# Patient Record
Sex: Female | Born: 1965 | Race: White | Hispanic: No | Smoking: Current every day smoker
Health system: Southern US, Community
[De-identification: ages and names within clinical notes are randomized; demographics above are authoritative.]

## PROBLEM LIST (undated history)

## (undated) DIAGNOSIS — G8929 Other chronic pain: Secondary | ICD-10-CM

## (undated) DIAGNOSIS — K219 Gastro-esophageal reflux disease without esophagitis: Secondary | ICD-10-CM

## (undated) DIAGNOSIS — I70201 Unspecified atherosclerosis of native arteries of extremities, right leg: Secondary | ICD-10-CM

## (undated) DIAGNOSIS — R29898 Other symptoms and signs involving the musculoskeletal system: Secondary | ICD-10-CM

## (undated) DIAGNOSIS — I10 Essential (primary) hypertension: Secondary | ICD-10-CM

## (undated) DIAGNOSIS — M549 Dorsalgia, unspecified: Secondary | ICD-10-CM

## (undated) DIAGNOSIS — E079 Disorder of thyroid, unspecified: Secondary | ICD-10-CM

## (undated) DIAGNOSIS — F419 Anxiety disorder, unspecified: Secondary | ICD-10-CM

## (undated) HISTORY — PX: BACK SURGERY: SHX140

## (undated) HISTORY — PX: CHOLECYSTECTOMY: SHX55

---

## 2005-08-11 ENCOUNTER — Ambulatory Visit: Payer: Self-pay | Admitting: Cardiology

## 2007-06-12 ENCOUNTER — Ambulatory Visit: Payer: Self-pay | Admitting: Gastroenterology

## 2010-01-21 ENCOUNTER — Ambulatory Visit (HOSPITAL_COMMUNITY): Admission: RE | Admit: 2010-01-21 | Discharge: 2010-01-21 | Payer: Self-pay | Admitting: Neurosurgery

## 2010-01-21 ENCOUNTER — Encounter (INDEPENDENT_AMBULATORY_CARE_PROVIDER_SITE_OTHER): Payer: Self-pay | Admitting: Neurosurgery

## 2010-01-21 ENCOUNTER — Ambulatory Visit: Payer: Self-pay | Admitting: Vascular Surgery

## 2010-04-03 HISTORY — PX: THORACIC LAMINECTOMY: SHX96

## 2010-04-16 ENCOUNTER — Encounter (INDEPENDENT_AMBULATORY_CARE_PROVIDER_SITE_OTHER): Payer: Self-pay | Admitting: Neurosurgery

## 2010-04-16 ENCOUNTER — Ambulatory Visit (HOSPITAL_COMMUNITY): Admission: RE | Admit: 2010-04-16 | Discharge: 2010-04-17 | Payer: Self-pay | Admitting: Neurosurgery

## 2010-12-20 LAB — BASIC METABOLIC PANEL
BUN: 5 mg/dL — ABNORMAL LOW (ref 6–23)
CO2: 31 mEq/L (ref 19–32)
Calcium: 9.6 mg/dL (ref 8.4–10.5)
Chloride: 102 mEq/L (ref 96–112)
Creatinine, Ser: 0.53 mg/dL (ref 0.4–1.2)
GFR calc Af Amer: >60 mL/min (ref >60)
GFR calc non Af Amer: >60 mL/min (ref >60)
Glucose, Bld: 98 mg/dL (ref 70–99)
Potassium: 3.8 mEq/L (ref 3.5–5.1)
Sodium: 140 mEq/L (ref 135–145)

## 2010-12-20 LAB — URINALYSIS, MICROSCOPIC ONLY
Bilirubin Urine: NEGATIVE
Glucose, UA: NEGATIVE mg/dL
Hgb urine dipstick: NEGATIVE
Ketones, ur: NEGATIVE mg/dL
Leukocytes, UA: NEGATIVE
Nitrite: NEGATIVE
Protein, ur: NEGATIVE mg/dL
Specific Gravity, Urine: 1.01 (ref 1.005–1.030)
Urobilinogen, UA: 0.2 mg/dL (ref 0.0–1.0)
pH: 6.5 (ref 5.0–8.0)

## 2010-12-20 LAB — SURGICAL PCR SCREEN
MRSA, PCR: NEGATIVE
Staphylococcus aureus: NEGATIVE

## 2010-12-20 LAB — URINE CULTURE
Colony Count: NO GROWTH
Culture: NO GROWTH

## 2010-12-20 LAB — CBC
HCT: 46.4 % — ABNORMAL HIGH (ref 36.0–46.0)
Hemoglobin: 15.8 g/dL — ABNORMAL HIGH (ref 12.0–15.0)
MCH: 32.1 pg (ref 26.0–34.0)
MCHC: 34.1 g/dL (ref 30.0–36.0)
MCV: 94.2 fL (ref 78.0–100.0)
Platelets: 191 10*3/uL (ref 150–400)
RBC: 4.93 MIL/uL (ref 3.87–5.11)
RDW: 12.8 % (ref 11.5–15.5)
WBC: 10.4 10*3/uL (ref 4.0–10.5)

## 2010-12-20 LAB — ABO/RH: ABO/RH(D): A NEG

## 2010-12-20 LAB — TYPE AND SCREEN
ABO/RH(D): A NEG
Antibody Screen: NEGATIVE

## 2011-01-26 ENCOUNTER — Ambulatory Visit (HOSPITAL_COMMUNITY)
Admission: RE | Admit: 2011-01-26 | Discharge: 2011-01-26 | Disposition: A | Discharge status: Home / Self Care | Payer: 59 | Source: Ambulatory Visit | Attending: Cardiology | Admitting: Cardiology

## 2011-01-26 DIAGNOSIS — R29898 Other symptoms and signs involving the musculoskeletal system: Secondary | ICD-10-CM | POA: Insufficient documentation

## 2011-01-26 DIAGNOSIS — D320 Benign neoplasm of cerebral meninges: Secondary | ICD-10-CM | POA: Insufficient documentation

## 2011-01-26 DIAGNOSIS — I10 Essential (primary) hypertension: Secondary | ICD-10-CM | POA: Insufficient documentation

## 2011-01-26 DIAGNOSIS — G8220 Paraplegia, unspecified: Secondary | ICD-10-CM | POA: Insufficient documentation

## 2011-02-09 NOTE — Procedures (Signed)
  NAME:  Briana Mosley, LAMPRON            ACCOUNT NO.:  [id number]  MEDICAL RECORD NO.:  [id number]           PATIENT TYPE:  O  LOCATION:  [facility]                         FACILITY:  [facility]  PHYSICIAN:  Vonna Kotyk R. Jacinto Halim, MD       DATE OF BIRTH:  03/23/1966  DATE OF PROCEDURE:  01/26/2011 DATE OF DISCHARGE:                   PERIPHERAL VASCULAR INVASIVE PROCEDURE   PROCEDURES PERFORMED: 1. Abdominal aortogram. 2. Selective left and right renal arteriography.  INDICATIONS:  Ms. [name] is a pleasant 45 year old female who is being debilitated by a meningioma and has paraparesis and lower extremity weakness.  She had developed new onset of hypertension. Outpatient evaluation of renal arteries by [facility] had revealed high-grade bilateral renal artery stenosis.  She was referred to me for further evaluation.  After discussions of risks, benefits, alternatives, she consented proceeding with renal angiography and potential angioplasty.  Renal arteriography is being done to evaluate for renal vascular hypertension.  Abdominal aortogram.  Abdominal aortogram revealed presence of two renal arteries in the left and one renal artery on the right.  Selective right and left renal arteriogram.  Both the left renal arteries were selectively cannulated and angiography was performed. They were normal.  There was no pressure gradient.  There was no evidence of fibromuscular dysplasia.  Right renal artery.  Right renal artery is also widely patent.  There was no pressure gradient on engagement of right renal artery.  There is no evidence of fibromuscular distress.  Abdominal aortogram.  Abdominal aortogram revealed no evidence of abdominal aneurysm.  Aortoiliac bifurcation was widely patent.  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  IMPRESSION: 1. No evidence of renal artery stenosis. 2. Evaluation for renal tubular acidosis given decreased potassium is     indicated.  I will stop Benicar HCT and change  it to Benicar 20 mg     once a day and if her potassium continues to be low, she may need     further evaluation of the same.  I discussed the findings with Dr.     Doreen Beam.  The patient will follow up with him for further     management and evaluation.  TECHNIQUE OF PROCEDURE:  Under sterile precautions using a 6-French right femoral arterial access, a 5-French Omniflush catheter was advanced into the abdominal aorta and abdominal aortogram was performed. Catheter then pulled out of the body over a Versacore wire.  Using a short JR-4 guide catheter, selective renal arteriography was performed.  Catheter exchange was again done over a Versacore wire. Overall the patient tolerated the procedure well.  No complications.     Cristy Hilts. Jacinto Halim, MD     JRG/MEDQ  D:  01/26/2011  T:  01/26/2011  Job:  [id number]  cc:   Doreen Beam, MD  Electronically Signed by Yates Decamp MD on 02/09/2011 09:13:39 AM

## 2011-02-16 NOTE — Assessment & Plan Note (Signed)
NAME:  GOLDA, ZAVALZA             CHART#:  [id number]   DATE:  06/12/2007                       DOB:  1965-12-20   REQUESTING PHYSICIAN:  Brayton Mars.   REASON FOR CONSULTATION:  Weight loss and diarrhea.   HISTORY OF PRESENT ILLNESS:  Ms. Talerico is a 45 year old female who  has been having problems with diarrhea for 3 months. She has a  significant past surgical history of having her gallbladder removed in  2001. Following removal of her gallbladder she had occasional loose  stools. Now if she eats she goes to the bathroom and has watery stool.  She feels like food goes right through her as well as liquids. For this  reason she has decreased her food intake and has subsequently lost  weight. She is also going through a separation but denies that this is  causing any significant psychological stress. She denies any use of  aspirin or antiinflammatory drugs. She uses Imodium 2 to 3 times a day.  She has no abdominal pain associated with these loose stools. She has  lost anywhere between 10 to 20 pounds in the last 3 months. She wants to  eat but does not. She has not been started on any new medicines. She has  not been taking her thyroid medicine for the last 6 months. She denies  any heat intolerance.  She denies any money problems or any stress with  her living situation.   She has not had any rectal bleeding, nausea, vomiting, heartburn or  problem swallowing. She denies any travel, well water or sick contacts.  She has not had any bowel surgery.   PAST MEDICAL HISTORY:  1. Hypothyroidism.  2. Anxiety.  3. Depression.   PAST SURGICAL HISTORY:  Refer to the HPI.   FAMILY HISTORY:  She denies any family history of colon cancer, colon  polyps. Her mother had lung cancer and her father had a stroke.   SOCIAL HISTORY:  She is separated for the last 3 months and has a 60-  year-old son. 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  She denies any exposure to  patient. She smokes 2 packs a day. She may  have a beer 1 or 2 times a week. She grew up in [place], [place].   REVIEW OF SYSTEMS:  As per the HPI; otherwise all systems are negative.   PHYSICAL EXAMINATION:  VITAL SIGNS: Weight 137 pounds, height 5 feet 5  inches; BMI 22.8 (healthy). Temperature is 98.4, blood pressure 110/80,  pulse 72.  GENERAL APPEARANCE: In general she is in no apparent distress. Alert and  oriented x4.  HEENT: Her exam is normocephalic, atraumatic. Pupils equal and react to  light. Mouth, no oral lesions. Posterior oropharynx without erythema or  exudate. Her mouth has full range of motion; no lymphadenopathy.  LUNGS: Her lungs are clear to auscultation bilaterally.  CARDIOVASCULAR: Exam shows regular rhythm. No murmur, normal S1 and S2.  ABDOMEN: Bowel sounds are present, soft, nontender, nondistended. No  rebound or guarding. No hepatosplenomegaly, abdominal bruits or  pulsatile masses.  EXTREMITIES: The extremities are without clubbing, cyanosis or edema.   ASSESSMENT:  Ms. Hindle is a 45 year old female with chronic diarrhea.  The differential diagnosis includes thyroid disturbance, Clostridium  difficile colitis, giardiasis and a low likelihood of  inflammatory bowel  disease, celiac sprue or steatorrhea. She could have bile-salt induced  diarrhea. Thank you for allowing me to see Ms. Janyth Pupa in consultation;  my recommendations follow   RECOMMENDATIONS:  1. She is given instructions for the management of her diarrhea in      writing. She should continue the Imodium as needed or she is given      a prescription for Levsin 0.125 mg tablets which she may use 1 to 2      thirty minutes before meals and repeat every 4 hours. She is      cautioned that they may cause dry mouth, drowsiness and urinary      retention.  2. She is also asked to begin 2 Tums with meals which may cause      bloating or constipation.  3. She is asked to add a probiotic  (lactobacillus) daily.  4. She should call in 2 weeks to let me know how she is doing.  5. Today I will obtain the tissue transglutaminase, serum quantitative      immunoglobulin, and a TSH.  6. She needs stool for fat, white cells, Giardia antigen and C. diff      toxin.  7. She has a followup appointment in 1 month.       Kassie Mends, M.D.  Electronically Signed     SM/MEDQ  D:  06/12/2007  T:  06/13/2007  Job:  [id number]   cc:   Yetta Numbers

## 2011-11-29 ENCOUNTER — Inpatient Hospital Stay (HOSPITAL_COMMUNITY)
Admission: EM | Admit: 2011-11-29 | Discharge: 2011-12-07 | DRG: 689 | Disposition: A | Discharge status: Skilled Nursing Facility | Payer: Medicaid Other | Attending: Internal Medicine | Admitting: Internal Medicine

## 2011-11-29 ENCOUNTER — Emergency Department (HOSPITAL_COMMUNITY): Payer: Medicaid Other

## 2011-11-29 ENCOUNTER — Encounter (HOSPITAL_COMMUNITY): Payer: Self-pay | Admitting: *Deleted

## 2011-11-29 ENCOUNTER — Inpatient Hospital Stay (HOSPITAL_COMMUNITY): Payer: Medicaid Other

## 2011-11-29 DIAGNOSIS — T3795XA Adverse effect of unspecified systemic anti-infective and antiparasitic, initial encounter: Secondary | ICD-10-CM | POA: Diagnosis present

## 2011-11-29 DIAGNOSIS — Z993 Dependence on wheelchair: Secondary | ICD-10-CM

## 2011-11-29 DIAGNOSIS — I10 Essential (primary) hypertension: Secondary | ICD-10-CM | POA: Diagnosis present

## 2011-11-29 DIAGNOSIS — G8929 Other chronic pain: Secondary | ICD-10-CM | POA: Diagnosis present

## 2011-11-29 DIAGNOSIS — R269 Unspecified abnormalities of gait and mobility: Secondary | ICD-10-CM | POA: Diagnosis present

## 2011-11-29 DIAGNOSIS — G9340 Encephalopathy, unspecified: Secondary | ICD-10-CM | POA: Diagnosis present

## 2011-11-29 DIAGNOSIS — R29898 Other symptoms and signs involving the musculoskeletal system: Secondary | ICD-10-CM | POA: Diagnosis present

## 2011-11-29 DIAGNOSIS — R410 Disorientation, unspecified: Secondary | ICD-10-CM | POA: Diagnosis present

## 2011-11-29 DIAGNOSIS — R443 Hallucinations, unspecified: Secondary | ICD-10-CM | POA: Diagnosis present

## 2011-11-29 DIAGNOSIS — B952 Enterococcus as the cause of diseases classified elsewhere: Secondary | ICD-10-CM | POA: Diagnosis present

## 2011-11-29 DIAGNOSIS — Z79899 Other long term (current) drug therapy: Secondary | ICD-10-CM

## 2011-11-29 DIAGNOSIS — M549 Dorsalgia, unspecified: Secondary | ICD-10-CM | POA: Diagnosis present

## 2011-11-29 DIAGNOSIS — E039 Hypothyroidism, unspecified: Secondary | ICD-10-CM | POA: Diagnosis present

## 2011-11-29 DIAGNOSIS — N390 Urinary tract infection, site not specified: Principal | ICD-10-CM | POA: Diagnosis present

## 2011-11-29 DIAGNOSIS — E876 Hypokalemia: Secondary | ICD-10-CM | POA: Diagnosis present

## 2011-11-29 HISTORY — DX: Dorsalgia, unspecified: M54.9

## 2011-11-29 HISTORY — DX: Other symptoms and signs involving the musculoskeletal system: R29.898

## 2011-11-29 HISTORY — DX: Essential (primary) hypertension: I10

## 2011-11-29 HISTORY — DX: Disorder of thyroid, unspecified: E07.9

## 2011-11-29 HISTORY — DX: Other chronic pain: G89.29

## 2011-11-29 LAB — URINALYSIS, ROUTINE W REFLEX MICROSCOPIC
Glucose, UA: NEGATIVE mg/dL
Hgb urine dipstick: NEGATIVE
Nitrite: NEGATIVE
Protein, ur: NEGATIVE mg/dL

## 2011-11-29 LAB — BASIC METABOLIC PANEL
Potassium: 3.8 mEq/L (ref 3.5–5.1)
Sodium: 142 mEq/L (ref 135–145)

## 2011-11-29 LAB — DIFFERENTIAL
Basophils Absolute: 0 10*3/uL (ref 0.0–0.1)
Eosinophils Relative: 1 % (ref 0–5)
Lymphs Abs: 1.7 10*3/uL (ref 0.7–4.0)
Monocytes Absolute: 0.5 10*3/uL (ref 0.1–1.0)
Neutro Abs: 4.6 10*3/uL (ref 1.7–7.7)
Neutrophils Relative %: 67 % (ref 43–77)

## 2011-11-29 LAB — CBC
MCH: 31.8 pg (ref 26.0–34.0)
Platelets: 173 10*3/uL (ref 150–400)
WBC: 6.8 10*3/uL (ref 4.0–10.5)

## 2011-11-29 LAB — RAPID URINE DRUG SCREEN, HOSP PERFORMED
Benzodiazepines: POSITIVE — AB
Opiates: NOT DETECTED

## 2011-11-29 LAB — ETHANOL: Alcohol, Ethyl (B): <11 mg/dL (ref 0–11)

## 2011-11-29 MED ORDER — NICOTINE 7 MG/24HR TD PT24
7.0000 mg | MEDICATED_PATCH | Freq: Every day | TRANSDERMAL | Status: DC
  Administered 2011-11-30 – 2011-12-07 (×9): 7 mg via TRANSDERMAL
  Filled 2011-11-29 (×10): qty 1

## 2011-11-29 MED ORDER — ONDANSETRON HCL 4 MG PO TABS: 4.0000 mg | ORAL_TABLET | Freq: Four times a day (QID) | ORAL | Status: DC | PRN

## 2011-11-29 MED ORDER — ATENOLOL 25 MG PO TABS
50.0000 mg | ORAL_TABLET | Freq: Two times a day (BID) | ORAL | Status: DC
  Administered 2011-11-29 – 2011-11-30 (×3): 50 mg via ORAL
  Filled 2011-11-29 (×5): qty 1

## 2011-11-29 MED ORDER — ONDANSETRON HCL 4 MG/2ML IJ SOLN: 4.0000 mg | Freq: Four times a day (QID) | INTRAMUSCULAR | Status: DC | PRN

## 2011-11-29 MED ORDER — SODIUM CHLORIDE 0.9 % IV SOLN
INTRAVENOUS | Status: DC
  Administered 2011-11-29 – 2011-12-01 (×2): via INTRAVENOUS

## 2011-11-29 MED ORDER — OXYCODONE-ACETAMINOPHEN 5-325 MG PO TABS: 1.0000 | ORAL_TABLET | Freq: Three times a day (TID) | ORAL | Status: DC | PRN

## 2011-11-29 MED ORDER — SODIUM CHLORIDE 0.9 % IJ SOLN
INTRAMUSCULAR | Status: AC
  Administered 2011-11-29: 21:00:00
  Filled 2011-11-29: qty 3

## 2011-11-29 MED ORDER — ACETAMINOPHEN 650 MG RE SUPP: 650.0000 mg | Freq: Four times a day (QID) | RECTAL | Status: DC | PRN

## 2011-11-29 MED ORDER — ALPRAZOLAM 0.25 MG PO TABS
0.2500 mg | ORAL_TABLET | Freq: Two times a day (BID) | ORAL | Status: DC
  Administered 2011-11-29 – 2011-12-01 (×5): 0.25 mg via ORAL
  Filled 2011-11-29 (×5): qty 1

## 2011-11-29 MED ORDER — ACETAMINOPHEN 325 MG PO TABS
650.0000 mg | ORAL_TABLET | Freq: Four times a day (QID) | ORAL | Status: DC | PRN
  Administered 2011-12-05: 650 mg via ORAL
  Filled 2011-11-29: qty 2

## 2011-11-29 MED ORDER — ENOXAPARIN SODIUM 40 MG/0.4ML ~~LOC~~ SOLN
40.0000 mg | SUBCUTANEOUS | Status: DC
  Administered 2011-11-29 – 2011-11-30 (×2): 40 mg via SUBCUTANEOUS
  Filled 2011-11-29 (×2): qty 0.4

## 2011-11-29 MED ORDER — LISINOPRIL 10 MG PO TABS
20.0000 mg | ORAL_TABLET | Freq: Every day | ORAL | Status: DC
  Administered 2011-11-29 – 2011-12-04 (×6): 20 mg via ORAL
  Filled 2011-11-29 (×2): qty 2
  Filled 2011-11-29: qty 1
  Filled 2011-11-29: qty 2
  Filled 2011-11-29: qty 1
  Filled 2011-11-29 (×2): qty 2

## 2011-11-29 NOTE — ED Notes (Signed)
Pt began having hallucinations over the weekend. Pt has been confused and disoriented since. Resident of [facility]. Currently being treated for UTI

## 2011-11-29 NOTE — ED Notes (Signed)
Please call pt niece Alcario Drought [phone] when pt admitted to a room.

## 2011-11-29 NOTE — H&P (Signed)
PCP:   No primary provider on file.   Chief Complaint:  Hallucinations   HPI: This is a 46 y/o female who is currently at [facility] for physical rehabilitation after she had developed weakness in her limbs. She recently had back surgery done and is undergoing strength training.  Her niece is with her today and reports that approx 4 days ago, patient was found to have a possible UTI and started on ciprofloxacin.  Her niece reports that since then, her confusion has gotten significantly worse.  She has been seeing "shadows" infrequently.  She denies hearing any voices, suicidal/homicidal ideations.  She denies any headache, double vision, new limb weakness/numbess.  She was evaluated in the emergency room where basic work up including CT head was unrevealing.  Telepsychiatry consultation was provided and recommendations were for medical admission for observation.  It was not felt that patient's symptoms were psychiatric related.  No fever, cough, dysuria or seizure activity was reported.  Allergies:   Allergies  Allergen Reactions  . Bactrim   . Neurontin (Gabapentin)   . Sulfa Antibiotics       Past Medical History  Diagnosis Date  . Chronic back pain   . Hypertension   . Thyroid disease     hyperthyroidism  . Lower extremity weakness     Past Surgical History  Procedure Date  . Back surgery   . Cholecystectomy     Prior to Admission medications   Medication Sig Start Date End Date Taking? Authorizing Provider  ALPRAZolam Prudy Feeler) 0.5 MG tablet Take 0.5 mg by mouth 3 (three) times daily.   Yes Historical Provider, MD  atenolol (TENORMIN) 50 MG tablet Take 50 mg by mouth 2 (two) times daily.   Yes Historical Provider, MD  ciprofloxacin (CIPRO) 250 MG tablet Take 250 mg by mouth 2 (two) times daily. To take for 10 days, ending on 12/03/11   Yes Historical Provider, MD  cloNIDine (CATAPRES) 0.1 MG tablet Take 0.1 mg by mouth every 8 (eight) hours as needed. If blood pressure is  over 160   Yes Historical Provider, MD  Cyanocobalamin (VITAMIN B 12 PO) Take 1 tablet by mouth daily.   Yes Historical Provider, MD  lisinopril (PRINIVIL,ZESTRIL) 20 MG tablet Take 20 mg by mouth daily.   Yes Historical Provider, MD  losartan (COZAAR) 50 MG tablet Take 50 mg by mouth daily.   Yes Historical Provider, MD  oxyCODONE-acetaminophen (PERCOCET) 5-325 MG per tablet Take 1 tablet by mouth 3 (three) times daily as needed.   Yes Historical Provider, MD    Social History:  reports that she has been smoking.  She does not have any smokeless tobacco history on file. She reports that she does not drink alcohol or use illicit drugs.  No family history on file.  Review of Systems:  Constitutional: Denies fever, chills, diaphoresis, appetite change and fatigue.  HEENT: Denies photophobia, eye pain, redness, hearing loss, ear pain, congestion, sore throat, rhinorrhea, sneezing, mouth sores, trouble swallowing, neck pain, neck stiffness and tinnitus.   Respiratory: Denies SOB, DOE, cough, chest tightness,  and wheezing.   Cardiovascular: Denies chest pain, palpitations and leg swelling.  Gastrointestinal: Denies nausea, vomiting, abdominal pain, diarrhea, constipation, blood in stool and abdominal distention.  Genitourinary: Denies dysuria, urgency, frequency, hematuria, flank pain and difficulty urinating.  Musculoskeletal: Denies myalgias, back pain, joint swelling, arthralgias and gait problem.  Skin: Denies pallor, rash and wound.  Neurological: Denies dizziness, seizures, syncope, weakness, light-headedness, numbness and headaches.  Hematological: Denies adenopathy. Easy bruising, personal or family bleeding history  Psychiatric/Behavioral: Denies suicidal ideation, mood changes, confusion, nervousness, sleep disturbance and agitation   Physical Exam: Blood pressure 179/70, pulse 89, temperature 97.8 F (36.6 C), temperature source Oral, resp. rate 17, height 5\' 5"  (1.651 m), weight  64.864 kg (143 lb), last menstrual period 11/17/2011, SpO2 99.00%. General: No acute distress, laying in bed, alert and oriented x3,  HEENT: [place], AT, PERRLA Neck: supple Chest: CTA B Cardiac: S1, S2, RRR Abd: soft, NT, BS+ Neuro: strength is equal b/l, CN 2-12 intact, patient is at times slow to respond, and gets confused with dates, denies seeing any shadows at present.  Labs on Admission:  Results for orders placed during the hospital encounter of 11/29/11 (from the past 48 hour(s))  CBC     Status: Normal   Collection Time   11/29/11 10:01 AM      Component Value Range Comment   WBC 6.8  4.0 - 10.5 (K/uL)    RBC 4.31  3.87 - 5.11 (MIL/uL)    Hemoglobin 13.7  12.0 - 15.0 (g/dL)    HCT 16.1  09.6 - 04.5 (%)    MCV 91.6  78.0 - 100.0 (fL)    MCH 31.8  26.0 - 34.0 (pg)    MCHC 34.7  30.0 - 36.0 (g/dL)    RDW 40.9  81.1 - 91.4 (%)    Platelets 173  150 - 400 (K/uL)   DIFFERENTIAL     Status: Normal   Collection Time   11/29/11 10:01 AM      Component Value Range Comment   Neutrophils Relative 67  43 - 77 (%)    Neutro Abs 4.6  1.7 - 7.7 (K/uL)    Lymphocytes Relative 25  12 - 46 (%)    Lymphs Abs 1.7  0.7 - 4.0 (K/uL)    Monocytes Relative 7  3 - 12 (%)    Monocytes Absolute 0.5  0.1 - 1.0 (K/uL)    Eosinophils Relative 1  0 - 5 (%)    Eosinophils Absolute 0.1  0.0 - 0.7 (K/uL)    Basophils Relative 1  0 - 1 (%)    Basophils Absolute 0.0  0.0 - 0.1 (K/uL)   BASIC METABOLIC PANEL     Status: Abnormal   Collection Time   11/29/11 10:01 AM      Component Value Range Comment   Sodium 142  135 - 145 (mEq/L)    Potassium 3.8  3.5 - 5.1 (mEq/L)    Chloride 103  96 - 112 (mEq/L)    CO2 29  19 - 32 (mEq/L)    Glucose, Bld 97  70 - 99 (mg/dL)    BUN 16  6 - 23 (mg/dL)    Creatinine, Ser 7.82 (*) 0.50 - 1.10 (mg/dL)    Calcium 95.6  8.4 - 10.5 (mg/dL)    GFR calc non Af Amer >90  >90 (mL/min)    GFR calc Af Amer >90  >90 (mL/min)   ETHANOL     Status: Normal   Collection Time    11/29/11 10:51 AM      Component Value Range Comment   Alcohol, Ethyl (B) <11  0 - 11 (mg/dL)   URINALYSIS, ROUTINE W REFLEX MICROSCOPIC     Status: Abnormal   Collection Time   11/29/11 11:42 AM      Component Value Range Comment   Color, Urine YELLOW  YELLOW  APPearance CLEAR  CLEAR     Specific Gravity, Urine 1.025  1.005 - 1.030     pH 6.0  5.0 - 8.0     Glucose, UA NEGATIVE  NEGATIVE (mg/dL)    Hgb urine dipstick NEGATIVE  NEGATIVE     Bilirubin Urine NEGATIVE  NEGATIVE     Ketones, ur TRACE (*) NEGATIVE (mg/dL)    Protein, ur NEGATIVE  NEGATIVE (mg/dL)    Urobilinogen, UA 0.2  0.0 - 1.0 (mg/dL)    Nitrite NEGATIVE  NEGATIVE     Leukocytes, UA NEGATIVE  NEGATIVE  MICROSCOPIC NOT DONE ON URINES WITH NEGATIVE PROTEIN, BLOOD, LEUKOCYTES, NITRITE, OR GLUCOSE <1000 mg/dL.  URINE RAPID DRUG SCREEN (HOSP PERFORMED)     Status: Abnormal   Collection Time   11/29/11 11:42 AM      Component Value Range Comment   Opiates NONE DETECTED  NONE DETECTED     Cocaine NONE DETECTED  NONE DETECTED     Benzodiazepines POSITIVE (*) NONE DETECTED     Amphetamines NONE DETECTED  NONE DETECTED     Tetrahydrocannabinol NONE DETECTED  NONE DETECTED     Barbiturates NONE DETECTED  NONE DETECTED      Radiological Exams on Admission: Ct Head Wo Contrast  11/29/2011  *RADIOLOGY REPORT*  Clinical Data: Summation ends.  Insomnia.  Hyperthyroidism.  CT HEAD WITHOUT CONTRAST  Technique:  Contiguous axial images were obtained from the base of the skull through the vertex without contrast.  Comparison: None.  Findings: The brain has a normal appearance without evidence of malformation, old or acute small or large vessel infarction, mass lesion, hemorrhage, hydrocephalus or extra-axial collection.  The calvarium is unremarkable.  Sinuses, middle ears and mastoids are clear.  IMPRESSION: Normal head CT  Original Report Authenticated By: Thomasenia Sales, M.D.    Assessment/Plan Principal Problem:   *Hallucinations Active Problems:  Confusion  Hypertension  Back pain  Plan:  It is unclear as to the cause of the patient's mental status She will be admitted to a medical bed for observation We will check an MRI to rule out any underlying pathology which may be causing this We will also check TSH, ammonia Stop cipro, since her symptoms became worse after starting this If mental status does not improve, then we can consider neurology consultation She is on chronic benzos and opiates.  We will decrease the dose of benzos and monitor We will continue her antihypertensives and observe  Once mental status improves, she will return to [facility]  Time Spent on Admission:  Illianna Paschal [facility] Pager: [phone] 11/29/2011, 7:07 PM

## 2011-11-29 NOTE — ED Notes (Signed)
Telepsychiatric papers faxed to [facility] called by RN-spoke to [facility].

## 2011-11-29 NOTE — ED Provider Notes (Signed)
History   This chart was scribed for [employer]. Colon Branch, MD by Sofie Rower. The patient was seen in room APA14/APA14 and the patient's care was started at 10:42AM.    CSN: [id number]  Arrival date & time 11/29/11  7829   First MD Initiated Contact with Patient 11/29/11 [phone]      Chief Complaint  Patient presents with  . Altered Mental Status    (Consider location/radiation/quality/duration/timing/severity/associated sxs/prior treatment) HPI  Briana Mosley is a 46 y.o. female who, brought in by her niece, presents to the Emergency Department complaining of severe, episodic hallucinations onset four days ago with associated symptoms of loss of sleep. Pt caretaker states pt has "had visual hallucinations since Thursday, progressing worse to not knowing what day it is, whether it is day or night". Pt states she "is having hallucinations of people from the present and the past who she cannot speak to". Pt is a resident at [facility]. Pt uses a walker while doing therapy at [facility].   Pt has a hx of back problems. Pt started taking Cipro Thursday night. Pt is currently being treated for UTI.  Pt recently moved out of an apartment where she had been living with her son.     PCP is Dr. Sherril Croon  Past Medical History  Diagnosis Date  . Chronic back pain   . Hypertension   . Thyroid disease     hyperthyroidism  . Lower extremity weakness     Past Surgical History  Procedure Date  . Back surgery   . Cholecystectomy     No family history on file.  History  Substance Use Topics  . Smoking status: Current Everyday Smoker  . Smokeless tobacco: Not on file  . Alcohol Use: No    OB History    Grav Para Term Preterm Abortions TAB SAB Ect Mult Living                  Review of Systems  All other systems reviewed and are negative.    10 Systems reviewed and are negative for acute change except as noted in the HPI.  Allergies  Bactrim; Neurontin; and Sulfa  antibiotics  Home Medications   Current Outpatient Rx  Name Route Sig Dispense Refill  . ALPRAZOLAM 0.5 MG PO TABS Oral Take 0.5 mg by mouth 3 (three) times daily.    . ATENOLOL 50 MG PO TABS Oral Take 50 mg by mouth 2 (two) times daily.    Marland Kitchen CIPROFLOXACIN HCL 250 MG PO TABS Oral Take 250 mg by mouth 2 (two) times daily. To take for 10 days, ending on 12/03/11    . CLONIDINE HCL 0.1 MG PO TABS Oral Take 0.1 mg by mouth every 8 (eight) hours as needed. If blood pressure is over 160    . VITAMIN B 12 PO Oral Take 1 tablet by mouth daily.    Marland Kitchen LISINOPRIL 20 MG PO TABS Oral Take 20 mg by mouth daily.    Marland Kitchen LOSARTAN POTASSIUM 50 MG PO TABS Oral Take 50 mg by mouth daily.    . OXYCODONE-ACETAMINOPHEN 5-325 MG PO TABS Oral Take 1 tablet by mouth 3 (three) times daily as needed.      BP 171/73  Pulse 86  Temp(Src) 97.8 F (36.6 C) (Oral)  Resp 16  Ht 5\' 5"  (1.651 m)  Wt 143 lb (64.864 kg)  BMI 23.80 kg/m2  SpO2 100%  LMP 11/17/2011  Physical Exam  Nursing note and  vitals reviewed. Constitutional: She is oriented to person, place, and time. She appears well-developed and well-nourished.  HENT:  Head: Normocephalic and atraumatic.  Nose: Nose normal.  Eyes: Conjunctivae and EOM are normal. No scleral icterus.  Neck: Normal range of motion. Neck supple. No thyromegaly present.  Cardiovascular: Normal rate, regular rhythm and normal heart sounds.  Exam reveals no gallop and no friction rub.   No murmur heard. Pulmonary/Chest: Effort normal and breath sounds normal. No stridor. She has no wheezes. She has no rales. She exhibits no tenderness.  Abdominal: Soft. Bowel sounds are normal. She exhibits no distension. There is no tenderness. There is no rebound.  Musculoskeletal: Normal range of motion. She exhibits no edema.  Lymphadenopathy:    She has no cervical adenopathy.  Neurological: She is alert and oriented to person, place, and time. Coordination normal.  Skin: Skin is warm and  dry. No rash noted. No erythema.  Psychiatric:       Pt experiencing hallucinations. Pt states "I see people in the vents". Disoriented to place.     ED Course  Procedures (including critical care time)  DIAGNOSTIC STUDIES: Oxygen Saturation is 100% on room air, normal by my interpretation.    COORDINATION OF CARE:  Results for orders placed during the hospital encounter of 11/29/11  CBC      Component Value Range   WBC 6.8  4.0 - 10.5 (K/uL)   RBC 4.31  3.87 - 5.11 (MIL/uL)   Hemoglobin 13.7  12.0 - 15.0 (g/dL)   HCT 16.1  09.6 - 04.5 (%)   MCV 91.6  78.0 - 100.0 (fL)   MCH 31.8  26.0 - 34.0 (pg)   MCHC 34.7  30.0 - 36.0 (g/dL)   RDW 40.9  81.1 - 91.4 (%)   Platelets 173  150 - 400 (K/uL)  DIFFERENTIAL      Component Value Range   Neutrophils Relative 67  43 - 77 (%)   Neutro Abs 4.6  1.7 - 7.7 (K/uL)   Lymphocytes Relative 25  12 - 46 (%)   Lymphs Abs 1.7  0.7 - 4.0 (K/uL)   Monocytes Relative 7  3 - 12 (%)   Monocytes Absolute 0.5  0.1 - 1.0 (K/uL)   Eosinophils Relative 1  0 - 5 (%)   Eosinophils Absolute 0.1  0.0 - 0.7 (K/uL)   Basophils Relative 1  0 - 1 (%)   Basophils Absolute 0.0  0.0 - 0.1 (K/uL)  BASIC METABOLIC PANEL      Component Value Range   Sodium 142  135 - 145 (mEq/L)   Potassium 3.8  3.5 - 5.1 (mEq/L)   Chloride 103  96 - 112 (mEq/L)   CO2 29  19 - 32 (mEq/L)   Glucose, Bld 97  70 - 99 (mg/dL)   BUN 16  6 - 23 (mg/dL)   Creatinine, Ser 7.82 (*) 0.50 - 1.10 (mg/dL)   Calcium 95.6  8.4 - 10.5 (mg/dL)   GFR calc non Af Amer >90  >90 (mL/min)   GFR calc Af Amer >90  >90 (mL/min)  URINALYSIS, ROUTINE W REFLEX MICROSCOPIC      Component Value Range   Color, Urine YELLOW  YELLOW    APPearance CLEAR  CLEAR    Specific Gravity, Urine 1.025  1.005 - 1.030    pH 6.0  5.0 - 8.0    Glucose, UA NEGATIVE  NEGATIVE (mg/dL)   Hgb urine dipstick NEGATIVE  NEGATIVE  Bilirubin Urine NEGATIVE  NEGATIVE    Ketones, ur TRACE (*) NEGATIVE (mg/dL)   Protein, ur  NEGATIVE  NEGATIVE (mg/dL)   Urobilinogen, UA 0.2  0.0 - 1.0 (mg/dL)   Nitrite NEGATIVE  NEGATIVE    Leukocytes, UA NEGATIVE  NEGATIVE   ETHANOL      Component Value Range   Alcohol, Ethyl (B) <11  0 - 11 (mg/dL)  URINE RAPID DRUG SCREEN (HOSP PERFORMED)      Component Value Range   Opiates NONE DETECTED  NONE DETECTED    Cocaine NONE DETECTED  NONE DETECTED    Benzodiazepines POSITIVE (*) NONE DETECTED    Amphetamines NONE DETECTED  NONE DETECTED    Tetrahydrocannabinol NONE DETECTED  NONE DETECTED    Barbiturates NONE DETECTED  NONE DETECTED    Ct Head Wo Contrast  11/29/2011  *RADIOLOGY REPORT*  Clinical Data: Summation ends.  Insomnia.  Hyperthyroidism.  CT HEAD WITHOUT CONTRAST  Technique:  Contiguous axial images were obtained from the base of the skull through the vertex without contrast.  Comparison: None.  Findings: The brain has a normal appearance without evidence of malformation, old or acute small or large vessel infarction, mass lesion, hemorrhage, hydrocephalus or extra-axial collection.  The calvarium is unremarkable.  Sinuses, middle ears and mastoids are clear.  IMPRESSION: Normal head CT  Original Report Authenticated By: Thomasenia Sales, M.D.      10:50AM- EDP at bedside discusses treatment plan.  1455 2:56 PM:  T/C to Dr. Kerry Hough  , case discussed, including:  HPI, pertinent PM/SHx, VS/PE, dx testing, ED course and treatment.  Asked that we arrange for tele psych consult. Based on recommendation, hospitalist will admit for observation.   MDM  Patient is a resident of a nursing facility for rehab after back surgery which has left her with signficiant weakness in her arms and legs. Since Thursday she has been having visual hallucinations. No previous psychiatric history. Labs are unremarkable. Pt stable in ED with no significant deterioration in condition.[id number] Care/dispostion to Dr. Doylene Canard. Awaiting tele-psych recommendations.     I personally performed the services  described in this documentation, which was scribed in my presence. The recorded information has been reviewed and considered.   MDM Reviewed: nursing note and vitals Interpretation: labs     Nicoletta Dress. Colon Branch, MD 11/29/11 1645

## 2011-11-30 LAB — BASIC METABOLIC PANEL
CO2: 27 mEq/L (ref 19–32)
Chloride: 106 mEq/L (ref 96–112)
Creatinine, Ser: 0.43 mg/dL — ABNORMAL LOW (ref 0.50–1.10)
Glucose, Bld: 93 mg/dL (ref 70–99)

## 2011-11-30 LAB — CBC
HCT: 35.6 % — ABNORMAL LOW (ref 36.0–46.0)
MCH: 31.2 pg (ref 26.0–34.0)
MCV: 91 fL (ref 78.0–100.0)
RDW: 12.6 % (ref 11.5–15.5)
WBC: 6.1 10*3/uL (ref 4.0–10.5)

## 2011-11-30 LAB — TSH: TSH: 1.307 u[IU]/mL (ref 0.350–4.500)

## 2011-11-30 MED ORDER — POTASSIUM CHLORIDE CRYS ER 20 MEQ PO TBCR
40.0000 meq | EXTENDED_RELEASE_TABLET | Freq: Once | ORAL | Status: AC
  Administered 2011-11-30: 40 meq via ORAL
  Filled 2011-11-30: qty 2

## 2011-11-30 MED ORDER — NICOTINE 7 MG/24HR TD PT24
MEDICATED_PATCH | TRANSDERMAL | Status: AC
  Filled 2011-11-30: qty 1

## 2011-11-30 MED ORDER — SODIUM CHLORIDE 0.9 % IV BOLUS (SEPSIS)
500.0000 mL | Freq: Once | INTRAVENOUS | Status: AC
  Administered 2011-11-30: 500 mL via INTRAVENOUS

## 2011-11-30 MED ORDER — SODIUM CHLORIDE 0.9 % IJ SOLN
INTRAMUSCULAR | Status: AC
  Administered 2011-11-30
  Filled 2011-11-30: qty 3

## 2011-11-30 MED ORDER — HYDRALAZINE HCL 20 MG/ML IJ SOLN
20.0000 mg | Freq: Four times a day (QID) | INTRAMUSCULAR | Status: DC | PRN
  Administered 2011-11-30 – 2011-12-03 (×4): 20 mg via INTRAVENOUS
  Filled 2011-11-30 (×4): qty 1

## 2011-11-30 NOTE — Progress Notes (Signed)
Patient admitted from [facility]- anticipate return at d/c- full assessment to follow-  Reece Levy, MSW, [employer] [phone]

## 2011-11-30 NOTE — Progress Notes (Signed)
Subjective: Patient remains confused, still having visual hallucinations.  No reported seizure activity, she was incontinent of urine, no shortness of breath, no chest pain, no fever  Objective: Vital signs in last 24 hours: Temp:  [98 F (36.7 C)-98.6 F (37 C)] 98.4 F (36.9 C) (02/26 0700) Pulse Rate:  [72-100] 85  (02/26 0700) Resp:  [14-20] 20  (02/26 0700) BP: (115-187)/(70-95) 156/76 mmHg (02/26 0600) SpO2:  [97 %-100 %] 99 % (02/26 0700) Weight:  [60.3 kg (132 lb 15 oz)] 60.3 kg (132 lb 15 oz) (02/26 0600) Weight change:     Intake/Output from previous day: 02/25 0701 - 02/26 0700 In: 933 [P.O.:240; I.V.:693] Out: 50 [Urine:50]     Physical Exam: General: Alert, awake, oriented x3, in no acute distress, she is slow to respond, confused. HEENT: No bruits, no goiter. Heart: Regular rate and rhythm, without murmurs, rubs, gallops. Lungs: Clear to auscultation bilaterally. Abdomen: Soft, nontender, nondistended, positive bowel sounds. Extremities: No clubbing cyanosis or edema with positive pedal pulses. Neuro: Grossly intact, nonfocal.    Lab Results: Basic Metabolic Panel:  Basename 11/30/11 0432 11/29/11 1001  NA 140 142  K 3.4* 3.8  CL 106 103  CO2 27 29  GLUCOSE 93 97  BUN 11 16  CREATININE 0.43* 0.49*  CALCIUM 8.5 10.3  MG -- --  PHOS -- --   Liver Function Tests: No results found for this basename: AST:2,ALT:2,ALKPHOS:2,BILITOT:2,PROT:2,ALBUMIN:2 in the last 72 hours No results found for this basename: LIPASE:2,AMYLASE:2 in the last 72 hours  Basename 11/29/11 2248  AMMONIA 8*   CBC:  Basename 11/30/11 0432 11/29/11 1001  WBC 6.1 6.8  NEUTROABS -- 4.6  HGB 12.2 13.7  HCT 35.6* 39.5  MCV 91.0 91.6  PLT 143* 173   Cardiac Enzymes: No results found for this basename: CKTOTAL:3,CKMB:3,CKMBINDEX:3,TROPONINI:3 in the last 72 hours BNP: No results found for this basename: PROBNP:3 in the last 72 hours D-Dimer: No results found for this  basename: DDIMER:2 in the last 72 hours CBG: No results found for this basename: GLUCAP:6 in the last 72 hours Hemoglobin A1C: No results found for this basename: HGBA1C in the last 72 hours Fasting Lipid Panel: No results found for this basename: CHOL,HDL,LDLCALC,TRIG,CHOLHDL,LDLDIRECT in the last 72 hours Thyroid Function Tests: No results found for this basename: TSH,T4TOTAL,FREET4,T3FREE,THYROIDAB in the last 72 hours Anemia Panel: No results found for this basename: VITAMINB12,FOLATE,FERRITIN,TIBC,IRON,RETICCTPCT in the last 72 hours Coagulation: No results found for this basename: LABPROT:2,INR:2 in the last 72 hours Urine Drug Screen: Drugs of Abuse     Component Value Date/Time   LABOPIA NONE DETECTED 11/29/2011 1142   COCAINSCRNUR NONE DETECTED 11/29/2011 1142   LABBENZ POSITIVE* 11/29/2011 1142   AMPHETMU NONE DETECTED 11/29/2011 1142   THCU NONE DETECTED 11/29/2011 1142   LABBARB NONE DETECTED 11/29/2011 1142    Alcohol Level:  Basename 11/29/11 1051  ETH <11   Urinalysis:  Basename 11/29/11 1142  COLORURINE YELLOW  LABSPEC 1.025  PHURINE 6.0  GLUCOSEU NEGATIVE  HGBUR NEGATIVE  BILIRUBINUR NEGATIVE  KETONESUR TRACE*  PROTEINUR NEGATIVE  UROBILINOGEN 0.2  NITRITE NEGATIVE  LEUKOCYTESUR NEGATIVE    Recent Results (from the past 240 hour(s))  MRSA PCR SCREENING     Status: Normal   Collection Time   11/29/11 11:45 PM      Component Value Range Status Comment   MRSA by PCR NEGATIVE  NEGATIVE  Final     Studies/Results: Dg Chest 1 View  11/30/2011  *RADIOLOGY REPORT*  Clinical  Data: Altered mental status; weakness.  CHEST - 1 VIEW  Comparison: Chest radiograph performed 04/14/2010  Findings: The lungs are well-aerated and clear.  There is no evidence of focal opacification, pleural effusion or pneumothorax.  The heart is normal in size; the mediastinal contour is within normal limits.  No acute osseous abnormalities are seen.  IMPRESSION: No acute cardiopulmonary  process seen.  Original Report Authenticated By: Tonia Ghent, M.D.   Ct Head Wo Contrast  11/29/2011  *RADIOLOGY REPORT*  Clinical Data: Summation ends.  Insomnia.  Hyperthyroidism.  CT HEAD WITHOUT CONTRAST  Technique:  Contiguous axial images were obtained from the base of the skull through the vertex without contrast.  Comparison: None.  Findings: The brain has a normal appearance without evidence of malformation, old or acute small or large vessel infarction, mass lesion, hemorrhage, hydrocephalus or extra-axial collection.  The calvarium is unremarkable.  Sinuses, middle ears and mastoids are clear.  IMPRESSION: Normal head CT  Original Report Authenticated By: Thomasenia Sales, M.D.   Mr Brain Wo Contrast  11/30/2011  *RADIOLOGY REPORT*  Clinical Data: Altered mental status with confusion.  Difficulty standing.  Hypertension.  MRI HEAD WITHOUT CONTRAST  Technique:  Multiplanar, multiecho pulse sequences of the brain and surrounding structures were obtained according to standard protocol without intravenous contrast.  Comparison: CT head earlier in the day.  MR head 01/22/2011.  Findings: There is no evidence for acute infarction, intracranial hemorrhage, mass lesion, hydrocephalus, or extra-axial fluid. There is no atrophy or significant white matter disease.  Normal midline structures. Widely patent intracranial vasculature.  Sinuses, mastoids and orbits all appear unremarkable.  IMPRESSION: Negative.  Original Report Authenticated By: Elsie Stain, M.D.    Medications: Scheduled Meds:   . ALPRAZolam  0.25 mg Oral BID  . atenolol  50 mg Oral BID  . enoxaparin  40 mg Subcutaneous Q24H  . lisinopril  20 mg Oral Daily  . nicotine  7 mg Transdermal Daily  . sodium chloride  500 mL Intravenous Once  . sodium chloride       Continuous Infusions:   . sodium chloride 75 mL/hr at 11/30/11 0700   PRN Meds:.acetaminophen, acetaminophen, ondansetron (ZOFRAN) IV, ondansetron,  oxyCODONE-acetaminophen  Assessment/Plan:  Principal Problem:  *Hallucinations Active Problems:  Confusion  Hypertension  Back pain  Plan:  This lady was admitted from the nursing home with confusion and hallucinations for the last 4-5 days.  She was recently started on cipro 5 days ago for presumed UTI.  After she was started on cipro, her mental status got worse.  She was admitted to the hospital for observation and further work up.  Telepsychiatry did not feel that her symptoms were psych related. CT head/MRI brain/Chest xray are negative.  She did have a urinalysis done in the ED which did not show signs of infection.  She has also been on cipro for the last 5 days, so I do not expect her u/a or culture to be positive. Her cipro was discontinued as this may have been causing her symptoms, but she has not had significant improvement with this. Her ammonia is normal, and TSH is currently pending. Her benzo's and opiates were decreased as well, but she has been on these chronically and are less likely to be causing her current symptoms.  We will ask neurology for further recommendations.   LOS: 1 day   Agustina Witzke [facility] Pager: [phone] 11/30/2011, 10:18 AM

## 2011-12-01 ENCOUNTER — Inpatient Hospital Stay (HOSPITAL_COMMUNITY)
Admit: 2011-12-01 | Discharge: 2011-12-01 | Disposition: A | Discharge status: Home / Self Care | Payer: Medicaid Other | Attending: Neurology | Admitting: Neurology

## 2011-12-01 MED ORDER — ATENOLOL 25 MG PO TABS
100.0000 mg | ORAL_TABLET | Freq: Two times a day (BID) | ORAL | Status: DC
  Administered 2011-12-01 – 2011-12-05 (×9): 100 mg via ORAL
  Filled 2011-12-01 (×8): qty 4
  Filled 2011-12-01: qty 1

## 2011-12-01 MED ORDER — VITAMIN B-1 100 MG PO TABS
100.0000 mg | ORAL_TABLET | Freq: Every day | ORAL | Status: DC
  Administered 2011-12-01 – 2011-12-07 (×7): 100 mg via ORAL
  Filled 2011-12-01 (×7): qty 1

## 2011-12-01 MED ORDER — POTASSIUM CHLORIDE CRYS ER 20 MEQ PO TBCR
40.0000 meq | EXTENDED_RELEASE_TABLET | Freq: Once | ORAL | Status: AC
  Administered 2011-12-01: 40 meq via ORAL
  Filled 2011-12-01: qty 2

## 2011-12-01 NOTE — Evaluation (Signed)
Physical Therapy Evaluation Patient Details Name: Briana Mosley MRN: [id number] DOB: July 26, 1966 Today's Date: 12/01/2011  Problem List:  Patient Active Problem List  Diagnoses  . Hallucinations  . Confusion  . Hypertension  . Back pain    Past Medical History:  Past Medical History  Diagnosis Date  . Chronic back pain   . Hypertension   . Thyroid disease     hyperthyroidism  . Lower extremity weakness    Past Surgical History:  Past Surgical History  Procedure Date  . Back surgery   . Cholecystectomy     PT Assessment/Plan/Recommendation PT Assessment Clinical Impression Statement: pt coopertive but anxious...strength is WNL but has strong extensor tone in both LEs which is uncontrollable in standing...she states that she is able to stand with better control when she has her shoes on...unfortunately, she has no shoes here in the hospital..Marland KitchenI am going to recommend that we continue PT in the rehab setting where they have the appropriate equipment to work with a pt having these problems PT Recommendation/Assessment: All further PT needs can be met in the next venue of care PT Problem List: Decreased coordination;Decreased balance;Decreased mobility;Decreased cognition;Impaired tone PT Therapy Diagnosis : Difficulty walking;Abnormality of gait PT Recommendation Follow Up Recommendations: Skilled nursing facility Equipment Recommended: Defer to next venue PT Goals     PT Evaluation Precautions/Restrictions  Precautions Precautions: Fall Required Braces or Orthoses: No Restrictions Weight Bearing Restrictions: No Prior Functioning  Home Living Type of Home: Skilled Nursing Facility Additional Comments: pt does not seem to recall what her residence was prior to SNF Prior Function Level of Independence: Needs assistance with gait;Needs assistance with tranfers Driving: No Vocation: On disability Cognition Cognition Arousal/Alertness: Awake/alert Overall  Cognitive Status: Appears within functional limits for tasks assessed Orientation Level: Oriented to person;Oriented to place;Oriented to time Cognition - Other Comments: pt is very nervous and feels confused as to what is going on around her Sensation/Coordination Sensation Light Touch: Appears Intact Stereognosis: Not tested Hot/Cold: Not tested Proprioception: Impaired by gross assessment Coordination Gross Motor Movements are Fluid and Coordinated: No Fine Motor Movements are Fluid and Coordinated: No Coordination and Movement Description: intention tremor LUE, strong extensor tone in LEs Heel Shin Test: unable to perform  Extremity Assessment RUE Assessment RUE Assessment: Within Functional Limits LUE Assessment LUE Assessment: Within Functional Limits RLE Assessment RLE Assessment: Within Functional Limits LLE Assessment LLE Assessment: Within Functional Limits Mobility (including Balance) Bed Mobility Bed Mobility: Yes Supine to Sit: 5: Supervision Supine to Sit Details (indicate cue type and reason): has difficulty controlling LEs to get to sitting position Sit to Supine: 5: Supervision Transfers Transfers: Yes Sit to Stand: 2: Max assist;With upper extremity assist Sit to Stand Details (indicate cue type and reason): able to push up from bed, but needs assist to maintain flexed knees at push off and then is unable to bear weight on RLE due to severe tone Stand to Sit: 2: Max assist Ambulation/Gait Ambulation/Gait: No (unable-very fearful of sliding on floor-shoes unavailable) Stairs: No Wheelchair Mobility Wheelchair Mobility: No  Posture/Postural Control Posture/Postural Control: No significant limitations Balance Balance Assessed: Yes Static Sitting Balance Static Sitting - Level of Assistance: 6: Modified independent (Device/Increase time) Dynamic Sitting Balance Dynamic Sitting - Level of Assistance: 6: Modified independent (Device/Increase time) Static  Standing Balance Static Standing - Level of Assistance: 3: Mod assist Exercise    End of Session PT - End of Session Equipment Utilized During Treatment: Gait belt Activity Tolerance: Patient tolerated treatment  well Patient left: in bed;with call bell in reach;with bed alarm set Nurse Communication: Mobility status for transfers;Mobility status for ambulation General Behavior During Session: [facility] for tasks performed Cognition: [facility] for tasks performed  Konrad Penta 12/01/2011, 1:17 PM

## 2011-12-01 NOTE — Consult Note (Signed)
NAMEJOSELLE, DEEDS            ACCOUNT NO.:  [id number]  MEDICAL RECORD NO.:  [id number]  LOCATION:  A329                          FACILITY:  [facility]  PHYSICIAN:  Zhana Jeangilles A. Gerilyn Pilgrim, M.D. DATE OF BIRTH:  10/31/1965  DATE OF CONSULTATION: DATE OF DISCHARGE:                                CONSULTATION   HISTORY OF PRESENT ILLNESS:  This is a 46 year old white female who presented from [facility] with a relatively acute encephalopathy over the last few days.  The history is somewhat unclear.  She has had difficulties ambulating.  Ever since, she had surgery in 2011.  The history was obtained from the niece who I did talk to over the phone. She apparently has not recovered since her surgery.  She apparently has had a some type of cyst in the back region, requiring surgery.  Since then, she has not been able to ambulate.  She essentially sits in the wheelchair.  She goes from the bed to the wheelchair on a daily basis. The family helps out with her ADLs.  She apparently went to [facility] about 2 months ago in January to get some physical therapy.  She has been doing well, has good mental status at baseline.  She is usually lucid and coherent.  She, however, developed a bladder infection, diagnosed after she started having some hallucinations last Thursday. She was started on Cipro.  It appears that concurrently she also had some changes in her antihypertensive medications.  The confusion became more progressive.  She became more agitated and having increased hallucinations.  She was taken to the hospital and has had a rather extensive evaluation, which has been mostly unrevealing.  She has imaging both with head CT scan and MRI, both have been unremarkable. Extensive labs including repeat urinalysis has been negative.  She continues to have some confusion.  Nurse reports that she has had some improvement; however, on today.  PHYSICAL EXAMINATION:  GENERAL:  She is still crying,  unclear why she is crying.  She is solely in towards the left. HEENT EVALUATION:  Head is normocephalic, atraumatic. NECK:  Supple. ABDOMEN:  Soft. EXTREMITIES:  No significant edema. NEUROLOGIC:  Mentation:  She is awake.  She is oriented to month and year.  She knows she is in the hospital.  She is really not oriented to medical condition as to why she is in the hospital.  She can provide very little history regarding her medical condition.  Speech is fine. There is no dysarthria.  Cranial nerve evaluation:  Pupils are equal, round, and reactive to light and accommodation.  Extraocular movements are full.  There is no nystagmus.  Visual fields are intact.  Facial muscle strength is symmetric.  Tongue is midline.  Uvula midline. Shoulder shrugs are normal.  Motor examination does show global weakness throughout.  She has 4+ weakness proximally right upper extremity.  Left upper extremity is also about 4.  She is weaker in the legs with the proximal strength graded as 3/5.  Distal strength is also graded as 3/5 in the legs.  This is on dorsiflexion.  Some of the testing is limited because of lack of cooperation.  Reflexes are essentially  absent in the legs except the right patella, which is trace.  Plantars are equivocal. Reflexes are 1+ in the upper extremities.  Sensation is somewhat unreliable, but she seems responds well to temperature and light touch bilaterally.  Coordination:  There is no dysmetria.  I see no tremors, no parkinsonism.  MRI is reviewed in person and shows essentially no lesions with normal scan both on diffusion imaging, FLAIR imaging, and also T1 weighted sequences.  Other labs were reviewed and essentially unremarkable.  ASSESSMENT: 1. Acute encephalopathy, unclear etiology.  I think the picture,     however, favors a toxic metabolic process especially given the     initial urine analysis, which is positive for increased leukocytes,     suspect this is a  most likely etiology. 2. Long-term gait impairment.  The examination suggest a peripheral     problem either radiculopathy or neuropathy.  We will need to get     some additional information from her old records.  RECOMMENDATIONS:  I think, we may consider doing a spinal tap.  We will therefore hold the Lovenox and consented the patient for spinal tap in the morning.  Additional recommendations will include labs for vitamin B12 level, RPR, thyroid function test, ANA, and sed rate.  An EEG will also be obtained.  We will go ahead and start the patient on thiamine. We will continue to follow the patient with you.  Thank you for this consultation.     Aviv Rota A. Gerilyn Pilgrim, M.D.     KAD/MEDQ  D:  12/01/2011  T:  12/01/2011  Job:  [id number]

## 2011-12-01 NOTE — Consult Note (Signed)
Reason for Consult: Referring Physician:   [name] is an 46 y.o. female.  HPI:   Past Medical History  Diagnosis Date  . Chronic back pain   . Hypertension   . Thyroid disease     hyperthyroidism  . Lower extremity weakness     Past Surgical History  Procedure Date  . Back surgery   . Cholecystectomy     History reviewed. No pertinent family history.  Social History:  reports that she has been smoking.  She does not have any smokeless tobacco history on file. She reports that she does not drink alcohol or use illicit drugs.  Allergies:  Allergies  Allergen Reactions  . Bactrim   . Neurontin (Gabapentin)   . Sulfa Antibiotics     Medications:  Prior to Admission medications   Medication Sig Start Date End Date Taking? Authorizing Provider  ALPRAZolam Prudy Feeler) 0.5 MG tablet Take 0.5 mg by mouth 3 (three) times daily.   Yes Historical Provider, MD  atenolol (TENORMIN) 50 MG tablet Take 50 mg by mouth 2 (two) times daily.   Yes Historical Provider, MD  ciprofloxacin (CIPRO) 250 MG tablet Take 250 mg by mouth 2 (two) times daily. To take for 10 days, ending on 12/03/11   Yes Historical Provider, MD  cloNIDine (CATAPRES) 0.1 MG tablet Take 0.1 mg by mouth every 8 (eight) hours as needed. If blood pressure is over 160   Yes Historical Provider, MD  Cyanocobalamin (VITAMIN B 12 PO) Take 1 tablet by mouth daily.   Yes Historical Provider, MD  lisinopril (PRINIVIL,ZESTRIL) 20 MG tablet Take 20 mg by mouth daily.   Yes Historical Provider, MD  losartan (COZAAR) 50 MG tablet Take 50 mg by mouth daily.   Yes Historical Provider, MD  oxyCODONE-acetaminophen (PERCOCET) 5-325 MG per tablet Take 1 tablet by mouth 3 (three) times daily as needed.   Yes Historical Provider, MD   Scheduled Meds:   . ALPRAZolam  0.25 mg Oral BID  . atenolol  100 mg Oral BID  . enoxaparin  40 mg Subcutaneous Q24H  . lisinopril  20 mg Oral Daily  . nicotine  7 mg Transdermal Daily  . potassium  chloride  40 mEq Oral Once  . potassium chloride  40 mEq Oral Once  . sodium chloride      . DISCONTD: atenolol  50 mg Oral BID   Continuous Infusions:   . DISCONTD: sodium chloride 75 mL/hr at 12/01/11 0800   PRN Meds:.acetaminophen, acetaminophen, hydrALAZINE, ondansetron (ZOFRAN) IV, ondansetron, DISCONTD: oxyCODONE-acetaminophen   Results for orders placed during the hospital encounter of 11/29/11 (from the past 48 hour(s))  CBC     Status: Normal   Collection Time   11/29/11 10:01 AM      Component Value Range Comment   WBC 6.8  4.0 - 10.5 (K/uL)    RBC 4.31  3.87 - 5.11 (MIL/uL)    Hemoglobin 13.7  12.0 - 15.0 (g/dL)    HCT 78.2  95.6 - 21.3 (%)    MCV 91.6  78.0 - 100.0 (fL)    MCH 31.8  26.0 - 34.0 (pg)    MCHC 34.7  30.0 - 36.0 (g/dL)    RDW 08.6  57.8 - 46.9 (%)    Platelets 173  150 - 400 (K/uL)   DIFFERENTIAL     Status: Normal   Collection Time   11/29/11 10:01 AM      Component Value Range Comment   Neutrophils Relative  67  43 - 77 (%)    Neutro Abs 4.6  1.7 - 7.7 (K/uL)    Lymphocytes Relative 25  12 - 46 (%)    Lymphs Abs 1.7  0.7 - 4.0 (K/uL)    Monocytes Relative 7  3 - 12 (%)    Monocytes Absolute 0.5  0.1 - 1.0 (K/uL)    Eosinophils Relative 1  0 - 5 (%)    Eosinophils Absolute 0.1  0.0 - 0.7 (K/uL)    Basophils Relative 1  0 - 1 (%)    Basophils Absolute 0.0  0.0 - 0.1 (K/uL)   BASIC METABOLIC PANEL     Status: Abnormal   Collection Time   11/29/11 10:01 AM      Component Value Range Comment   Sodium 142  135 - 145 (mEq/L)    Potassium 3.8  3.5 - 5.1 (mEq/L)    Chloride 103  96 - 112 (mEq/L)    CO2 29  19 - 32 (mEq/L)    Glucose, Bld 97  70 - 99 (mg/dL)    BUN 16  6 - 23 (mg/dL)    Creatinine, Ser 1.61 (*) 0.50 - 1.10 (mg/dL)    Calcium 09.6  8.4 - 10.5 (mg/dL)    GFR calc non Af Amer >90  >90 (mL/min)    GFR calc Af Amer >90  >90 (mL/min)   ETHANOL     Status: Normal   Collection Time   11/29/11 10:51 AM      Component Value Range Comment    Alcohol, Ethyl (B) <11  0 - 11 (mg/dL)   URINALYSIS, ROUTINE W REFLEX MICROSCOPIC     Status: Abnormal   Collection Time   11/29/11 11:42 AM      Component Value Range Comment   Color, Urine YELLOW  YELLOW     APPearance CLEAR  CLEAR     Specific Gravity, Urine 1.025  1.005 - 1.030     pH 6.0  5.0 - 8.0     Glucose, UA NEGATIVE  NEGATIVE (mg/dL)    Hgb urine dipstick NEGATIVE  NEGATIVE     Bilirubin Urine NEGATIVE  NEGATIVE     Ketones, ur TRACE (*) NEGATIVE (mg/dL)    Protein, ur NEGATIVE  NEGATIVE (mg/dL)    Urobilinogen, UA 0.2  0.0 - 1.0 (mg/dL)    Nitrite NEGATIVE  NEGATIVE     Leukocytes, UA NEGATIVE  NEGATIVE  MICROSCOPIC NOT DONE ON URINES WITH NEGATIVE PROTEIN, BLOOD, LEUKOCYTES, NITRITE, OR GLUCOSE <1000 mg/dL.  URINE RAPID DRUG SCREEN (HOSP PERFORMED)     Status: Abnormal   Collection Time   11/29/11 11:42 AM      Component Value Range Comment   Opiates NONE DETECTED  NONE DETECTED     Cocaine NONE DETECTED  NONE DETECTED     Benzodiazepines POSITIVE (*) NONE DETECTED     Amphetamines NONE DETECTED  NONE DETECTED     Tetrahydrocannabinol NONE DETECTED  NONE DETECTED     Barbiturates NONE DETECTED  NONE DETECTED    AMMONIA     Status: Abnormal   Collection Time   11/29/11 10:48 PM      Component Value Range Comment   Ammonia 8 (*) 11 - 60 (umol/L)   TSH     Status: Normal   Collection Time   11/29/11 10:53 PM      Component Value Range Comment   TSH 1.307  0.350 - 4.500 (uIU/mL)   MRSA PCR  SCREENING     Status: Normal   Collection Time   11/29/11 11:45 PM      Component Value Range Comment   MRSA by PCR NEGATIVE  NEGATIVE    BASIC METABOLIC PANEL     Status: Abnormal   Collection Time   11/30/11  4:32 AM      Component Value Range Comment   Sodium 140  135 - 145 (mEq/L)    Potassium 3.4 (*) 3.5 - 5.1 (mEq/L)    Chloride 106  96 - 112 (mEq/L)    CO2 27  19 - 32 (mEq/L)    Glucose, Bld 93  70 - 99 (mg/dL)    BUN 11  6 - 23 (mg/dL)    Creatinine, Ser 9.81 (*) 0.50  - 1.10 (mg/dL)    Calcium 8.5  8.4 - 10.5 (mg/dL)    GFR calc non Af Amer >90  >90 (mL/min)    GFR calc Af Amer >90  >90 (mL/min)   CBC     Status: Abnormal   Collection Time   11/30/11  4:32 AM      Component Value Range Comment   WBC 6.1  4.0 - 10.5 (K/uL)    RBC 3.91  3.87 - 5.11 (MIL/uL)    Hemoglobin 12.2  12.0 - 15.0 (g/dL)    HCT 19.1 (*) 47.8 - 46.0 (%)    MCV 91.0  78.0 - 100.0 (fL)    MCH 31.2  26.0 - 34.0 (pg)    MCHC 34.3  30.0 - 36.0 (g/dL)    RDW 29.5  62.1 - 30.8 (%)    Platelets 143 (*) 150 - 400 (K/uL)     Dg Chest 1 View  11/30/2011  *RADIOLOGY REPORT*  Clinical Data: Altered mental status; weakness.  CHEST - 1 VIEW  Comparison: Chest radiograph performed 04/14/2010  Findings: The lungs are well-aerated and clear.  There is no evidence of focal opacification, pleural effusion or pneumothorax.  The heart is normal in size; the mediastinal contour is within normal limits.  No acute osseous abnormalities are seen.  IMPRESSION: No acute cardiopulmonary process seen.  Original Report Authenticated By: Tonia Ghent, M.D.   Ct Head Wo Contrast  11/29/2011  *RADIOLOGY REPORT*  Clinical Data: Summation ends.  Insomnia.  Hyperthyroidism.  CT HEAD WITHOUT CONTRAST  Technique:  Contiguous axial images were obtained from the base of the skull through the vertex without contrast.  Comparison: None.  Findings: The brain has a normal appearance without evidence of malformation, old or acute small or large vessel infarction, mass lesion, hemorrhage, hydrocephalus or extra-axial collection.  The calvarium is unremarkable.  Sinuses, middle ears and mastoids are clear.  IMPRESSION: Normal head CT  Original Report Authenticated By: Thomasenia Sales, M.D.   Mr Brain Wo Contrast  11/30/2011  *RADIOLOGY REPORT*  Clinical Data: Altered mental status with confusion.  Difficulty standing.  Hypertension.  MRI HEAD WITHOUT CONTRAST  Technique:  Multiplanar, multiecho pulse sequences of the brain and  surrounding structures were obtained according to standard protocol without intravenous contrast.  Comparison: CT head earlier in the day.  MR head 01/22/2011.  Findings: There is no evidence for acute infarction, intracranial hemorrhage, mass lesion, hydrocephalus, or extra-axial fluid. There is no atrophy or significant white matter disease.  Normal midline structures. Widely patent intracranial vasculature.  Sinuses, mastoids and orbits all appear unremarkable.  IMPRESSION: Negative.  Original Report Authenticated By: Elsie Stain, M.D.    Review of Systems  Unable  to perform ROS: mental status change   Blood pressure 184/84, pulse 75, temperature 98.4 F (36.9 C), temperature source Oral, resp. rate 16, height 5\' 5"  (1.651 m), weight 60.3 kg (132 lb 15 oz), last menstrual period 11/17/2011, SpO2 100.00%. Physical Exam  Assessment/Plan: See dictation  Briana Mosley 12/01/2011, 8:55 AM

## 2011-12-01 NOTE — Progress Notes (Signed)
Report given to V. Dildy, LPN on Dept 161.  Pt will be transferred to room 329 via bed.  Pt is alert and oriented to self and place.  Pt's niece Alcario Drought called and she was updated on the plan for the pt and she was made aware of transfer to room 329.  Pt's belongings taken to new room with pt.

## 2011-12-01 NOTE — Progress Notes (Signed)
EEG performed, portable.

## 2011-12-01 NOTE — Progress Notes (Signed)
Subjective: This lady was admitted with hallucinations from [facility]. She herself cannot really tell much about why she was at [facility] We do know that in April 2012 she underwent abdominal aortogram and selective left and right renal arteriography for new onset of hypertension. 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           Physical Exam: Blood pressure 184/84, pulse 75, temperature 98.4 F (36.9 C), temperature source Oral, resp. rate 16, height 5\' 5"  (1.651 m), weight 60.3 kg (132 lb 15 oz), last menstrual period 11/17/2011, SpO2 100.00%. She looks systemically well but she appears somewhat delirious. She is slightly fidgety. She does not appear to have any focal neurological signs. Her cognition is rather poor and her attention span is also poor. Heart sounds are present and normal. Lung fields are clear. Abdomen is soft nontender.   Investigations:  Recent Results (from the past 240 hour(s))  MRSA PCR SCREENING     Status: Normal   Collection Time   11/29/11 11:45 PM      Component Value Range Status Comment   MRSA by PCR NEGATIVE  NEGATIVE  Final      Basic Metabolic Panel:  Basename 11/30/11 0432 11/29/11 1001  NA 140 142  K 3.4* 3.8  CL 106 103  CO2 27 29  GLUCOSE 93 97  BUN 11 16  CREATININE 0.43* 0.49*  CALCIUM 8.5 10.3  MG -- --  PHOS -- --       CBC:  Basename 11/30/11 0432 11/29/11 1001  WBC 6.1 6.8  NEUTROABS -- 4.6  HGB 12.2 13.7  HCT 35.6* 39.5  MCV 91.0 91.6  PLT 143* 173    Dg Chest 1 View  11/30/2011  *RADIOLOGY REPORT*  Clinical Data: Altered mental status; weakness.  CHEST - 1 VIEW  Comparison: Chest radiograph performed 04/14/2010  Findings: The lungs are well-aerated and clear.  There is no evidence of focal opacification, pleural effusion or pneumothorax.  The heart is normal in size; the mediastinal contour is within normal limits.  No acute osseous abnormalities are  seen.  IMPRESSION: No acute cardiopulmonary process seen.  Original Report Authenticated By: Tonia Ghent, M.D.   Ct Head Wo Contrast  11/29/2011  *RADIOLOGY REPORT*  Clinical Data: Summation ends.  Insomnia.  Hyperthyroidism.  CT HEAD WITHOUT CONTRAST  Technique:  Contiguous axial images were obtained from the base of the skull through the vertex without contrast.  Comparison: None.  Findings: The brain has a normal appearance without evidence of malformation, old or acute small or large vessel infarction, mass lesion, hemorrhage, hydrocephalus or extra-axial collection.  The calvarium is unremarkable.  Sinuses, middle ears and mastoids are clear.  IMPRESSION: Normal head CT  Original Report Authenticated By: Thomasenia Sales, M.D.   Mr Brain Wo Contrast  11/30/2011  *RADIOLOGY REPORT*  Clinical Data: Altered mental status with confusion.  Difficulty standing.  Hypertension.  MRI HEAD WITHOUT CONTRAST  Technique:  Multiplanar, multiecho pulse sequences of the brain and surrounding structures were obtained according to standard protocol without intravenous contrast.  Comparison: CT head earlier in the day.  MR head 01/22/2011.  Findings: There is no evidence for acute infarction, intracranial hemorrhage, mass lesion, hydrocephalus, or extra-axial fluid. There is no atrophy or significant white matter disease.  Normal midline structures. Widely patent intracranial vasculature.  Sinuses, mastoids and orbits all appear unremarkable.  IMPRESSION: Negative.  Original Report Authenticated  By: Elsie Stain, M.D.      Medications: I have reviewed the patient's current medications.  Impression: 1. Delirium of unclear etiology. 2. Hypertension, uncontrolled. 3. Hypokalemia.     Plan: 1. Replete potassium. 2. Discontinue Foley catheter. 3. Discontinue IV fluids. 4. Discontinue opioids. 5. Physical therapy evaluation. 6. Agree with neurology consultation.     LOS: 2 days   Wilson Singer Pager [phone]  12/01/2011, 8:42 AM

## 2011-12-02 LAB — COMPREHENSIVE METABOLIC PANEL
AST: 16 U/L (ref 0–37)
Alkaline Phosphatase: 83 U/L (ref 39–117)
BUN: 10 mg/dL (ref 6–23)
CO2: 25 mEq/L (ref 19–32)
Chloride: 104 mEq/L (ref 96–112)
Creatinine, Ser: 0.47 mg/dL — ABNORMAL LOW (ref 0.50–1.10)
GFR calc non Af Amer: >90 mL/min (ref >90)
Total Bilirubin: 0.9 mg/dL (ref 0.3–1.2)

## 2011-12-02 LAB — CBC
HCT: 39.8 % (ref 36.0–46.0)
Hemoglobin: 13.9 g/dL (ref 12.0–15.0)
MCV: 89.8 fL (ref 78.0–100.0)
RBC: 4.43 MIL/uL (ref 3.87–5.11)
WBC: 7.1 10*3/uL (ref 4.0–10.5)

## 2011-12-02 LAB — ANTI-NUCLEAR AB-TITER (ANA TITER): ANA Titer 1: NEGATIVE

## 2011-12-02 MED ORDER — SODIUM CHLORIDE 0.9 % IJ SOLN
INTRAMUSCULAR | Status: AC
  Administered 2011-12-02: 10 mL
  Filled 2011-12-02: qty 3

## 2011-12-02 NOTE — Progress Notes (Signed)
Subjective: This lady was admitted with hallucinations from [facility]. She herself cannot really tell much about why she was at [facility]. We do know that in April 2012 she underwent abdominal aortogram and selective left and right renal arteriography for new onset of hypertension. 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 She remains confused today. She does not know where she is. Appreciate Dr. Ronal Fear input.           Physical Exam: Blood pressure 129/69, pulse 82, temperature 98 F (36.7 C), temperature source Oral, resp. rate 18, height 5\' 5"  (1.651 m), weight 60.3 kg (132 lb 15 oz), last menstrual period 11/17/2011, SpO2 100.00%. She looks systemically well but she appears somewhat delirious. She is slightly fidgety. She does not appear to have any focal neurological signs. Her cognition is rather poor and her attention span is also poor. Heart sounds are present and normal. Lung fields are clear. Abdomen is soft nontender.   Investigations:  Recent Results (from the past 240 hour(s))  MRSA PCR SCREENING     Status: Normal   Collection Time   11/29/11 11:45 PM      Component Value Range Status Comment   MRSA by PCR NEGATIVE  NEGATIVE  Final      Basic Metabolic Panel:  Basename 12/02/11 0538 11/30/11 0432  NA 139 140  K 4.0 3.4*  CL 104 106  CO2 25 27  GLUCOSE 88 93  BUN 10 11  CREATININE 0.47* 0.43*  CALCIUM 9.4 8.5  MG -- --  PHOS -- --       CBC:  Basename 12/02/11 0538 11/30/11 0432  WBC 7.1 6.1  NEUTROABS -- --  HGB 13.9 12.2  HCT 39.8 35.6*  MCV 89.8 91.0  PLT 169 143*        Medications: I have reviewed the patient's current medications.  Impression: 1. Delirium of unclear etiology. 2. Hypertension.      Plan: 1. I would agree with lumbar puncture on this lady. 2. Discontinue benzodiazepines in case this is contributing to her delirium. Urine culture has been  negative. We can repeat a urine culture again.     LOS: 3 days   Wilson Singer Pager [phone]  12/02/2011, 10:07 AM

## 2011-12-02 NOTE — Progress Notes (Signed)
Patient BP 129/69, HR 82

## 2011-12-02 NOTE — Progress Notes (Signed)
Patient BP 208/72, HR 68, prn order of hydralazine 20mg  IV given, will recheck BP

## 2011-12-02 NOTE — Progress Notes (Signed)
Patient BP 199/73, HR 74, Patient just received Atenolol 100mg  po, will recheck BP to see if effective

## 2011-12-03 ENCOUNTER — Inpatient Hospital Stay (HOSPITAL_COMMUNITY): Payer: Medicaid Other

## 2011-12-03 LAB — CSF CELL COUNT WITH DIFFERENTIAL: Tube #: 4

## 2011-12-03 LAB — BODY FLUID CELL COUNT WITH DIFFERENTIAL

## 2011-12-03 MED ORDER — SODIUM CHLORIDE 0.9 % IJ SOLN
INTRAMUSCULAR | Status: AC
  Administered 2011-12-03: 10 mL
  Filled 2011-12-03: qty 3

## 2011-12-03 NOTE — Progress Notes (Signed)
Patient incontinent of urine, tried several times to obtain urine but was unsuccessful, called Dr. Venetia Constable, new order given to do in & out cath, of urine output obtained, patient tolerated well

## 2011-12-03 NOTE — Procedures (Signed)
NAMEDHARA, SCHEPP            ACCOUNT NO.:  [id number]  MEDICAL RECORD NO.:  [id number]  LOCATION:  A329                          FACILITY:  [facility]  PHYSICIAN:  Esra Frankowski A. Gerilyn Pilgrim, M.D. DATE OF BIRTH:  April 20, 1966  DATE OF PROCEDURE:  12/01/2011 DATE OF DISCHARGE:                             EEG INTERPRETATION   Electroencephalography.  INDICATION:  A 46 year old who presents with altered mental status and confusion.  The study is being done to evaluate for nonconvulsive seizures.  MEDICATIONS:  Alprazolam, lisinopril, NicoDerm, potassium, Lovenox.  ANALYSIS:  A 16-channel recording using standard 10/20 measurements is conducted for 21 minutes.  There was a well-formed posterior dominant rhythm of 10 Hz, which attenuates with eye opening.  There is beta activity observed in frontal areas.  Awake and drowsy activities are recorded.  There is no focal or lateralized slowing.  There is no epileptiform activity.  IMPRESSION:  Normal recording of awake and drowsy states.     Kahlen Boyde A. Gerilyn Pilgrim, M.D.     KAD/MEDQ  D:  12/03/2011  T:  12/03/2011  Job:  [id number]

## 2011-12-03 NOTE — Progress Notes (Addendum)
Called to room that pt was in floor.  Upon arrival in room pt was sitting on buttocks on right side of bed missing with blankets.  Bed alarm not sounding.  Writer assessed pt's ROM and pt denied any pain and no injuries observed to skin areas.  NT obtained VS and pt was assisted back to bed with staff member assistance.  Pt reassessed in bed and no acute findings.  Bed in low position, bed alarm on, and side rails up and call bell within reach.  Pt in no distress and in stable condition.   MD made aware, received no further orders.

## 2011-12-03 NOTE — Progress Notes (Signed)
Family member present and made aware of fall earlier with no apparent injury.

## 2011-12-03 NOTE — Progress Notes (Signed)
We received a second request for PT eval today.  I saw pt on 12-01-11 and did an eval.  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  She has been receiving PT in SNF.    The problem that she has is chronic in nature and in fact we need a rehab setting to effectively work with her (mats, parallel bars, proper footwear, etc).   Her acute problems are really not related to our realm and when she worked with me she was very cooperative, able to follow all directions and focus on task.  There may be issues of which I am not aware, and if so, please let me know as I would be more than happy to see her again and try to assist in any way possible.  My pager is [phone].  Many thanks.

## 2011-12-03 NOTE — Progress Notes (Signed)
Briana Mosley, RESSEL            ACCOUNT NO.:  [id number]  MEDICAL RECORD NO.:  [id number]  LOCATION:  A329                          FACILITY:  [facility]  PHYSICIAN:  Ashan Cueva A. Gerilyn Pilgrim, M.D. DATE OF BIRTH:  02-26-1966  DATE OF PROCEDURE: DATE OF DISCHARGE:                                PROGRESS NOTE   Nurses reported that the patient continues to have some hallucinations and confusion.  She appears to have had some improvement, however, in terms of the drowsiness.  PHYSICAL EXAMINATION:  HEENT:  Head is normocephalic, atraumatic. NECK: Supple. ABDOMEN:  Soft. EXTREMITIES:  No significant edema. BACK:  Shows mid low thoracic surgical incisional scar. 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  Cognition today, she thinks she is in [place], she knows she is in the hospital.  She does still have some nonsensical conversations from time to time.  She does follow commands well.  No dysarthria is noted.  She continues to have significant leg weakness, hip flexion on the right 4-, on the left 3.  EEG is normal.  ASSESSMENT AND PLAN:  Acute confusional state of unclear etiology.  She did present initially with urinary tract infection which possibly could be cause of this.  Otherwise nothing else uncovered.  I think it is reasonable to proceed with a spinal tap.  This should be arranged for today hopefully.     Blossom Crume A. Gerilyn Pilgrim, M.D.     KAD/MEDQ  D:  12/03/2011  T:  12/03/2011  Job:  [id number]

## 2011-12-03 NOTE — Progress Notes (Signed)
Patient BP 206/81, HR 76, prn Hydralazine 20mg  IV given for elevated BP

## 2011-12-03 NOTE — Procedures (Signed)
Preprocedure Dx: Altered Mental Status  Postprocedure Dx: Altered Mental Status Procedure:  Fluoroscopically guided lumbar puncture Radiologist:  Tyron Russell Anesthesia:  1.5 ml of 1% lidocaine Specimen:  8 ml clear colorless CSF EBL:   None Opening pressure: 20 mm H2O Complications: None

## 2011-12-03 NOTE — Progress Notes (Signed)
Subjective: Interval History:  Objective: Vital signs in last 24 hours: Temp:  [97.3 F (36.3 C)-98.5 F (36.9 C)] 98.5 F (36.9 C) (03/01 0647) Pulse Rate:  [74-95] 95  (03/01 0647) Resp:  [17-18] 18  (03/01 0647) BP: (144-206)/(66-81) 145/69 mmHg (03/01 0647) SpO2:  [95 %-98 %] 97 % (03/01 0647)  Intake/Output from previous day: 02/28 0701 - 03/01 0700 In: 340 [P.O.:340] Out: -  Intake/Output this shift:   Nutritional status: Cardiac    Lab Results:  [facility] 12/02/11 0538  WBC 7.1  HGB 13.9  HCT 39.8  PLT 169  NA 139  K 4.0  CL 104  CO2 25  GLUCOSE 88  BUN 10  CREATININE 0.47*  CALCIUM 9.4  LABA1C --   Lipid Panel No results found for this basename: CHOL,TRIG,HDL,CHOLHDL,VLDL,LDLCALC in the last 72 hours  Studies/Results: No results found.  Medications:  Scheduled Meds:   . atenolol  100 mg Oral BID  . lisinopril  20 mg Oral Daily  . nicotine  7 mg Transdermal Daily  . sodium chloride      . thiamine  100 mg Oral Daily  . DISCONTD: ALPRAZolam  0.25 mg Oral BID   Continuous Infusions:  PRN Meds:.acetaminophen, acetaminophen, hydrALAZINE, ondansetron (ZOFRAN) IV, ondansetron   Assessment/Plan: See dictation   LOS: 4 days   Jahiem Franzoni

## 2011-12-03 NOTE — Progress Notes (Signed)
Patient admitted from [facility]- plan transfer back once medically stable- Reece Levy, MSW, [place] [phone]

## 2011-12-03 NOTE — Progress Notes (Signed)
Subjective: This lady I believe is actually improving. She is more alert today. She was able to tell me that she is at [facility].           Physical Exam: Blood pressure 123/71, pulse 80, temperature 97.3 F (36.3 C), temperature source Oral, resp. rate 18, height 5\' 5"  (1.651 m), weight 60.3 kg (132 lb 15 oz), last menstrual period 11/17/2011, SpO2 97.00%. She looks systemically well but she appears somewhat delirious. She is slightly fidgety. She does not appear to have any focal neurological signs. He alert this is better today. Heart sounds are present and normal. Lung fields are clear. Abdomen is soft nontender.   Investigations:  Recent Results (from the past 240 hour(s))  MRSA PCR SCREENING     Status: Normal   Collection Time   11/29/11 11:45 PM      Component Value Range Status Comment   MRSA by PCR NEGATIVE  NEGATIVE  Final      Basic Metabolic Panel:  Basename 12/02/11 0538  NA 139  K 4.0  CL 104  CO2 25  GLUCOSE 88  BUN 10  CREATININE 0.47*  CALCIUM 9.4  MG --  PHOS --       CBC:  Basename 12/02/11 0538  WBC 7.1  NEUTROABS --  HGB 13.9  HCT 39.8  MCV 89.8  PLT 169        Medications: I have reviewed the patient's current medications.  Impression: 1. Delirium of unclear etiology, improving. 2. Hypertension.      Plan: 1. She did have lumbar puncture today and we will await the results. 2. Physical therapy evaluation and treatment.     LOS: 4 days   Wilson Singer Pager [phone]  12/03/2011, 11:59 AM

## 2011-12-04 LAB — GRAM STAIN

## 2011-12-04 MED ORDER — HEPARIN SODIUM (PORCINE) 5000 UNIT/ML IJ SOLN
5000.0000 [IU] | Freq: Three times a day (TID) | INTRAMUSCULAR | Status: DC
  Administered 2011-12-04 – 2011-12-07 (×9): 5000 [IU] via SUBCUTANEOUS
  Filled 2011-12-04 (×9): qty 1

## 2011-12-04 MED ORDER — LISINOPRIL 10 MG PO TABS
40.0000 mg | ORAL_TABLET | Freq: Every day | ORAL | Status: DC
  Administered 2011-12-04: 20 mg via ORAL
  Administered 2011-12-06: 40 mg via ORAL
  Filled 2011-12-04: qty 2
  Filled 2011-12-04: qty 4
  Filled 2011-12-04 (×2): qty 2

## 2011-12-04 NOTE — Progress Notes (Signed)
NAMEREBECA, VALDIVIA            ACCOUNT NO.:  [id number]  MEDICAL RECORD NO.:  [id number]  LOCATION:  A329                          FACILITY:  [facility]  PHYSICIAN:  Tad Fancher A. Gerilyn Pilgrim, M.D. DATE OF BIRTH:  [birth year]  DATE OF PROCEDURE: DATE OF DISCHARGE:                                PROGRESS NOTE   ADDENDUM:  Additional labs on the patient; vitamin B12 441, RPR nonreactive, homocysteine 7.1, ANA negative, and ESR 9.     Metztli Sachdev A. Gerilyn Pilgrim, M.D.     KAD/MEDQ  D:  12/03/2011  T:  12/04/2011  Job:  [id number]

## 2011-12-04 NOTE — Progress Notes (Signed)
Subjective: This lady is delirious again today and appears to have gone backwards in my opinion. Investigations so far been really negative including CSF. We have eliminated psychotropic medications. EEG is normal.          Physical Exam: Blood pressure 167/50, pulse 91, temperature 97.9 F (36.6 C), temperature source Oral, resp. rate 20, height 5\' 5"  (1.651 m), weight 60.3 kg (132 lb 15 oz), last menstrual period 11/17/2011, SpO2 94.00%. She looks systemically well but she appears somewhat delirious. She is slightly fidgety. She does not appear to have any focal neurological signs. She is alert . Heart sounds are present and normal. Lung fields are clear. Abdomen is soft nontender.   Investigations:  Recent Results (from the past 240 hour(s))  MRSA PCR SCREENING     Status: Normal   Collection Time   11/29/11 11:45 PM      Component Value Range Status Comment   MRSA by PCR NEGATIVE  NEGATIVE  Final   URINE CULTURE     Status: Normal (Preliminary result)   Collection Time   12/03/11  4:57 AM      Component Value Range Status Comment   Specimen Description URINE, CATHETERIZED   Final    Special Requests NONE   Final    Culture  Setup Time [id number]   Final    Colony Count >=100,000 COLONIES/ML   Final    Culture ENTEROCOCCUS SPECIES   Final    Report Status PENDING   Incomplete   BODY FLUID CULTURE     Status: Normal (Preliminary result)   Collection Time   12/03/11 10:35 AM      Component Value Range Status Comment   Specimen Description CSF   Final    Special Requests NONE   Final    Gram Stain     Final    Value: NO WBC SEEN     NO ORGANISMS SEEN   Culture PENDING   Incomplete    Report Status PENDING   Incomplete   GRAM STAIN     Status: Normal   Collection Time   12/03/11 10:35 AM      Component Value Range Status Comment   Specimen Description CSF   Final    Special Requests NONE   Final    Gram Stain NO ORGANISMS SEEN   Final    Report Status 12/04/2011 FINAL    Final      Basic Metabolic Panel:  Basename 12/02/11 0538  NA 139  K 4.0  CL 104  CO2 25  GLUCOSE 88  BUN 10  CREATININE 0.47*  CALCIUM 9.4  MG --  PHOS --       CBC:  Basename 12/02/11 0538  WBC 7.1  NEUTROABS --  HGB 13.9  HCT 39.8  MCV 89.8  PLT 169        Medications: I have reviewed the patient's current medications.  Impression: 1. Delirium of unclear etiology, stable for the time being but not improving to any significant degree. 2. Hypertension.      Plan: 1. Continue to monitor for the time being.     LOS: 5 days   Wilson Singer Pager [phone]  12/04/2011, 11:17 AM

## 2011-12-05 LAB — URINE CULTURE

## 2011-12-05 MED ORDER — AMOXICILLIN 250 MG PO CAPS
500.0000 mg | ORAL_CAPSULE | Freq: Three times a day (TID) | ORAL | Status: DC
  Administered 2011-12-05 – 2011-12-07 (×6): 500 mg via ORAL
  Filled 2011-12-05 (×2): qty 2
  Filled 2011-12-05: qty 1
  Filled 2011-12-05 (×3): qty 2

## 2011-12-05 MED ORDER — ATENOLOL 25 MG PO TABS
50.0000 mg | ORAL_TABLET | Freq: Two times a day (BID) | ORAL | Status: DC
  Administered 2011-12-05 – 2011-12-06 (×2): 50 mg via ORAL
  Filled 2011-12-05 (×2): qty 2

## 2011-12-05 NOTE — Progress Notes (Signed)
Notified Dr. Randol Kern of pt hr and BP low and I am holding the atenolol and lisinopril.  New orders given and followed.

## 2011-12-05 NOTE — Progress Notes (Addendum)
Subjective: This lady does appear to be better today, she is calmer, responds appropriately.          Physical Exam: Blood pressure 109/64, pulse 51, temperature 97.9 F (36.6 C), temperature source Oral, resp. rate 20, height 5\' 5"  (1.651 m), weight 60.3 kg (132 lb 15 oz), last menstrual period 11/17/2011, SpO2 97.00%. She looks systemically well but she appears somewhat delirious but less so than yesterday. She is not fidgety today. She does not appear to have any focal neurological signs. She is alert . Heart sounds are present and normal. Lung fields are clear. Abdomen is soft nontender.   Investigations:  Recent Results (from the past 240 hour(s))  MRSA PCR SCREENING     Status: Normal   Collection Time   11/29/11 11:45 PM      Component Value Range Status Comment   MRSA by PCR NEGATIVE  NEGATIVE  Final   URINE CULTURE     Status: Normal (Preliminary result)   Collection Time   12/03/11  4:57 AM      Component Value Range Status Comment   Specimen Description URINE, CATHETERIZED   Final    Special Requests NONE   Final    Culture  Setup Time [id number]   Final    Colony Count >=100,000 COLONIES/ML   Final    Culture ENTEROCOCCUS SPECIES   Final    Report Status PENDING   Incomplete   BODY FLUID CULTURE     Status: Normal (Preliminary result)   Collection Time   12/03/11 10:35 AM      Component Value Range Status Comment   Specimen Description CSF   Final    Special Requests NONE   Final    Gram Stain     Final    Value: NO WBC SEEN     NO ORGANISMS SEEN   Culture NO GROWTH   Final    Report Status PENDING   Incomplete   GRAM STAIN     Status: Normal   Collection Time   12/03/11 10:35 AM      Component Value Range Status Comment   Specimen Description CSF   Final    Special Requests NONE   Final    Gram Stain NO ORGANISMS SEEN   Final    Report Status 12/04/2011 FINAL   Final                  Medications: I have reviewed the patient's current  medications.  Impression: 1. Delirium of unclear etiology, stable for the time being, with some improvement. 2. Hypertension, much improved, borderline hypotensive. 3. Enterococcus UTI.      Plan: 1. Continue to monitor for the time being. 2. Start amoxicillin for enterococcus UTI. 3. Decrease atenolol back to 50 mg twice a day. 4. Disposition-I think she is approaching medical stability in the hospital and can be discharged back to [facility] soon in the next 1-2 days, depending on clinical condition.     LOS: 6 days   Wilson Singer Pager [phone]  12/05/2011, 8:36 AM

## 2011-12-06 ENCOUNTER — Inpatient Hospital Stay (HOSPITAL_COMMUNITY): Payer: Medicaid Other

## 2011-12-06 LAB — COMPREHENSIVE METABOLIC PANEL
Albumin: 3.5 g/dL (ref 3.5–5.2)
BUN: 25 mg/dL — ABNORMAL HIGH (ref 6–23)
Calcium: 9.6 mg/dL (ref 8.4–10.5)
Chloride: 104 mEq/L (ref 96–112)
Creatinine, Ser: 0.54 mg/dL (ref 0.50–1.10)
Total Bilirubin: 0.6 mg/dL (ref 0.3–1.2)

## 2011-12-06 LAB — CBC
HCT: 39.6 % (ref 36.0–46.0)
MCH: 31.1 pg (ref 26.0–34.0)
MCHC: 33.6 g/dL (ref 30.0–36.0)
MCV: 92.7 fL (ref 78.0–100.0)
RDW: 12.8 % (ref 11.5–15.5)
WBC: 5.9 10*3/uL (ref 4.0–10.5)

## 2011-12-06 MED ORDER — ALPRAZOLAM 0.25 MG PO TABS: 0.2500 mg | ORAL_TABLET | Freq: Two times a day (BID) | ORAL | Status: DC | PRN

## 2011-12-06 MED ORDER — HYDROCODONE-ACETAMINOPHEN 5-325 MG PO TABS: 0.5000 | ORAL_TABLET | Freq: Four times a day (QID) | ORAL | Status: DC | PRN

## 2011-12-06 MED ORDER — ATENOLOL 25 MG PO TABS
25.0000 mg | ORAL_TABLET | Freq: Two times a day (BID) | ORAL | Status: DC
  Filled 2011-12-06 (×2): qty 1

## 2011-12-06 MED ORDER — GADOBENATE DIMEGLUMINE 529 MG/ML IV SOLN
12.0000 mL | Freq: Once | INTRAVENOUS | Status: AC | PRN
  Administered 2011-12-06: 12 mL via INTRAVENOUS

## 2011-12-06 NOTE — Progress Notes (Signed)
INITIAL ADULT NUTRITION ASSESSMENT Date: 12/06/2011   Time: 12:17 PM Reason for Assessment: Length of stay  ASSESSMENT: Female 46 y.o.  Dx: Hallucinations  Hx:  Past Medical History  Diagnosis Date  . Chronic back pain   . Hypertension   . Thyroid disease     hyperthyroidism  . Lower extremity weakness    Related Meds:  Scheduled Meds:   . amoxicillin  500 mg Oral Q8H  . atenolol  50 mg Oral BID  . heparin subcutaneous  5,000 Units Subcutaneous Q8H  . lisinopril  40 mg Oral Daily  . nicotine  7 mg Transdermal Daily  . thiamine  100 mg Oral Daily  . DISCONTD: atenolol  100 mg Oral BID   Continuous Infusions:  PRN Meds:.acetaminophen, acetaminophen, gadobenate dimeglumine, gadobenate dimeglumine, hydrALAZINE, ondansetron (ZOFRAN) IV, ondansetron  Ht: 5\' 5"  (165.1 cm)  Wt: 132 lb 15 oz (60.3 kg)  Ideal Wt: 57 kg  % Ideal Wt: 105.6%  Usual Wt: 140 lb. % Usual Wt: 94.2%  BMI: 21.9 WNL  Food/Nutrition Related Hx: Patient reported appetite is all right. Documented PO intake between 0-50%. Patient denies any nutritional supplement or snack.   Labs:  CMP     Component Value Date/Time   NA 140 12/06/2011 0513   K 3.9 12/06/2011 0513   CL 104 12/06/2011 0513   CO2 27 12/06/2011 0513   GLUCOSE 97 12/06/2011 0513   BUN 25* 12/06/2011 0513   CREATININE 0.54 12/06/2011 0513   CALCIUM 9.6 12/06/2011 0513   PROT 6.9 12/06/2011 0513   ALBUMIN 3.5 12/06/2011 0513   AST 15 12/06/2011 0513   ALT 14 12/06/2011 0513   ALKPHOS 70 12/06/2011 0513   BILITOT 0.6 12/06/2011 0513   GFRNONAA >90 12/06/2011 0513   GFRAA >90 12/06/2011 0513    Intake/Output Summary (Last 24 hours) at 12/06/11 1219 Last data filed at 12/06/11 0845  Gross per 24 hour  Intake    360 ml  Output      0 ml  Net    360 ml    Diet Order: Cardiac  Supplements/Tube Feeding: none at this time  IVF:    Estimated Nutritional Needs:   Kcal: 1500-1800 Protein: 66-78 grams Fluid: 1 ml per kcal  NUTRITION  DIAGNOSIS: -Inadequate oral intake (NI-2.1).  Status: Ongoing  RELATED TO: appetite  AS EVIDENCE BY: documented PO intake between 0-50%.   MONITORING/EVALUATION(Goals): Weight trends, labs, PO intake 1. PO intake greater than 75% at meals  EDUCATION NEEDS: -No education needs identified at this time  INTERVENTION: Patient refuses any nutrition supplements or snacks at this time.  RD to follow for nutrition needs.   Dietitian [phone]  DOCUMENTATION CODES Per approved criteria  -Not Applicable    Iven Finn [facility] 12/06/2011, 12:17 PM

## 2011-12-06 NOTE — Progress Notes (Signed)
CARE MANAGEMENT NOTE 12/06/2011  Patient:  Briana Mosley, Briana Mosley   Account Number:  [id number]  Date Initiated:  12/06/2011  Documentation initiated by:  Rosemary Holms  Subjective/Objective Assessment:   Pt admitted from [facility] with AMS.     Action/Plan:   DC plan to return to [facility].   Anticipated DC Date:  12/07/2011   Anticipated DC Plan:  SKILLED NURSING FACILITY      DC Planning Services  CM consult      Choice offered to / List presented to:             Status of service:  In process, will continue to follow Medicare Important Message given?   (If response is "NO", the following Medicare IM given date fields will be blank) Date Medicare IM given:   Date Additional Medicare IM given:    Discharge Disposition:    Per UR Regulation:    Comments:  12/06/11 1500 Lennox Dolberry Leanord Hawking RN BSN

## 2011-12-06 NOTE — Progress Notes (Signed)
Chart reviewed  Subjective: Complains of chronic back pain.  Objective: Vital signs in last 24 hours: Filed Vitals:   12/05/11 1400 12/05/11 2209 12/06/11 0449 12/06/11 1343  BP: 95/53 131/71 138/77 95/61  Pulse: 61 57 61 64  Temp: 97.8 F (36.6 C) 98.6 F (37 C) 97.4 F (36.3 C) 97.5 F (36.4 C)  TempSrc:  Oral Oral Oral  Resp: 20 20 20 20   Height:      Weight:      SpO2: 99% 99% 98% 98%   Weight change:   Intake/Output Summary (Last 24 hours) at 12/06/11 1644 Last data filed at 12/06/11 1230  Gross per 24 hour  Intake    360 ml  Output      0 ml  Net    360 ml   Physical Exam: Gen.: Alert, oriented and appropriate. Lungs clear to auscultation bilaterally without wheeze rhonchi or rales Cardiovascular regular rate rhythm without murmurs gallops rubs Abdomen soft nontender nondistended excellent extremities no clubbing cyanosis or edema Psychiatric normal affect. Calm and cooperative. No evidence of hallucinations currently. Lab Results: Basic Metabolic Panel:  Lab 12/06/11 1610 12/02/11 0538  NA 140 139  K 3.9 4.0  CL 104 104  CO2 27 25  GLUCOSE 97 88  BUN 25* 10  CREATININE 0.54 0.47*  CALCIUM 9.6 9.4  MG -- --  PHOS -- --   Liver Function Tests:  Lab 12/06/11 0513 12/02/11 0538  AST 15 16  ALT 14 14  ALKPHOS 70 83  BILITOT 0.6 0.9  PROT 6.9 7.3  ALBUMIN 3.5 3.8   No results found for this basename: LIPASE:2,AMYLASE:2 in the last 168 hours  Lab 11/29/11 2248  AMMONIA 8*   CBC:  Lab 12/06/11 0513 12/02/11 0538  WBC 5.9 7.1  NEUTROABS -- --  HGB 13.3 13.9  HCT 39.6 39.8  MCV 92.7 89.8  PLT 182 169   Cardiac Enzymes: No results found for this basename: CKTOTAL:3,CKMB:3,CKMBINDEX:3,TROPONINI:3 in the last 168 hours BNP: No results found for this basename: PROBNP:3 in the last 168 hours D-Dimer: No results found for this basename: DDIMER:2 in the last 168 hours CBG: No results found for this basename: GLUCAP:6 in the last 168  hours Hemoglobin A1C: No results found for this basename: HGBA1C in the last 168 hours Fasting Lipid Panel: No results found for this basename: CHOL,HDL,LDLCALC,TRIG,CHOLHDL,LDLDIRECT in the last 960 hours Thyroid Function Tests:  Lab 11/29/11 2253  TSH 1.307  T4TOTAL --  FREET4 --  T3FREE --  THYROIDAB --   Coagulation: No results found for this basename: LABPROT:4,INR:4 in the last 168 hours Anemia Panel:  Lab 12/01/11 0929  VITAMINB12 411  FOLATE --  FERRITIN --  TIBC --  IRON --  RETICCTPCT --   Urine Drug Screen: Drugs of Abuse     Component Value Date/Time   LABOPIA NONE DETECTED 11/29/2011 1142   COCAINSCRNUR NONE DETECTED 11/29/2011 1142   LABBENZ POSITIVE* 11/29/2011 1142   AMPHETMU NONE DETECTED 11/29/2011 1142   THCU NONE DETECTED 11/29/2011 1142   LABBARB NONE DETECTED 11/29/2011 1142    Alcohol Level: No results found for this basename: ETH:2 in the last 168 hours Urinalysis: No results found for this basename: COLORURINE:2,APPERANCEUR:2,LABSPEC:2,PHURINE:2,GLUCOSEU:2,HGBUR:2,BILIRUBINUR:2,KETONESUR:2,PROTEINUR:2,UROBILINOGEN:2,NITRITE:2,LEUKOCYTESUR:2 in the last 168 hours   Micro Results: Recent Results (from the past 240 hour(s))  MRSA PCR SCREENING     Status: Normal   Collection Time   11/29/11 11:45 PM      Component Value Range Status Comment  MRSA by PCR NEGATIVE  NEGATIVE  Final   URINE CULTURE     Status: Normal   Collection Time   12/03/11  4:57 AM      Component Value Range Status Comment   Specimen Description URINE, CATHETERIZED   Final    Special Requests NONE   Final    Culture  Setup Time [id number]   Final    Colony Count >=100,000 COLONIES/ML   Final    Culture ENTEROCOCCUS SPECIES   Final    Report Status 12/05/2011 FINAL   Final    Organism ID, Bacteria ENTEROCOCCUS SPECIES   Final   BODY FLUID CULTURE     Status: Normal (Preliminary result)   Collection Time   12/03/11 10:35 AM      Component Value Range Status Comment    Specimen Description CSF   Final    Special Requests NONE   Final    Gram Stain     Final    Value: NO WBC SEEN     NO ORGANISMS SEEN   Culture NO GROWTH 2 DAYS   Final    Report Status PENDING   Incomplete   GRAM STAIN     Status: Normal   Collection Time   12/03/11 10:35 AM      Component Value Range Status Comment   Specimen Description CSF   Final    Special Requests NONE   Final    Gram Stain NO ORGANISMS SEEN   Final    Report Status 12/04/2011 FINAL   Final    Studies/Results: Mr Thoracic Spine W Wo Contrast  12/06/2011  *RADIOLOGY REPORT*  Clinical Data: Mid back pain.  History of prior thoracic surgery.  MRI THORACIC SPINE WITHOUT AND WITH CONTRAST  Technique:  Multiplanar and multiecho pulse sequences of the thoracic spine were obtained without and with intravenous contrast.  Contrast: 12mL MULTIHANCE GADOBENATE DIMEGLUMINE 529 MG/ML IV SOLN  Comparison: None.  Findings: The sagittal MR images demonstrate normal alignment of the thoracic vertebral bodies.  They demonstrate normal marrow signal except for a few small scattered hemangiomas.  The thoracic spinal cord demonstrates normal signal intensity.  No syrinx. There is a wide decompressive laminectomy noted extending from a T9-T11. No recurrent mass is identified.  Mild dural enhancement in this area is likely a postsurgical change.  Small central disc protrusion noted at T8-9.  No other significant disc protrusions, spinal or foraminal stenosis.  IMPRESSION:  1.  Surgical changes related to a prior wide decompressive laminectomy from T9-T11.  No mass is identified.  The dural enhancement is not an unexpected finding. 2.  Small disc protrusion at T8-9 without direct neural compression.  Original Report Authenticated By: P. Loralie Champagne, M.D.   Mr Lumbar Spine W Wo Contrast  12/06/2011  *RADIOLOGY REPORT*  Clinical Data: Low back pain.  MRI LUMBAR SPINE WITHOUT AND WITH CONTRAST  Technique:  Multiplanar and multiecho pulse sequences of  the lumbar spine were obtained without and with intravenous contrast.  Contrast: 12mL MULTIHANCE GADOBENATE DIMEGLUMINE 529 MG/ML IV SOLN  Comparison: Prior study 09/09/2009.  Findings: The sagittal MR images demonstrate normal alignment of the lumbar vertebral bodies.  They demonstrate normal marrow signal except for endplate reactive changes at L5-S1 and a few small scattered hemangiomas.  The last full intervertebral disc space is labeled L5-S1 and the conus medullaris terminates at T12-L1.  There are mild to moderate facet degenerative changes but no definite pars defects.  No significant paraspinal or  retroperitoneal process is identified.  L1-2:  No significant findings.  L2-3:  No significant findings.  L3-4:  Mild bulging annulus but no significant spinal or foraminal stenosis.  Mild to moderate facet disease.  L4-5:  Mild bulging annulus and mild to moderate facet disease with early lateral recess encroachment bilaterally.  No significant spinal stenosis or foraminal stenosis.  Mild foraminal encroachment on the left.  L5-S1:  Disc desiccation and degeneration.  The disc protrusion identified on the prior study has retracted and desiccated somewhat.  There is mild left foraminal stenosis.  No significant spinal stenosis.  IMPRESSION:  1.  Early lateral recess encroachment bilaterally at L4-5.  Mild left foraminal encroachment also. 2.  Interval desiccation and retraction of the disc protrusion at L5-S1.  There is mild persistent left foraminal stenosis.  Original Report Authenticated By: P. Loralie Champagne, M.D.   Scheduled Meds:   . amoxicillin  500 mg Oral Q8H  . atenolol  50 mg Oral BID  . heparin subcutaneous  5,000 Units Subcutaneous Q8H  . lisinopril  40 mg Oral Daily  . nicotine  7 mg Transdermal Daily  . thiamine  100 mg Oral Daily   Continuous Infusions:  PRN Meds:.acetaminophen, acetaminophen, ALPRAZolam, gadobenate dimeglumine, gadobenate dimeglumine, hydrALAZINE,  HYDROcodone-acetaminophen, ondansetron (ZOFRAN) IV, ondansetron Assessment/Plan: Principal Problem:  *Hallucinations Active Problems:  Confusion  Hypertension  Back pain  Hallucinations have resolved. MRI ordered by neurology noted. Back to skilled nursing facility when okay with neuro. Blood pressure is borderline low. Decrease atenolol.   LOS: 7 days   Decker Cogdell L 12/06/2011, 4:44 PM

## 2011-12-06 NOTE — Progress Notes (Signed)
Subjective: Interval History:  Objective: Vital signs in last 24 hours: Temp:  [97.4 F (36.3 C)-98.6 F (37 C)] 97.4 F (36.3 C) (03/04 0449) Pulse Rate:  [54-61] 61  (03/04 0449) Resp:  [20] 20  (03/04 0449) BP: (90-138)/(53-77) 138/77 mmHg (03/04 0449) SpO2:  [98 %-99 %] 98 % (03/04 0449)  Intake/Output from previous day: 03/03 0701 - 03/04 0700 In: 360 [P.O.:360] Out: -  Intake/Output this shift:   Nutritional status: Cardiac    Lab Results:  [facility] 12/06/11 0513  WBC 5.9  HGB 13.3  HCT 39.6  PLT 182  NA 140  K 3.9  CL 104  CO2 27  GLUCOSE 97  BUN 25*  CREATININE 0.54  CALCIUM 9.6  LABA1C --   Lipid Panel No results found for this basename: CHOL,TRIG,HDL,CHOLHDL,VLDL,LDLCALC in the last 72 hours  Studies/Results: No results found.  Medications:  Scheduled Meds:    . amoxicillin  500 mg Oral Q8H  . atenolol  50 mg Oral BID  . heparin subcutaneous  5,000 Units Subcutaneous Q8H  . lisinopril  40 mg Oral Daily  . nicotine  7 mg Transdermal Daily  . thiamine  100 mg Oral Daily  . DISCONTD: atenolol  100 mg Oral BID   Continuous Infusions:  PRN Meds:.acetaminophen, acetaminophen, hydrALAZINE, ondansetron (ZOFRAN) IV, ondansetron   Assessment/Plan: See dictation   LOS: 7 days   Whitni Pasquini

## 2011-12-06 NOTE — Progress Notes (Signed)
Atenolol 25mg  held this pm due to pts b/p 100/64 and HR 56 Briana Mosley

## 2011-12-07 LAB — BODY FLUID CULTURE: Culture: NO GROWTH

## 2011-12-07 MED ORDER — AMPICILLIN 250 MG PO CAPS: 250.0000 mg | ORAL_CAPSULE | Freq: Four times a day (QID) | ORAL | Status: AC

## 2011-12-07 MED ORDER — ALPRAZOLAM 0.5 MG PO TABS: 0.2500 mg | ORAL_TABLET | Freq: Three times a day (TID) | ORAL | Status: DC | PRN

## 2011-12-07 MED ORDER — OXYCODONE-ACETAMINOPHEN 5-325 MG PO TABS: 0.5000 | ORAL_TABLET | Freq: Three times a day (TID) | ORAL | Status: DC | PRN

## 2011-12-07 NOTE — Progress Notes (Signed)
Report given to Delfino Lovett, RN with [facility].

## 2011-12-07 NOTE — Progress Notes (Signed)
Pt to return to [facility] today via family transport. Pt's dtr to arrive at approx 1330 to transport pt. No other CSW needs. CSW signing off.  Dellie Burns, MSW, LCSWA [phone] (cover)

## 2011-12-07 NOTE — Discharge Summary (Addendum)
Physician Discharge Summary  Patient ID: Briana Mosley MRN: [id number] DOB/AGE: 1965-10-27 46 y.o.  Admit date: 11/29/2011 Discharge date: 12/07/2011  Discharge Diagnoses:  Principal Problem:  *Hallucinations/encephalopathy, secondary to urinary tract infection versus ciprofloxacin  Hypertension  Back pain Long-standing gait impairment Enterococcus urinary tract infection  Medication List  As of 12/07/2011  9:48 AM   STOP taking these medications         ciprofloxacin 250 MG tablet      cloNIDine 0.1 MG tablet      losartan 50 MG tablet         TAKE these medications         ALPRAZolam 0.5 MG tablet   Commonly known as: XANAX   Take 0.5 tablets (0.25 mg total) by mouth 3 (three) times daily as needed for anxiety.      ampicillin 250 MG capsule   Commonly known as: PRINCIPEN   Take 1 capsule (250 mg total) by mouth 4 (four) times daily.      atenolol 50 MG tablet   Commonly known as: TENORMIN   Take 50 mg by mouth 2 (two) times daily.      lisinopril 20 MG tablet   Commonly known as: PRINIVIL,ZESTRIL   Take 20 mg by mouth daily.      oxyCODONE-acetaminophen 5-325 MG per tablet   Commonly known as: PERCOCET   Take 0.5 tablets by mouth 3 (three) times daily as needed.      VITAMIN B 12 PO   Take 1 tablet by mouth daily.            Discharge Orders    Future Orders Please Complete By Expires   Diet - low sodium heart healthy      Walk with assistance         followup with nursing home physician within 2 weeks  Disposition: Skilled nursing facility  Discharged Condition:  Stable  Consults: Treatment Team:  Beryle Beams, MD Radiology for lumbar puncture  Labs:   140  139      Potassium      3.4  4.0      Chloride      106  104      CO2      27  25      BUN      11  10      Creatinine, Ser      0.43  0.47      Calcium      8.5  9.4      GFR calc non Af Amer      >90  >90      GFR calc Af Amer      >90  >90      Glucose, Bld      93  88        Alkaline Phosphatase        83      Albumin        3.8      AST        16      ALT        14      Total Protein        7.3      Ammonia      8        Total Bilirubin        0.9      OTHER CHEM   Vitamin B-12  411      AMINO ACIDS   Homocysteine-Norm        7.1      PROTEIN ELP   Total Protein        7.3      CBC   WBC      6.1  7.1      RBC      3.91  4.43      Hemoglobin      12.2  13.9      HCT      35.6  39.8      MCV      91.0  89.8      MCH      31.2  31.4      MCHC      34.3  34.9      RDW      12.6  12.4      Platelets      143  169      DIFFERENTIAL   Neutrophils Relative      67        Lymphocytes Relative      25        Monocytes Relative      7        Eosinophils Relative      1        Basophils Relative      1        Neutro Abs      4.6        Lymphs Abs      1.7        Monocytes Absolute      0.5        Eosinophils Absolute      0.1        Basophils Absolute      0.0        OTHER HEMATOLOGY   Sed Rate        9      DIABETES   Glucose, Bld      93  88      THYROID   TSH      1.307        AUTOIMMUNE   ANA        POSITIVE      ANA Pattern 1        (NOTE)      ANA Titer 1        NEGATIVE      BACTERIAL TESTS   RPR        NON REACTIVE      FUNGAL TESTS   Crypto Ag          NEGATIVE    Cryptococcal Ag Titer          NOT INDICATED    URINALYSIS   Color, Urine      YELLOW        APPearance      CLEAR        Specific Gravity, Urine      1.025        pH      6.0        Glucose, UA      NEGATIVE        Bilirubin Urine      NEGATIVE        Ketones, ur      TRACE        Protein, ur      NEGATIVE  Urobilinogen, UA      0.2        Nitrite      NEGATIVE        Leukocytes, UA      NEGATIVE        Hgb urine dipstick      NEGATIVE        SPINAL FLUID   Glucose, CSF          67    Total Protein, CSF          39    RBC Count, CSF          0    WBC, CSF          1    Segmented Neutrophils-CSF          TOO FEW TO COUNT, SMEAR AVAILABLE FOR  REVIEW    Lymphs, CSF          TOO FEW TO COUNT, SMEAR AVAILABLE FOR REVIEW    Monocyte-Macrophage-Spinal Fluid          TOO FEW TO COUNT, SMEAR AVAILABLE FOR REVIEW    Eosinophils, CSF          TOO FEW TO COUNT, SMEAR AVAILABLE FOR REVIEW    Other Cells, CSF          RARE LYMPH SEEN ON SCAN    Appearance, CSF          CLEAR    Color, CSF          COLORLESS    Supernatant          NOT APPLICABLE    Tube #          4    CHEMISTRY, FLUID   Fluid Type-FCT          CSF    TOX, BLOOD   Alcohol, Ethyl (B)      <11        TOX, URINE   Amphetamines      NONE DETECTED        Barbiturates      NONE DETECTED        Benzodiazepines      POSITIVE        Opiates      NONE DETECTED        Cocaine      NONE DETECTED        Tetrahydrocannabinol      NONE DETECTED        MICROBIOLOGY    Diagnostics:  Dg Chest 1 View  11/30/2011  *RADIOLOGY REPORT*  Clinical Data: Altered mental status; weakness.  CHEST - 1 VIEW  Comparison: Chest radiograph performed 04/14/2010  Findings: The lungs are well-aerated and clear.  There is no evidence of focal opacification, pleural effusion or pneumothorax.  The heart is normal in size; the mediastinal contour is within normal limits.  No acute osseous abnormalities are seen.  IMPRESSION: No acute cardiopulmonary process seen.  Original Report Authenticated By: Tonia Ghent, M.D.   Ct Head Wo Contrast  11/29/2011  *RADIOLOGY REPORT*  Clinical Data: Summation ends.  Insomnia.  Hyperthyroidism.  CT HEAD WITHOUT CONTRAST  Technique:  Contiguous axial images were obtained from the base of the skull through the vertex without contrast.  Comparison: None.  Findings: The brain has a normal appearance without evidence of malformation, old or acute small or large vessel infarction, mass lesion, hemorrhage, hydrocephalus or extra-axial collection.  The calvarium is unremarkable.  Sinuses, middle ears and  mastoids are clear.  IMPRESSION: Normal head CT  Original Report Authenticated By:  Thomasenia Sales, M.D.   Mr Brain Wo Contrast  11/30/2011  *RADIOLOGY REPORT*  Clinical Data: Altered mental status with confusion.  Difficulty standing.  Hypertension.  MRI HEAD WITHOUT CONTRAST  Technique:  Multiplanar, multiecho pulse sequences of the brain and surrounding structures were obtained according to standard protocol without intravenous contrast.  Comparison: CT head earlier in the day.  MR head 01/22/2011.  Findings: There is no evidence for acute infarction, intracranial hemorrhage, mass lesion, hydrocephalus, or extra-axial fluid. There is no atrophy or significant white matter disease.  Normal midline structures. Widely patent intracranial vasculature.  Sinuses, mastoids and orbits all appear unremarkable.  IMPRESSION: Negative.  Original Report Authenticated By: Elsie Stain, M.D.   Mr Thoracic Spine W Wo Contrast  12/06/2011  *RADIOLOGY REPORT*  Clinical Data: Mid back pain.  History of prior thoracic surgery.  MRI THORACIC SPINE WITHOUT AND WITH CONTRAST  Technique:  Multiplanar and multiecho pulse sequences of the thoracic spine were obtained without and with intravenous contrast.  Contrast: 12mL MULTIHANCE GADOBENATE DIMEGLUMINE 529 MG/ML IV SOLN  Comparison: None.  Findings: The sagittal MR images demonstrate normal alignment of the thoracic vertebral bodies.  They demonstrate normal marrow signal except for a few small scattered hemangiomas.  The thoracic spinal cord demonstrates normal signal intensity.  No syrinx. There is a wide decompressive laminectomy noted extending from a T9-T11. No recurrent mass is identified.  Mild dural enhancement in this area is likely a postsurgical change.  Small central disc protrusion noted at T8-9.  No other significant disc protrusions, spinal or foraminal stenosis.  IMPRESSION:  1.  Surgical changes related to a prior wide decompressive laminectomy from T9-T11.  No mass is identified.  The dural enhancement is not an unexpected finding. 2.  Small  disc protrusion at T8-9 without direct neural compression.  Original Report Authenticated By: P. Loralie Champagne, M.D.   Mr Lumbar Spine W Wo Contrast  12/06/2011  *RADIOLOGY REPORT*  Clinical Data: Low back pain.  MRI LUMBAR SPINE WITHOUT AND WITH CONTRAST  Technique:  Multiplanar and multiecho pulse sequences of the lumbar spine were obtained without and with intravenous contrast.  Contrast: 12mL MULTIHANCE GADOBENATE DIMEGLUMINE 529 MG/ML IV SOLN  Comparison: Prior study 09/09/2009.  Findings: The sagittal MR images demonstrate normal alignment of the lumbar vertebral bodies.  They demonstrate normal marrow signal except for endplate reactive changes at L5-S1 and a few small scattered hemangiomas.  The last full intervertebral disc space is labeled L5-S1 and the conus medullaris terminates at T12-L1.  There are mild to moderate facet degenerative changes but no definite pars defects.  No significant paraspinal or retroperitoneal process is identified.  L1-2:  No significant findings.  L2-3:  No significant findings.  L3-4:  Mild bulging annulus but no significant spinal or foraminal stenosis.  Mild to moderate facet disease.  L4-5:  Mild bulging annulus and mild to moderate facet disease with early lateral recess encroachment bilaterally.  No significant spinal stenosis or foraminal stenosis.  Mild foraminal encroachment on the left.  L5-S1:  Disc desiccation and degeneration.  The disc protrusion identified on the prior study has retracted and desiccated somewhat.  There is mild left foraminal stenosis.  No significant spinal stenosis.  IMPRESSION:  1.  Early lateral recess encroachment bilaterally at L4-5.  Mild left foraminal encroachment also. 2.  Interval desiccation and retraction of the disc protrusion at L5-S1.  There is  mild persistent left foraminal stenosis.  Original Report Authenticated By: P. Loralie Champagne, M.D.   Dg Fluoro Guide Ndl Plc/bx  12/03/2011  *RADIOLOGY REPORT*  Clinical Data: Altered  mental status, uncontrolled with jerking movements  FLUOROSCOPIC GUIDED LUMBAR PUNCTURE:  Technique: Written informed consent for fluoroscopic-guided lumbar puncture was obtained. Time-out protocol followed. Patient was placed prone. L4-L5 disc space was localized under fluoroscopy. Skin prepped and draped in usual sterile fashion. Skin and soft tissues anesthetized with 1.5 ml of 1% lidocaine. 22 gauge spinal needle advanced into spinal canal. Clear colorless CSF was encountered with opening pressure of 20 cm H2O. 8 ml ml of CSF was obtained in four tubes for requested laboratory analysis. Procedure tolerated well by patient without immediate complication. Fluid sent to laboratory for requested analysis.  Fluoroscopy time: 0.7 minutes  IMPRESSION: Fluoroscopic guided lumbar puncture as above.  Original Report Authenticated By: Lollie Marrow, M.D.    Procedures: LP  Full Code   Hospital Course: See H&P for complete admission details. The patient is a pleasant 46 year old white female from skilled nursing facility who presented with confusion and hallucinations. She had been diagnosed with urinary tract infection prior to admission and was started on ciprofloxacin. She was seen by the telepsychiatry service in the emergency room and subsequently admitted to the hospitalist service. She had an extensive workup, including imaging and lumbar puncture. She had a positive ANA, but titers were negative. Urine culture grew out enterococcus which was sensitive to ampicillin nitrofurantoin and vancomycin. Resistant to fluoroquinolone and tetracycline. Neurology was consulted. It was felt that her hallucinations were related to encephalopathy from urinary tract infection and/or ciprofloxacin. Her antibiotics were adjusted based on culture results. Eventually, her mental status returned to baseline. She has a chronic gait impairment which is stable at this time. Her benzodiazepines and opiate analgesics were decreased.  Her blood pressure has been labile. See discharge medication list for any changes in her medication regimen. Total time on the day of discharge greater than 30 minutes.  Discharge Exam:  Blood pressure 111/66, pulse 60, temperature 98.1 F (36.7 C), temperature source Oral, resp. rate 20, height 5\' 5"  (1.651 m), weight 60.3 kg (132 lb 15 oz), last menstrual period 11/17/2011, SpO2 96.00%.  Unchanged from 12/06/2011  Signed: Crista Curb L 12/07/2011, 9:48 AM

## 2011-12-07 NOTE — Progress Notes (Signed)
Report called to Delfino Lovett, RN with [facility]. Patient's niece who also works for [employer] was given patient's discharge paperwork. Patient in stable condition. Dr. Lendell Caprice stated patient does not need a prescription since she is going to [facility]

## 2011-12-07 NOTE — Progress Notes (Signed)
NAMESANTIAGA, BUTZIN            ACCOUNT NO.:  [id number]  MEDICAL RECORD NO.:  [id number]  LOCATION:  A329                          FACILITY:  [facility]  PHYSICIAN:  Keelen Quevedo A. Gerilyn Pilgrim, M.D. DATE OF BIRTH:  02/28/1966  DATE OF PROCEDURE: DATE OF DISCHARGE:                                PROGRESS NOTE   The patient appears to have had some modest improvement.  She seems more oriented, not seen a lot of the visual hallucinations.  She does realize not as she has been having some hallucinations that they were not renal. Today, she is awake and alert.  She knows she is in the Hospital at [facility].  She is more with it and more fluid and lucid, more conversant, does follow commands well.  PHYSICAL EXAMINATION:  HEENT EVALUATION:  Head is normocephalic, atraumatic. NECK:  Supple. ABDOMEN:  Soft. EXTREMITIES:  No significant edema. NEUROLOGIC:  Cranial nerves; pupils are equal, round, reactive to light and accommodation.  Extraocular movements are full.  Facial muscle strength is symmetric.  Motor examination; she has 4 hip flexion on the right, 4+ on the left, 4+ deltoids bilaterally, hand grip 5.  CSF analysis essentially is negative.  Culture and Gram stain negative. Glucose 67, protein 39, WBC 1, RBC 0.  The patient has grown out enterococcus in the urine.  I suspect this is the most likely etiology for the confusion and delirium.  ASSESSMENT AND PLAN:  Acute delirium likely due to enterococcus urinary tract infection.  She is responding to antibiotics.  We will sign off, reconsult as needed.     Demani Mcbrien A. Gerilyn Pilgrim, M.D.     KAD/MEDQ  D:  12/06/2011  T:  12/07/2011  Job:  [id number]

## 2011-12-08 LAB — VDRL, CSF: VDRL Quant, CSF: NONREACTIVE

## 2015-01-16 ENCOUNTER — Other Ambulatory Visit: Payer: Self-pay | Admitting: *Deleted

## 2015-01-16 DIAGNOSIS — I6523 Occlusion and stenosis of bilateral carotid arteries: Secondary | ICD-10-CM

## 2015-01-24 ENCOUNTER — Encounter: Payer: Self-pay | Admitting: Vascular Surgery

## 2015-01-27 ENCOUNTER — Encounter: Payer: Self-pay | Admitting: Vascular Surgery

## 2015-01-27 ENCOUNTER — Ambulatory Visit (INDEPENDENT_AMBULATORY_CARE_PROVIDER_SITE_OTHER): Payer: Medicare Other | Admitting: Vascular Surgery

## 2015-01-27 ENCOUNTER — Ambulatory Visit (HOSPITAL_COMMUNITY)
Admission: RE | Admit: 2015-01-27 | Discharge: 2015-01-27 | Disposition: A | Discharge status: Home / Self Care | Payer: Medicare Other | Source: Ambulatory Visit | Attending: Vascular Surgery | Admitting: Vascular Surgery

## 2015-01-27 VITALS — BP 146/76 | HR 95 | Resp 16 | Ht 65.0 in | Wt 135.0 lb

## 2015-01-27 DIAGNOSIS — I6523 Occlusion and stenosis of bilateral carotid arteries: Secondary | ICD-10-CM

## 2015-01-27 DIAGNOSIS — I83214 Varicose veins of right lower extremity with both ulcer of heel and midfoot and inflammation: Secondary | ICD-10-CM

## 2015-01-27 DIAGNOSIS — I6521 Occlusion and stenosis of right carotid artery: Secondary | ICD-10-CM | POA: Diagnosis not present

## 2015-01-27 DIAGNOSIS — I6529 Occlusion and stenosis of unspecified carotid artery: Secondary | ICD-10-CM | POA: Insufficient documentation

## 2015-01-27 DIAGNOSIS — I83219 Varicose veins of right lower extremity with both ulcer of unspecified site and inflammation: Secondary | ICD-10-CM

## 2015-01-27 DIAGNOSIS — M79606 Pain in leg, unspecified: Secondary | ICD-10-CM

## 2015-01-27 DIAGNOSIS — I83215 Varicose veins of right lower extremity with both ulcer other part of foot and inflammation: Secondary | ICD-10-CM

## 2015-01-27 DIAGNOSIS — L97319 Non-pressure chronic ulcer of right ankle with unspecified severity: Secondary | ICD-10-CM

## 2015-01-27 DIAGNOSIS — I83218 Varicose veins of right lower extremity with both ulcer of other part of lower extremity and inflammation: Secondary | ICD-10-CM

## 2015-01-27 DIAGNOSIS — I83212 Varicose veins of right lower extremity with both ulcer of calf and inflammation: Secondary | ICD-10-CM

## 2015-01-27 DIAGNOSIS — I83211 Varicose veins of right lower extremity with both ulcer of thigh and inflammation: Secondary | ICD-10-CM

## 2015-01-27 DIAGNOSIS — I872 Venous insufficiency (chronic) (peripheral): Secondary | ICD-10-CM

## 2015-01-27 DIAGNOSIS — I6522 Occlusion and stenosis of left carotid artery: Secondary | ICD-10-CM

## 2015-01-27 DIAGNOSIS — L97309 Non-pressure chronic ulcer of unspecified ankle with unspecified severity: Secondary | ICD-10-CM

## 2015-01-27 DIAGNOSIS — I83213 Varicose veins of right lower extremity with both ulcer of ankle and inflammation: Secondary | ICD-10-CM

## 2015-01-27 DIAGNOSIS — I83003 Varicose veins of unspecified lower extremity with ulcer of ankle: Secondary | ICD-10-CM | POA: Insufficient documentation

## 2015-01-27 DIAGNOSIS — I83013 Varicose veins of right lower extremity with ulcer of ankle: Secondary | ICD-10-CM

## 2015-01-27 NOTE — Progress Notes (Signed)
Filed Vitals:   01/27/15 1403 01/27/15 1405 01/27/15 1406  BP: 141/73 130/77 146/76  Pulse: 98 97 95  Resp: 16    Height: 5\' 5"  (1.651 m)    Weight: 135 lb (61.236 kg)     Body mass index is 22.47 kg/(m^2).

## 2015-01-27 NOTE — Progress Notes (Signed)
NEW CAROTID EVALUATION     History of Present Illness:  Patient is a 49 y.o. year old female who presents for evaluation of carotid stenosis.  The patient denies symptoms of TIA, amaurosis, or stroke.  The patient is currently on no antiplatelet therapy.  The carotid stenosis was found yearly exam.  The patient denies any events associated with her recent L ICA occlusion.  Other medical problems include Right lateral leg wound 3 months old.  She is non ambulatory for 4 years and primary mode of transpotation is WC.  She notes chronic B leg swelling.  She has under wound care previous in a wound care center but currently is getting wound care at home with nursing.  She has HTN, hypokalemia, anxiety, chronic tobacco abuse 2 packs per day for 30+ years and history of lumbar and cervical DDD.  She was currently taken off her Beta Blocker and ACE I.    Past Medical History  Diagnosis Date  . Chronic back pain   . Hypertension   . Thyroid disease     hyperthyroidism  . Lower extremity weakness     Past Surgical History  Procedure Laterality Date  . Back surgery    . Cholecystectomy       Social History History  Substance Use Topics  . Smoking status: Current Every Day Smoker -- 30 years  . Smokeless tobacco: Never Used  . Alcohol Use: No    Family History Family History  Problem Relation Age of Onset  . Cancer Mother   . Heart disease Father   . Diabetes Sister     Allergies  Allergies  Allergen Reactions  . Bactrim   . Neurontin [Gabapentin]   . Sulfa Antibiotics   . Tramadol Nausea Only     Current Outpatient Prescriptions  Medication Sig Dispense Refill  . baclofen (LIORESAL) 10 MG tablet Take 10 mg by mouth 4 (four) times daily.    . clonazePAM (KLONOPIN) 0.5 MG tablet Take 0.5 mg by mouth 3 (three) times daily as needed for anxiety.    . Cyanocobalamin (VITAMIN B 12 PO) Take 1 tablet by mouth daily.    . magnesium oxide (MAG-OX) 400 MG tablet Take 400 mg by  mouth daily.    Marland Kitchen oxyCODONE-acetaminophen (PERCOCET) 5-325 MG per tablet Take 0.5 tablets by mouth 3 (three) times daily as needed. 30 tablet   . potassium chloride (K-DUR,KLOR-CON) 10 MEQ tablet Take 10 mEq by mouth daily.    Marland Kitchen ALPRAZolam (XANAX) 0.5 MG tablet Take 0.5 tablets (0.25 mg total) by mouth 3 (three) times daily as needed for anxiety. (Patient not taking: Reported on 01/27/2015)    . atenolol (TENORMIN) 50 MG tablet Take 50 mg by mouth 2 (two) times daily.    Marland Kitchen lisinopril (PRINIVIL,ZESTRIL) 20 MG tablet Take 20 mg by mouth daily.     No current facility-administered medications for this visit.    ROS:   General:  No weight loss, Fever, chills  HEENT: No recent headaches, no nasal bleeding, no visual changes, no sore throat  Neurologic: No dizziness, blackouts, seizures. No recent symptoms of stroke or mini- stroke. No recent episodes of slurred speech, or temporary blindness.  Cardiac: No recent episodes of chest pain/pressure, no shortness of breath at rest.  No shortness of breath with exertion.  Denies history of atrial fibrillation or irregular heartbeat  Vascular: No history of rest pain in feet.  No history of claudication.  Positive history of non-healing ulcer,  No history of DVT   Pulmonary: No home oxygen, no productive cough, no hemoptysis,  No asthma or wheezing  Musculoskeletal:  [ ]  Arthritis, [x ] Low back pain,  [ ]  Joint pain  Hematologic:No history of hypercoagulable state.  No history of easy bleeding.  No history of anemia  Gastrointestinal: No hematochezia or melena,  No gastroesophageal reflux, no trouble swallowing  Urinary: [ ]  chronic Kidney disease, [ ]  on HD - [ ]  MWF or [ ]  TTHS, [ ]  Burning with urination, [ ]  Frequent urination, [ ]  Difficulty urinating;   Skin: No rashes  Psychological: No history of anxiety,  No history of depression   Physical Examination  Filed Vitals:   01/27/15 1403 01/27/15 1405 01/27/15 1406  BP: 141/73 130/77  146/76  Pulse: 98 97 95  Resp: 16    Height: 5\' 5"  (1.651 m)    Weight: 135 lb (61.236 kg)      Body mass index is 22.47 kg/(m^2).  General:  Alert and oriented, no acute distress HEENT: Normal Neck: No bruit or JVD Pulmonary: Clear to auscultation bilaterally Cardiac: Regular Rate and Rhythm without murmur Gastrointestinal: Soft, non-tender, non-distended, no mass, no scars Skin: Right lateral leg ulcer 2 cm diameter with LE edema, shallow wound bed, no granulation, no drainage, chronic venous stasis changes in R ant shin Extremity Pulses:  2+ radial, brachial, femoral,Non palpable dorsalis pedis, posterior tibial pulses bilaterally.  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 Bridgett Larsson concerning her right leg ulcer.  She will continue home wound care until her f/u. The patient is currently not on a statin: will need lipid panel to see if indicated.  The patient is currently not on an anti-platelet: due to open wounds per patient.  Would recommend starting 81 mg PO daily.  We recommend smoking cessation   She was seen in conjunction with Dr. Bridgett Larsson today   Theda Sers, [facility] PA-C [facility] [place] Office: [phone]   Addendum  I have independently interviewed and examined the patient, and I agree with the physician assistant's findings.   Pt asx from her LICA occlusion.  Continue surveillance annually.  In regards to the R ankle ulcer, it appears to be possible mixed venous and arterial in origins.  Depending on severity of her arterial disease, she may not be candidate for compressive therapy.  Continue wound care mgmt for now.  Studies are ordered for the subsequent follow up.  Adele Barthel, MD [facility] [place] Office: [phone] Pager: [phone]  01/27/2015, 3:35 PM

## 2015-01-28 NOTE — Addendum Note (Signed)
Addended by: Mena Goes on: 01/28/2015 04:29 PM   Modules accepted: Orders

## 2015-01-29 ENCOUNTER — Encounter: Payer: Self-pay | Admitting: Internal Medicine

## 2015-02-20 ENCOUNTER — Encounter: Payer: Self-pay | Admitting: Vascular Surgery

## 2015-02-20 ENCOUNTER — Other Ambulatory Visit: Payer: Self-pay | Admitting: Vascular Surgery

## 2015-02-20 DIAGNOSIS — L97909 Non-pressure chronic ulcer of unspecified part of unspecified lower leg with unspecified severity: Secondary | ICD-10-CM

## 2015-02-21 ENCOUNTER — Ambulatory Visit (INDEPENDENT_AMBULATORY_CARE_PROVIDER_SITE_OTHER)
Admission: RE | Admit: 2015-02-21 | Discharge: 2015-02-21 | Disposition: A | Discharge status: Home / Self Care | Payer: Medicare Other | Source: Ambulatory Visit | Attending: Vascular Surgery | Admitting: Vascular Surgery

## 2015-02-21 ENCOUNTER — Encounter: Payer: Self-pay | Admitting: Vascular Surgery

## 2015-02-21 ENCOUNTER — Other Ambulatory Visit (HOSPITAL_COMMUNITY): Payer: Self-pay | Admitting: *Deleted

## 2015-02-21 ENCOUNTER — Other Ambulatory Visit: Payer: Self-pay

## 2015-02-21 ENCOUNTER — Ambulatory Visit (INDEPENDENT_AMBULATORY_CARE_PROVIDER_SITE_OTHER): Payer: Medicare Other | Admitting: Vascular Surgery

## 2015-02-21 VITALS — BP 154/69 | HR 101 | Ht 65.0 in | Wt 133.9 lb

## 2015-02-21 DIAGNOSIS — L97909 Non-pressure chronic ulcer of unspecified part of unspecified lower leg with unspecified severity: Secondary | ICD-10-CM | POA: Diagnosis not present

## 2015-02-21 DIAGNOSIS — L97219 Non-pressure chronic ulcer of right calf with unspecified severity: Secondary | ICD-10-CM | POA: Diagnosis present

## 2015-02-21 DIAGNOSIS — E039 Hypothyroidism, unspecified: Secondary | ICD-10-CM | POA: Diagnosis present

## 2015-02-21 DIAGNOSIS — N390 Urinary tract infection, site not specified: Secondary | ICD-10-CM | POA: Diagnosis present

## 2015-02-21 DIAGNOSIS — I998 Other disorder of circulatory system: Secondary | ICD-10-CM | POA: Insufficient documentation

## 2015-02-21 DIAGNOSIS — Z882 Allergy status to sulfonamides status: Secondary | ICD-10-CM

## 2015-02-21 DIAGNOSIS — Z9049 Acquired absence of other specified parts of digestive tract: Secondary | ICD-10-CM | POA: Diagnosis present

## 2015-02-21 DIAGNOSIS — J449 Chronic obstructive pulmonary disease, unspecified: Secondary | ICD-10-CM | POA: Diagnosis present

## 2015-02-21 DIAGNOSIS — I6529 Occlusion and stenosis of unspecified carotid artery: Secondary | ICD-10-CM | POA: Diagnosis present

## 2015-02-21 DIAGNOSIS — I6523 Occlusion and stenosis of bilateral carotid arteries: Secondary | ICD-10-CM | POA: Diagnosis not present

## 2015-02-21 DIAGNOSIS — I739 Peripheral vascular disease, unspecified: Principal | ICD-10-CM | POA: Diagnosis present

## 2015-02-21 DIAGNOSIS — E785 Hyperlipidemia, unspecified: Secondary | ICD-10-CM | POA: Diagnosis present

## 2015-02-21 DIAGNOSIS — F419 Anxiety disorder, unspecified: Secondary | ICD-10-CM | POA: Diagnosis present

## 2015-02-21 DIAGNOSIS — I70229 Atherosclerosis of native arteries of extremities with rest pain, unspecified extremity: Secondary | ICD-10-CM

## 2015-02-21 DIAGNOSIS — K219 Gastro-esophageal reflux disease without esophagitis: Secondary | ICD-10-CM | POA: Diagnosis present

## 2015-02-21 DIAGNOSIS — E876 Hypokalemia: Secondary | ICD-10-CM | POA: Diagnosis present

## 2015-02-21 DIAGNOSIS — I2510 Atherosclerotic heart disease of native coronary artery without angina pectoris: Secondary | ICD-10-CM | POA: Diagnosis present

## 2015-02-21 DIAGNOSIS — Z993 Dependence on wheelchair: Secondary | ICD-10-CM

## 2015-02-21 DIAGNOSIS — Z7982 Long term (current) use of aspirin: Secondary | ICD-10-CM

## 2015-02-21 DIAGNOSIS — M549 Dorsalgia, unspecified: Secondary | ICD-10-CM | POA: Diagnosis present

## 2015-02-21 DIAGNOSIS — I10 Essential (primary) hypertension: Secondary | ICD-10-CM | POA: Diagnosis present

## 2015-02-21 DIAGNOSIS — F17210 Nicotine dependence, cigarettes, uncomplicated: Secondary | ICD-10-CM | POA: Diagnosis present

## 2015-02-21 DIAGNOSIS — G8929 Other chronic pain: Secondary | ICD-10-CM | POA: Diagnosis present

## 2015-02-21 DIAGNOSIS — Z888 Allergy status to other drugs, medicaments and biological substances status: Secondary | ICD-10-CM

## 2015-02-21 DIAGNOSIS — E079 Disorder of thyroid, unspecified: Secondary | ICD-10-CM | POA: Diagnosis present

## 2015-02-21 NOTE — Progress Notes (Signed)
I was unable to reach patient by phone.  I left  A message on voice mail.  I instructed the patient to arrive at [place] entrance at 1230  , nothing to eat or drink after midnight.   I instructed the patient to take the following medications in the am with just enough water to get them down:  Atenolol and Baclofen; if needed Klonopin, Tylox. I asked patient to not wear any lotions, powders, cologne, jewelry, piercing, make-up or nail polish, to not shave within 48 hours.  I asked the patient to call [phone], in the am if there were any questions or problems.

## 2015-02-21 NOTE — Progress Notes (Signed)
HISTORY AND PHYSICAL     CC:  Non healing wound right leg Referring Provider:  Glenda Mosley., MD  HPI: This is a 49 y.o. female pt of Dr. Bridgett Mosley who is followed for carotid artery stenosis and was last seen in April 2016.  At the time of her visit, she did have a right leg wound that was ~ 88 months old and not healing.  She has not been ambulatory for ~ 4 years and uses a wheelchair.  She presents today for further testing for her right leg wounds.  She is on a beta blocker and ACEI for hypertension.    Past Medical History  Diagnosis Date  . Chronic back pain   . Hypertension   . Thyroid disease     hyperthyroidism  . Lower extremity weakness     Past Surgical History  Procedure Laterality Date  . Back surgery    . Cholecystectomy      Allergies  Allergen Reactions  . Bactrim   . Neurontin [Gabapentin]   . Sulfa Antibiotics   . Tramadol Nausea Only    Current Outpatient Prescriptions  Medication Sig Dispense Refill  . baclofen (LIORESAL) 10 MG tablet Take 10 mg by mouth 4 (four) times daily.    . clonazePAM (KLONOPIN) 0.5 MG tablet Take 0.5 mg by mouth 3 (three) times daily as needed for anxiety.    . Cyanocobalamin (VITAMIN B 12 PO) Take 1 tablet by mouth daily.    . magnesium oxide (MAG-OX) 400 (241.3 MG) MG tablet     . magnesium oxide (MAG-OX) 400 MG tablet Take 400 mg by mouth daily.    . Magnesium Oxide 400 (240 MG) MG TABS     . Oxycodone HCl 10 MG TABS     . potassium chloride (K-DUR,KLOR-CON) 10 MEQ tablet Take 10 mEq by mouth daily.    Marland Kitchen ALPRAZolam (XANAX) 0.5 MG tablet Take 0.5 tablets (0.25 mg total) by mouth 3 (three) times daily as needed for anxiety. (Patient not taking: Reported on 01/27/2015)    . amoxicillin (AMOXIL) 500 MG capsule     . atenolol (TENORMIN) 50 MG tablet Take 50 mg by mouth 2 (two) times daily.    Marland Kitchen lisinopril (PRINIVIL,ZESTRIL) 20 MG tablet Take 20 mg by mouth daily.    Marland Kitchen oxyCODONE-acetaminophen (PERCOCET) 5-325 MG per tablet Take  0.5 tablets by mouth 3 (three) times daily as needed. (Patient not taking: Reported on 02/21/2015) 30 tablet    No current facility-administered medications for this visit.    Family History  Problem Relation Age of Onset  . Cancer Mother   . Heart disease Father   . Diabetes Sister     History   Social History  . Marital Status: Divorced    Spouse Name: N/A  . Number of Children: N/A  . Years of Education: N/A   Occupational History  . Not on file.   Social History Main Topics  . Smoking status: Current Every Day Smoker -- 30 years  . Smokeless tobacco: Never Used  . Alcohol Use: No  . Drug Use: No  . Sexual Activity: Not on file   Other Topics Concern  . Not on file   Social History Narrative     ROS: [x]  Positive   [ ]  Negative   [ ]  All sytems reviewed and are negative  Cardiovascular: []  chest pain/pressure []  palpitations []  SOB lying flat []  DOE []  pain in legs while walking []  pain in feet  when lying flat []  hx of DVT []  hx of phlebitis []  swelling in legs []  varicose veins  Pulmonary: []  productive cough []  asthma []  wheezing  Neurologic: []  weakness in []  arms []  legs []  numbness in []  arms []  legs [] difficulty speaking or slurred speech []  temporary loss of vision in one eye []  dizziness  Hematologic: []  bleeding problems []  problems with blood clotting easily  GI []  vomiting blood []  blood in stool  GU: []  burning with urination []  blood in urine  Psychiatric: []  hx of major depression [x]  anxiety  Integumentary: []  rashes [x]  ulcers  Constitutional: []  fever []  chills   PHYSICAL EXAMINATION:  Filed Vitals:   02/21/15 1522  BP: 154/69  Pulse: 101   Body mass index is 22.28 kg/(m^2).  General:  WDWN in NAD Gait: Not observed-in wheelchair HENT: WNL, normocephalic Pulmonary: normal non-labored breathing , without Rales, rhonchi,  wheezing Skin: without rashes, with nonhealing ulcer to right anterior shin and  lateral ulcer Vascular Exam/Pulses: Pedal pulses are not palpable Extremities: with ischemic changes, without Gangrene , without cellulitis; with open wounds;  Musculoskeletal: no muscle wasting or atrophy  Neurologic: A&O X 3; Appropriate Affect ; SENSATION: normal; MOTOR FUNCTION:  moving all extremities equally. Speech is fluent/normal   Non-Invasive Vascular Imaging:   ABI's 02/21/15: Right:  0.32 Left:  0.66  Arterial duplex right leg 02/21/15: -triphasic CFA & PFA -Right SFA occluded -monophasic popliteal artery (appears small)  Pt meds includes: Statin:  No. Beta Blocker:  No. Aspirin:  No. ACEI:  No. ARB:  No. Other Antiplatelet/Anticoagulant:  No.    ASSESSMENT/PLAN:: 49 y.o. female with critical limb threatening ischemia right leg with non healing wounds   -pt with non healing wounds to right leg and ABI of 0.32.  She will be taken for aortogram with bilateral lower extremity runoff with possible intervention right leg on Monday, Feb 24, 2015 -she understands that this is limb threatening and the need to proceed is urgent -Dr. Bridgett Mosley has discussed the risks of bleeding, pseudoaneurysm, embolization, and damage to the artery to be less than 1%.  She and her family member understand and are in agreement to proceed -he discussed the need to be well hydrated the day before the procedure. -she is not taking the beta blocker or ACEI.  She is not on a statin, but she will need to be started on one after her procedure.  She may also be started on an aspirin and or Plavix.  Briana Locket, PA-C [facility] [phone]  Clinic MD:  Pt seen and examined in conjunction with Dr. Bridgett Mosley   Addendum  I have independently interviewed and examined the patient, and I agree with the physician assistant's findings.  BLE ABI concerning for CLI in R leg which probably accounts for the poor wound healing in that leg.  Clearly this patient is already at risk of limb loss.   Will arrange for Ao, BRo, possible R leg intervention on Monday, 23 MAY 16.  I discussed with the patient the nature of angiographic procedures, especially the limited patencies of any endovascular intervention.  The patient is aware of that the risks of an angiographic procedure include but are not limited to: bleeding, infection, access site complications, renal failure, embolization, rupture of vessel, dissection, possible need for emergent surgical intervention, possible need for surgical procedures to treat the patient's pathology, anaphylactic reaction to contrast, and stroke and death.  The patient is aware of the risks and  agrees to proceed.   Adele Barthel, MD [facility] [place] Office: [phone] Pager: [phone]  02/21/2015, 4:11 PM

## 2015-02-24 ENCOUNTER — Ambulatory Visit (HOSPITAL_COMMUNITY): Payer: Medicare Other | Admitting: Anesthesiology

## 2015-02-24 ENCOUNTER — Other Ambulatory Visit: Payer: Self-pay

## 2015-02-24 ENCOUNTER — Encounter (HOSPITAL_COMMUNITY): Payer: Self-pay | Admitting: Anesthesiology

## 2015-02-24 ENCOUNTER — Encounter (HOSPITAL_COMMUNITY)
Admission: RE | Disposition: A | Discharge status: Home / Self Care | Payer: Self-pay | Source: Ambulatory Visit | Attending: Vascular Surgery

## 2015-02-24 ENCOUNTER — Inpatient Hospital Stay (HOSPITAL_COMMUNITY)
Admission: RE | Admit: 2015-02-24 | Discharge: 2015-02-28 | DRG: 287 | Disposition: A | Discharge status: Home / Self Care | Payer: Medicare Other | Source: Ambulatory Visit | Attending: Vascular Surgery | Admitting: Vascular Surgery

## 2015-02-24 DIAGNOSIS — I2510 Atherosclerotic heart disease of native coronary artery without angina pectoris: Secondary | ICD-10-CM | POA: Diagnosis present

## 2015-02-24 DIAGNOSIS — K219 Gastro-esophageal reflux disease without esophagitis: Secondary | ICD-10-CM | POA: Diagnosis present

## 2015-02-24 DIAGNOSIS — I70229 Atherosclerosis of native arteries of extremities with rest pain, unspecified extremity: Secondary | ICD-10-CM

## 2015-02-24 DIAGNOSIS — J449 Chronic obstructive pulmonary disease, unspecified: Secondary | ICD-10-CM | POA: Diagnosis present

## 2015-02-24 DIAGNOSIS — M549 Dorsalgia, unspecified: Secondary | ICD-10-CM | POA: Diagnosis present

## 2015-02-24 DIAGNOSIS — F419 Anxiety disorder, unspecified: Secondary | ICD-10-CM | POA: Diagnosis present

## 2015-02-24 DIAGNOSIS — I998 Other disorder of circulatory system: Secondary | ICD-10-CM

## 2015-02-24 DIAGNOSIS — L97219 Non-pressure chronic ulcer of right calf with unspecified severity: Secondary | ICD-10-CM | POA: Diagnosis present

## 2015-02-24 DIAGNOSIS — E785 Hyperlipidemia, unspecified: Secondary | ICD-10-CM | POA: Diagnosis present

## 2015-02-24 DIAGNOSIS — Z9049 Acquired absence of other specified parts of digestive tract: Secondary | ICD-10-CM | POA: Diagnosis present

## 2015-02-24 DIAGNOSIS — Z882 Allergy status to sulfonamides status: Secondary | ICD-10-CM | POA: Diagnosis not present

## 2015-02-24 DIAGNOSIS — I739 Peripheral vascular disease, unspecified: Secondary | ICD-10-CM | POA: Diagnosis present

## 2015-02-24 DIAGNOSIS — I6529 Occlusion and stenosis of unspecified carotid artery: Secondary | ICD-10-CM | POA: Diagnosis present

## 2015-02-24 DIAGNOSIS — Z01810 Encounter for preprocedural cardiovascular examination: Secondary | ICD-10-CM

## 2015-02-24 DIAGNOSIS — E039 Hypothyroidism, unspecified: Secondary | ICD-10-CM | POA: Diagnosis present

## 2015-02-24 DIAGNOSIS — F17210 Nicotine dependence, cigarettes, uncomplicated: Secondary | ICD-10-CM | POA: Diagnosis present

## 2015-02-24 DIAGNOSIS — G8929 Other chronic pain: Secondary | ICD-10-CM | POA: Diagnosis present

## 2015-02-24 DIAGNOSIS — Z993 Dependence on wheelchair: Secondary | ICD-10-CM | POA: Diagnosis not present

## 2015-02-24 DIAGNOSIS — N390 Urinary tract infection, site not specified: Secondary | ICD-10-CM | POA: Diagnosis present

## 2015-02-24 DIAGNOSIS — E079 Disorder of thyroid, unspecified: Secondary | ICD-10-CM | POA: Diagnosis present

## 2015-02-24 DIAGNOSIS — Z7982 Long term (current) use of aspirin: Secondary | ICD-10-CM | POA: Diagnosis not present

## 2015-02-24 DIAGNOSIS — I10 Essential (primary) hypertension: Secondary | ICD-10-CM | POA: Diagnosis present

## 2015-02-24 DIAGNOSIS — Z888 Allergy status to other drugs, medicaments and biological substances status: Secondary | ICD-10-CM | POA: Diagnosis not present

## 2015-02-24 DIAGNOSIS — E876 Hypokalemia: Secondary | ICD-10-CM | POA: Diagnosis present

## 2015-02-24 DIAGNOSIS — R079 Chest pain, unspecified: Secondary | ICD-10-CM | POA: Diagnosis not present

## 2015-02-24 HISTORY — DX: Anxiety disorder, unspecified: F41.9

## 2015-02-24 HISTORY — DX: Gastro-esophageal reflux disease without esophagitis: K21.9

## 2015-02-24 HISTORY — PX: AORTOGRAM: SHX6300

## 2015-02-24 LAB — POCT I-STAT, CHEM 8
BUN: 16 mg/dL (ref 6–20)
CHLORIDE: 103 mmol/L (ref 101–111)
Calcium, Ion: 1.14 mmol/L (ref 1.12–1.23)
Creatinine, Ser: 0.8 mg/dL (ref 0.44–1.00)
Glucose, Bld: 80 mg/dL (ref 65–99)
HCT: 42 % (ref 36.0–46.0)
HEMOGLOBIN: 14.3 g/dL (ref 12.0–15.0)
POTASSIUM: 4 mmol/L (ref 3.5–5.1)
SODIUM: 140 mmol/L (ref 135–145)
TCO2: 24 mmol/L (ref 0–100)

## 2015-02-24 LAB — CBC
HCT: 36.3 % (ref 36.0–46.0)
Hemoglobin: 11.8 g/dL — ABNORMAL LOW (ref 12.0–15.0)
MCH: 28.3 pg (ref 26.0–34.0)
MCHC: 32.5 g/dL (ref 30.0–36.0)
MCV: 87.1 fL (ref 78.0–100.0)
Platelets: 148 10*3/uL — ABNORMAL LOW (ref 150–400)
RBC: 4.17 MIL/uL (ref 3.87–5.11)
RDW: 13.7 % (ref 11.5–15.5)
WBC: 5.4 10*3/uL (ref 4.0–10.5)

## 2015-02-24 LAB — CREATININE, SERUM
Creatinine, Ser: 0.8 mg/dL (ref 0.44–1.00)
GFR calc Af Amer: >60 mL/min (ref >60)

## 2015-02-24 LAB — HCG, SERUM, QUALITATIVE: Preg, Serum: NEGATIVE

## 2015-02-24 SURGERY — AORTOGRAM
Anesthesia: Monitor Anesthesia Care | Site: Groin

## 2015-02-24 MED ORDER — FENTANYL CITRATE (PF) 100 MCG/2ML IJ SOLN
INTRAMUSCULAR | Status: DC | PRN
  Administered 2015-02-24 (×2): 25 ug via INTRAVENOUS
  Administered 2015-02-24: 50 ug via INTRAVENOUS
  Administered 2015-02-24 (×2): 25 ug via INTRAVENOUS

## 2015-02-24 MED ORDER — CLONAZEPAM 0.5 MG PO TABS: 0.5000 mg | ORAL_TABLET | Freq: Three times a day (TID) | ORAL | Status: DC | PRN

## 2015-02-24 MED ORDER — BACLOFEN 10 MG PO TABS
10.0000 mg | ORAL_TABLET | Freq: Four times a day (QID) | ORAL | Status: DC
  Administered 2015-02-24 – 2015-02-28 (×15): 10 mg via ORAL
  Filled 2015-02-24 (×19): qty 1

## 2015-02-24 MED ORDER — SODIUM CHLORIDE 0.9 % IR SOLN
Status: DC | PRN
  Administered 2015-02-24: 200 mL

## 2015-02-24 MED ORDER — MIDAZOLAM HCL 5 MG/5ML IJ SOLN
INTRAMUSCULAR | Status: DC | PRN
  Administered 2015-02-24 (×4): 0.5 mg via INTRAVENOUS

## 2015-02-24 MED ORDER — MIDAZOLAM HCL 2 MG/2ML IJ SOLN
INTRAMUSCULAR | Status: AC
  Filled 2015-02-24: qty 2

## 2015-02-24 MED ORDER — PROPOFOL 10 MG/ML IV BOLUS
INTRAVENOUS | Status: AC
  Filled 2015-02-24: qty 20

## 2015-02-24 MED ORDER — OXYCODONE HCL 5 MG PO TABS
5.0000 mg | ORAL_TABLET | ORAL | Status: DC | PRN
  Administered 2015-02-24 – 2015-02-28 (×11): 10 mg via ORAL
  Filled 2015-02-24 (×4): qty 2
  Filled 2015-02-24: qty 1
  Filled 2015-02-24 (×7): qty 2

## 2015-02-24 MED ORDER — IODIXANOL 320 MG/ML IV SOLN
INTRAVENOUS | Status: DC | PRN
  Administered 2015-02-24: 100 mL via INTRA_ARTERIAL
  Administered 2015-02-24: 1.1 mL via INTRA_ARTERIAL

## 2015-02-24 MED ORDER — LACTATED RINGERS IV SOLN
INTRAVENOUS | Status: DC
  Administered 2015-02-24 (×2): via INTRAVENOUS

## 2015-02-24 MED ORDER — SODIUM CHLORIDE 0.9 % IV SOLN
INTRAVENOUS | Status: DC
  Administered 2015-02-24 – 2015-02-28 (×6): via INTRAVENOUS

## 2015-02-24 MED ORDER — LABETALOL HCL 5 MG/ML IV SOLN
10.0000 mg | INTRAVENOUS | Status: DC | PRN
Start: 2015-02-24 — End: 2015-02-28
  Administered 2015-02-25: 10 mg via INTRAVENOUS
  Filled 2015-02-24 (×3): qty 4

## 2015-02-24 MED ORDER — ONDANSETRON HCL 4 MG/2ML IJ SOLN: 4.0000 mg | Freq: Four times a day (QID) | INTRAMUSCULAR | Status: DC | PRN

## 2015-02-24 MED ORDER — HYDRALAZINE HCL 20 MG/ML IJ SOLN: 5.0000 mg | INTRAMUSCULAR | Status: DC | PRN

## 2015-02-24 MED ORDER — PROPOFOL 10 MG/ML IV BOLUS
INTRAVENOUS | Status: DC | PRN
  Administered 2015-02-24: 50 mg via INTRAVENOUS

## 2015-02-24 MED ORDER — HYDROMORPHONE HCL 1 MG/ML IJ SOLN
INTRAMUSCULAR | Status: AC
  Filled 2015-02-24: qty 1

## 2015-02-24 MED ORDER — SODIUM CHLORIDE 0.9 % IV SOLN: INTRAVENOUS | Status: DC

## 2015-02-24 MED ORDER — OXYCODONE HCL 5 MG PO TABS
ORAL_TABLET | ORAL | Status: AC
  Administered 2015-02-24: 10 mg via ORAL
  Filled 2015-02-24: qty 2

## 2015-02-24 MED ORDER — ONDANSETRON HCL 4 MG/2ML IJ SOLN
INTRAMUSCULAR | Status: DC | PRN
  Administered 2015-02-24: 4 mg via INTRAVENOUS

## 2015-02-24 MED ORDER — ACETAMINOPHEN 325 MG PO TABS: 325.0000 mg | ORAL_TABLET | ORAL | Status: DC | PRN

## 2015-02-24 MED ORDER — MAGNESIUM OXIDE 400 MG PO TABS
400.0000 mg | ORAL_TABLET | Freq: Every day | ORAL | Status: DC
  Administered 2015-02-24 – 2015-02-28 (×5): 400 mg via ORAL
  Filled 2015-02-24 (×5): qty 1

## 2015-02-24 MED ORDER — PROPOFOL INFUSION 10 MG/ML OPTIME
INTRAVENOUS | Status: DC | PRN
  Administered 2015-02-24: 50 ug/kg/min via INTRAVENOUS

## 2015-02-24 MED ORDER — POLYETHYLENE GLYCOL 3350 17 G PO PACK
17.0000 g | PACK | Freq: Every day | ORAL | Status: DC | PRN
  Filled 2015-02-24: qty 1

## 2015-02-24 MED ORDER — LIDOCAINE-EPINEPHRINE (PF) 1 %-1:200000 IJ SOLN
INTRAMUSCULAR | Status: AC
  Filled 2015-02-24: qty 10

## 2015-02-24 MED ORDER — DOCUSATE SODIUM 100 MG PO CAPS
100.0000 mg | ORAL_CAPSULE | Freq: Two times a day (BID) | ORAL | Status: DC
  Administered 2015-02-24 – 2015-02-28 (×8): 100 mg via ORAL
  Filled 2015-02-24 (×9): qty 1

## 2015-02-24 MED ORDER — ACETAMINOPHEN 325 MG RE SUPP
325.0000 mg | RECTAL | Status: DC | PRN
  Filled 2015-02-24: qty 2

## 2015-02-24 MED ORDER — FENTANYL CITRATE (PF) 100 MCG/2ML IJ SOLN
25.0000 ug | INTRAMUSCULAR | Status: DC | PRN
  Administered 2015-02-24 (×2): 50 ug via INTRAVENOUS

## 2015-02-24 MED ORDER — METOPROLOL TARTRATE 1 MG/ML IV SOLN: 2.0000 mg | INTRAVENOUS | Status: DC | PRN

## 2015-02-24 MED ORDER — HYDROMORPHONE HCL 1 MG/ML IJ SOLN
0.5000 mg | INTRAMUSCULAR | Status: DC | PRN
  Administered 2015-02-24 – 2015-02-28 (×2): 1 mg via INTRAVENOUS
  Filled 2015-02-24: qty 1

## 2015-02-24 MED ORDER — BISACODYL 10 MG RE SUPP: 10.0000 mg | Freq: Every day | RECTAL | Status: DC | PRN

## 2015-02-24 MED ORDER — FENTANYL CITRATE (PF) 250 MCG/5ML IJ SOLN
INTRAMUSCULAR | Status: AC
  Filled 2015-02-24: qty 5

## 2015-02-24 MED ORDER — LIDOCAINE-EPINEPHRINE (PF) 1 %-1:200000 IJ SOLN
INTRAMUSCULAR | Status: DC | PRN
  Administered 2015-02-24: 10 mL

## 2015-02-24 MED ORDER — ENOXAPARIN SODIUM 40 MG/0.4ML ~~LOC~~ SOLN
40.0000 mg | SUBCUTANEOUS | Status: DC
  Administered 2015-02-25 – 2015-02-28 (×3): 40 mg via SUBCUTANEOUS
  Filled 2015-02-24 (×6): qty 0.4

## 2015-02-24 MED ORDER — FENTANYL CITRATE (PF) 100 MCG/2ML IJ SOLN
INTRAMUSCULAR | Status: AC
  Filled 2015-02-24: qty 2

## 2015-02-24 MED ORDER — FLEET ENEMA 7-19 GM/118ML RE ENEM
1.0000 | ENEMA | Freq: Once | RECTAL | Status: AC | PRN
  Filled 2015-02-24: qty 1

## 2015-02-24 SURGICAL SUPPLY — 76 items
BAG BANDED W/RUBBER/TAPE 36X54 (MISCELLANEOUS) ×3 IMPLANT
BAG SNAP BAND KOVER 36X36 (MISCELLANEOUS) ×3 IMPLANT
BANDAGE ELASTIC 4 VELCRO ST LF (GAUZE/BANDAGES/DRESSINGS) IMPLANT
BANDAGE ESMARK 6X9 LF (GAUZE/BANDAGES/DRESSINGS) IMPLANT
BNDG ESMARK 6X9 LF (GAUZE/BANDAGES/DRESSINGS)
CANISTER SUCTION 2500CC (MISCELLANEOUS) IMPLANT
CATH OMNI FLUSH .035X70CM (CATHETERS) ×3 IMPLANT
CLIP TI MEDIUM 24 (CLIP) IMPLANT
CLIP TI WIDE RED SMALL 24 (CLIP) ×3 IMPLANT
COVER BACK TABLE 60X90IN (DRAPES) ×9 IMPLANT
COVER DOME SNAP 22 D (MISCELLANEOUS) ×3 IMPLANT
COVER PROBE W GEL 5X96 (DRAPES) ×3 IMPLANT
CUFF TOURNIQUET SINGLE 24IN (TOURNIQUET CUFF) IMPLANT
CUFF TOURNIQUET SINGLE 34IN LL (TOURNIQUET CUFF) IMPLANT
CUFF TOURNIQUET SINGLE 44IN (TOURNIQUET CUFF) IMPLANT
DEVICE TORQUE KENDALL .025-038 (MISCELLANEOUS) ×3 IMPLANT
DRAIN CHANNEL 15F RND FF W/TCR (WOUND CARE) IMPLANT
DRAPE C-ARM 42X72 X-RAY (DRAPES) IMPLANT
DRSG COVADERM 4X10 (GAUZE/BANDAGES/DRESSINGS) IMPLANT
DRSG COVADERM 4X8 (GAUZE/BANDAGES/DRESSINGS) IMPLANT
ELECT REM PT RETURN 9FT ADLT (ELECTROSURGICAL)
ELECTRODE REM PT RTRN 9FT ADLT (ELECTROSURGICAL) IMPLANT
EVACUATOR SILICONE 100CC (DRAIN) IMPLANT
GLOVE BIO SURGEON STRL SZ7 (GLOVE) ×3 IMPLANT
GLOVE BIOGEL PI IND STRL 6.5 (GLOVE) ×2 IMPLANT
GLOVE BIOGEL PI IND STRL 7.5 (GLOVE) ×1 IMPLANT
GLOVE BIOGEL PI INDICATOR 6.5 (GLOVE) ×4
GLOVE BIOGEL PI INDICATOR 7.5 (GLOVE) ×2
GLOVE SURG SS PI 7.0 STRL IVOR (GLOVE) ×3 IMPLANT
GOWN STRL REUS W/ TWL LRG LVL3 (GOWN DISPOSABLE) ×3 IMPLANT
GOWN STRL REUS W/ TWL XL LVL3 (GOWN DISPOSABLE) ×1 IMPLANT
GOWN STRL REUS W/TWL LRG LVL3 (GOWN DISPOSABLE) ×6
GOWN STRL REUS W/TWL XL LVL3 (GOWN DISPOSABLE) ×2
GUIDEWIRE ANGLED .035X150CM (WIRE) ×3 IMPLANT
INSERT FOGARTY SM (MISCELLANEOUS) IMPLANT
KIT BASIN OR (CUSTOM PROCEDURE TRAY) ×3 IMPLANT
KIT ROOM TURNOVER OR (KITS) ×3 IMPLANT
MARKER GRAFT CORONARY BYPASS (MISCELLANEOUS) IMPLANT
NEEDLE HYPO 25GX1X1/2 BEV (NEEDLE) ×3 IMPLANT
NEEDLE PERC 18GX7CM (NEEDLE) ×3 IMPLANT
NS IRRIG 1000ML POUR BTL (IV SOLUTION) IMPLANT
PACK PERIPHERAL VASCULAR (CUSTOM PROCEDURE TRAY) ×3 IMPLANT
PAD ARMBOARD 7.5X6 YLW CONV (MISCELLANEOUS) ×6 IMPLANT
PADDING CAST COTTON 6X4 STRL (CAST SUPPLIES) IMPLANT
PROTECTION STATION PRESSURIZED (MISCELLANEOUS) ×3
SET MICROPUNCTURE 5F STIFF (MISCELLANEOUS) ×6 IMPLANT
SHEATH AVANTI 11CM 5FR (MISCELLANEOUS) ×3 IMPLANT
SPONGE GAUZE 4X4 12PLY STER LF (GAUZE/BANDAGES/DRESSINGS) ×3 IMPLANT
SPONGE SURGIFOAM ABS GEL 100 (HEMOSTASIS) IMPLANT
STAPLER VISISTAT 35W (STAPLE) IMPLANT
STATION PROTECTION PRESSURIZED (MISCELLANEOUS) ×1 IMPLANT
STOPCOCK 4 WAY LG BORE MALE ST (IV SETS) ×3 IMPLANT
STOPCOCK MORSE 400PSI 3WAY (MISCELLANEOUS) ×3 IMPLANT
SUT ETHILON 3 0 PS 1 (SUTURE) IMPLANT
SUT GORETEX 5 0 TT13 24 (SUTURE) IMPLANT
SUT GORETEX 6.0 TT13 (SUTURE) IMPLANT
SUT MNCRL AB 4-0 PS2 18 (SUTURE) IMPLANT
SUT PROLENE 5 0 C 1 24 (SUTURE) IMPLANT
SUT PROLENE 6 0 BV (SUTURE) IMPLANT
SUT PROLENE 7 0 BV 1 (SUTURE) IMPLANT
SUT SILK 2 0 FS (SUTURE) IMPLANT
SUT SILK 3 0 (SUTURE)
SUT SILK 3-0 18XBRD TIE 12 (SUTURE) IMPLANT
SUT VIC AB 2-0 CT1 27 (SUTURE)
SUT VIC AB 2-0 CT1 TAPERPNT 27 (SUTURE) IMPLANT
SUT VIC AB 3-0 SH 27 (SUTURE)
SUT VIC AB 3-0 SH 27X BRD (SUTURE) IMPLANT
SYR CONTROL 10ML LL (SYRINGE) ×3 IMPLANT
SYR MEDRAD MARK V 150ML (SYRINGE) ×3 IMPLANT
TAPE CLOTH SURG 4X10 WHT LF (GAUZE/BANDAGES/DRESSINGS) ×3 IMPLANT
TRAY FOLEY CATH 16FRSI W/METER (SET/KITS/TRAYS/PACK) IMPLANT
TUBING EXTENTION W/L.L. (IV SETS) IMPLANT
TUBING HIGH PRESSURE 120CM (CONNECTOR) ×3 IMPLANT
UNDERPAD 30X30 INCONTINENT (UNDERPADS AND DIAPERS) IMPLANT
WATER STERILE IRR 1000ML POUR (IV SOLUTION) IMPLANT
WIRE BENTSON .035X145CM (WIRE) ×3 IMPLANT

## 2015-02-24 NOTE — Op Note (Signed)
OPERATIVE NOTE   PROCEDURE: 1.  Right common femoral artery cannulation under ultrasound guidance 2.  Placement of catheter in aorta 3.  Aortogram 4.  Second order arterial selection 5.  Right leg runoff via sheath 6.  Left leg runoff via catheter  PRE-OPERATIVE DIAGNOSIS: critical limb ischemia right leg  POST-OPERATIVE DIAGNOSIS: same as above   SURGEON: Adele Barthel, MD  ANESTHESIA: general anesthesia  ESTIMATED BLOOD LOSS: 50 cc  CONTRAST: 110 cc  FINDING(S):  Aorta:  patent  Superior mesenteric artery: patent Celiac artery: patent   Right Left  RA patent patent  CIA patent patent  EIA patent patent  IIA patent patent  CFA patent patent  SFA Flush occlusion Patent proximally, high grade stenosis with short segment occlusion distally  PFA Patent, collaterals distally reconstitute popliteal artery Patent  Pop Reconstitutes near knee level, patent below-the-knee segment appears to be viable target Patent, reconstitutes at above-the-knee segment via collaterals shortly after short segment superficial femoral artery occlusion  Trif Patent Patent  AT Patent Patent  Pero Patent Patent  PT Patent Patent   SPECIMEN(S):  none  INDICATIONS:   Briana Mosley is a 49 y.o. female who presents with critical limb ischemia in right leg with poorly healing right ankle wounds.  The patient presents for: aortogram, bilateral leg runoff.  I discussed with the patient the nature of angiographic procedures, especially the limited patencies of any endovascular intervention.  The patient is aware of that the risks of an angiographic procedure include but are not limited to: bleeding, infection, access site complications, renal failure, embolization, rupture of vessel, dissection, possible need for emergent surgical intervention, possible need for surgical procedures to treat the patient's pathology, and stroke and death.  The patient is aware of the risks and agrees to  proceed.  DESCRIPTION: After full informed consent was obtained from the patient, the patient was brought back to the hybrid OR.  The patient was placed supine upon the angiography table and connected to monitoring equipment.  The patient was then given conscious sedation, the amounts of which are documented in the patient's chart.  The patient was prepped and drape in the standard fashion for an angiographic procedure.  At this point, attention was turned to the left groin.  I could not see a strong pulsatile artery in this side, rather I saw a barely pulsatile left common femoral artery.  I subsequently elected to look at this patient's right groin.  Under ultrasound guidance,  The subcutaneous tissue surrounding the right common femoral artery was anesthesized with 1% lidocaine with epinephrine.  The artery was then cannulated with a 18 gauge needle.  The [facility] wire was passed up into the aorta.  The needle was exchanged for a 5-Fr sheath, which was advanced over the wire into the common femoral artery.  The dilator was then removed.  The Omniflush catheter was then loaded over the wire up to the level of L1.  The catheter was connected to the power injector circuit.  After de-airring and de-clotting the circuit, a power injector aortogram was completed.  The findings are as listed above.  I pulled the catheter down to the distal aorta and completed a bilateral pelvic injection.  At this point, the patient was have great difficulty holding still.  I selected to try to image the right leg in stations.  The sheath was aspirated.  No clots were present and the sheath was reloaded with heparinized saline.  I connected the right  sheath to the power injector circuit.  I was able to image down to the distal thigh when she could no longer hold still.  At this point, Anesthesia elected to intubate the patient to prevent further competition with care.  I then continued the right leg runoff.  The findings are above.   Based on the images, this patient needs a right common femoral artery to below-the-knee popliteal artery bypass.    At this point, only a limited amount of contrast had been used, so I felt it was safe to proceed with a left leg runoff.  I replaced the wire and omniflush into the right sheath.  [facility] wire was replaced in the catheter, and using the [place] and Omniflush catheter, the left common iliac artery was selected.  The wire continued to select the left internal iliac artery, so I exchanged the wire for a Glidewire.  I selected the left common femoral artery and then placed the catheter in the left external iliac artery.  I removed the wire and then connected the power injector to the catheter.  The left leg runoff was completed in stations.  The findings are listed above.  Based on the images, I felt the left superficial femoral artery occlusion might be amendable to endovascular interventions with a subintimal dissection technique.  I replaced the wire into the catheter, straightening out the crook in the catheter.  Both were removed from the sheath together.  The sheath was aspirated.  No clots were present and the sheath was reloaded with heparinized saline.    I pulled out the sheath and held pressure for 20 minutes.  There remained a pulse in the right groin at this point without any obvious complications.  A sterile pressure bandage was applied to the right groin puncture site.  COMPLICATIONS: none  CONDITION: stable   Adele Barthel, MD [facility] [place] Office: [phone] Pager: [phone]  02/24/2015, 4:49 PM

## 2015-02-24 NOTE — Interval H&P Note (Signed)
[facility] [place]  History and Physical Update  The patient was interviewed and re-examined.  The patient's previous History and Physical has been reviewed and is unchanged from my consult.  There is no change in the plan of care: aortogram, bilateral leg runoff.  I discussed with the patient the nature of angiographic procedures, especially the limited patencies of any endovascular intervention.  The patient is aware of that the risks of an angiographic procedure include but are not limited to: bleeding, infection, access site complications, renal failure, embolization, rupture of vessel, dissection, possible need for emergent surgical intervention, possible need for surgical procedures to treat the patient's pathology, anaphylactic reaction to contrast, and stroke and death.  The patient is aware of the risks and agrees to proceed.  Adele Barthel, MD [facility] [place] Office: [phone] Pager: [phone]  02/24/2015, 2:33 PM

## 2015-02-24 NOTE — Anesthesia Preprocedure Evaluation (Addendum)
Anesthesia Evaluation  Patient identified by MRN, date of birth, ID band Patient awake    Reviewed: Allergy & Precautions, H&P , NPO status , Patient's Chart, lab work & pertinent test results  Airway Mallampati: II  TM Distance: >3 FB Neck ROM: Full    Dental no notable dental hx. (+) Upper Dentures, Poor Dentition, Dental Advisory Given, Partial Lower   Pulmonary Current Smoker,  breath sounds clear to auscultation  Pulmonary exam normal       Cardiovascular hypertension, Pt. on medications + Peripheral Vascular Disease Rhythm:Regular Rate:Normal     Neuro/Psych negative neurological ROS  negative psych ROS   GI/Hepatic Neg liver ROS, Medicated and Controlled,  Endo/Other  negative endocrine ROS  Renal/GU negative Renal ROS  negative genitourinary   Musculoskeletal   Abdominal   Peds  Hematology negative hematology ROS (+)   Anesthesia Other Findings   Reproductive/Obstetrics negative OB ROS                           Anesthesia Physical Anesthesia Plan  ASA: II  Anesthesia Plan: MAC   Post-op Pain Management:    Induction: Intravenous  Airway Management Planned: Simple Face Mask  Additional Equipment:   Intra-op Plan:   Post-operative Plan:   Informed Consent: I have reviewed the patients History and Physical, chart, labs and discussed the procedure including the risks, benefits and alternatives for the proposed anesthesia with the patient or authorized representative who has indicated his/her understanding and acceptance.   Dental advisory given  Plan Discussed with: CRNA  Anesthesia Plan Comments:         Anesthesia Quick Evaluation

## 2015-02-24 NOTE — Anesthesia Procedure Notes (Signed)
Procedure Name: LMA Insertion Date/Time: 02/24/2015 3:50 PM Performed by: Williemae Area B Pre-anesthesia Checklist: Patient identified, Emergency Drugs available, Suction available and Patient being monitored Patient Re-evaluated:Patient Re-evaluated prior to inductionOxygen Delivery Method: Circle system utilized Preoxygenation: Pre-oxygenation with 100% oxygen Intubation Type: IV induction LMA: LMA inserted LMA Size: 4.0 Number of attempts: 1 (Dr. Tamala Julian) Placement Confirmation: positive ETCO2 and breath sounds checked- equal and bilateral Tube secured with: Tape (taped across cheeks) Dental Injury: Teeth and Oropharynx as per pre-operative assessment

## 2015-02-24 NOTE — H&P (View-Only) (Signed)
HISTORY AND PHYSICAL     CC:  Non healing wound right leg Referring Provider:  Glenda Chroman., MD  HPI: This is a 49 y.o. female pt of Dr. Bridgett Larsson who is followed for carotid artery stenosis and was last seen in April 2016.  At the time of her visit, she did have a right leg wound that was ~ 45 months old and not healing.  She has not been ambulatory for ~ 4 years and uses a wheelchair.  She presents today for further testing for her right leg wounds.  She is on a beta blocker and ACEI for hypertension.    Past Medical History  Diagnosis Date  . Chronic back pain   . Hypertension   . Thyroid disease     hyperthyroidism  . Lower extremity weakness     Past Surgical History  Procedure Laterality Date  . Back surgery    . Cholecystectomy      Allergies  Allergen Reactions  . Bactrim   . Neurontin [Gabapentin]   . Sulfa Antibiotics   . Tramadol Nausea Only    Current Outpatient Prescriptions  Medication Sig Dispense Refill  . baclofen (LIORESAL) 10 MG tablet Take 10 mg by mouth 4 (four) times daily.    . clonazePAM (KLONOPIN) 0.5 MG tablet Take 0.5 mg by mouth 3 (three) times daily as needed for anxiety.    . Cyanocobalamin (VITAMIN B 12 PO) Take 1 tablet by mouth daily.    . magnesium oxide (MAG-OX) 400 (241.3 MG) MG tablet     . magnesium oxide (MAG-OX) 400 MG tablet Take 400 mg by mouth daily.    . Magnesium Oxide 400 (240 MG) MG TABS     . Oxycodone HCl 10 MG TABS     . potassium chloride (K-DUR,KLOR-CON) 10 MEQ tablet Take 10 mEq by mouth daily.    Marland Kitchen ALPRAZolam (XANAX) 0.5 MG tablet Take 0.5 tablets (0.25 mg total) by mouth 3 (three) times daily as needed for anxiety. (Patient not taking: Reported on 01/27/2015)    . amoxicillin (AMOXIL) 500 MG capsule     . atenolol (TENORMIN) 50 MG tablet Take 50 mg by mouth 2 (two) times daily.    Marland Kitchen lisinopril (PRINIVIL,ZESTRIL) 20 MG tablet Take 20 mg by mouth daily.    Marland Kitchen oxyCODONE-acetaminophen (PERCOCET) 5-325 MG per tablet Take  0.5 tablets by mouth 3 (three) times daily as needed. (Patient not taking: Reported on 02/21/2015) 30 tablet    No current facility-administered medications for this visit.    Family History  Problem Relation Age of Onset  . Cancer Mother   . Heart disease Father   . Diabetes Sister     History   Social History  . Marital Status: Divorced    Spouse Name: N/A  . Number of Children: N/A  . Years of Education: N/A   Occupational History  . Not on file.   Social History Main Topics  . Smoking status: Current Every Day Smoker -- 30 years  . Smokeless tobacco: Never Used  . Alcohol Use: No  . Drug Use: No  . Sexual Activity: Not on file   Other Topics Concern  . Not on file   Social History Narrative     ROS: [x]  Positive   [ ]  Negative   [ ]  All sytems reviewed and are negative  Cardiovascular: []  chest pain/pressure []  palpitations []  SOB lying flat []  DOE []  pain in legs while walking []  pain in feet  when lying flat []  hx of DVT []  hx of phlebitis []  swelling in legs []  varicose veins  Pulmonary: []  productive cough []  asthma []  wheezing  Neurologic: []  weakness in []  arms []  legs []  numbness in []  arms []  legs [] difficulty speaking or slurred speech []  temporary loss of vision in one eye []  dizziness  Hematologic: []  bleeding problems []  problems with blood clotting easily  GI []  vomiting blood []  blood in stool  GU: []  burning with urination []  blood in urine  Psychiatric: []  hx of major depression [x]  anxiety  Integumentary: []  rashes [x]  ulcers  Constitutional: []  fever []  chills   PHYSICAL EXAMINATION:  Filed Vitals:   02/21/15 1522  BP: 154/69  Pulse: 101   Body mass index is 22.28 kg/(m^2).  General:  WDWN in NAD Gait: Not observed-in wheelchair HENT: WNL, normocephalic Pulmonary: normal non-labored breathing , without Rales, rhonchi,  wheezing Skin: without rashes, with nonhealing ulcer to right anterior shin and  lateral ulcer Vascular Exam/Pulses: Pedal pulses are not palpable Extremities: with ischemic changes, without Gangrene , without cellulitis; with open wounds;  Musculoskeletal: no muscle wasting or atrophy  Neurologic: A&O X 3; Appropriate Affect ; SENSATION: normal; MOTOR FUNCTION:  moving all extremities equally. Speech is fluent/normal   Non-Invasive Vascular Imaging:   ABI's 02/21/15: Right:  0.32 Left:  0.66  Arterial duplex right leg 02/21/15: -triphasic CFA & PFA -Right SFA occluded -monophasic popliteal artery (appears small)  Pt meds includes: Statin:  No. Beta Blocker:  No. Aspirin:  No. ACEI:  No. ARB:  No. Other Antiplatelet/Anticoagulant:  No.    ASSESSMENT/PLAN:: 49 y.o. female with critical limb threatening ischemia right leg with non healing wounds   -pt with non healing wounds to right leg and ABI of 0.32.  She will be taken for aortogram with bilateral lower extremity runoff with possible intervention right leg on Monday, Feb 24, 2015 -she understands that this is limb threatening and the need to proceed is urgent -Dr. Bridgett Larsson has discussed the risks of bleeding, pseudoaneurysm, embolization, and damage to the artery to be less than 1%.  She and her family member understand and are in agreement to proceed -he discussed the need to be well hydrated the day before the procedure. -she is not taking the beta blocker or ACEI.  She is not on a statin, but she will need to be started on one after her procedure.  She may also be started on an aspirin and or Plavix.  Leontine Locket, PA-C [facility] [phone]  Clinic MD:  Pt seen and examined in conjunction with Dr. Bridgett Larsson   Addendum  I have independently interviewed and examined the patient, and I agree with the physician assistant's findings.  BLE ABI concerning for CLI in R leg which probably accounts for the poor wound healing in that leg.  Clearly this patient is already at risk of limb loss.   Will arrange for Ao, BRo, possible R leg intervention on Monday, 23 MAY 16.  I discussed with the patient the nature of angiographic procedures, especially the limited patencies of any endovascular intervention.  The patient is aware of that the risks of an angiographic procedure include but are not limited to: bleeding, infection, access site complications, renal failure, embolization, rupture of vessel, dissection, possible need for emergent surgical intervention, possible need for surgical procedures to treat the patient's pathology, anaphylactic reaction to contrast, and stroke and death.  The patient is aware of the risks and  agrees to proceed.   Adele Barthel, MD [facility] [place] Office: [phone] Pager: [phone]  02/21/2015, 4:11 PM

## 2015-02-24 NOTE — Transfer of Care (Signed)
Immediate Anesthesia Transfer of Care Note  Patient: Briana Mosley  Procedure(s) Performed: Procedure(s): AORTOGRAM WITH BILATERAL LOWER EXTREMITY RUNOFF (N/A)  Patient Location: PACU  Anesthesia Type:MAC and General  Level of Consciousness: awake, alert  and patient cooperative  Airway & Oxygen Therapy: Patient Spontanous Breathing, Patient connected to nasal cannula oxygen and Pt wailing with back pain; rx as charted  Post-op Assessment: Report given to RN, Post -op Vital signs reviewed and stable and Patient moving all extremities  Post vital signs: Reviewed and stable  Last Vitals:  Filed Vitals:   02/24/15 1253  BP: 179/66  Pulse: 87  Temp: 36.8 C  Resp: 20    Complications: No apparent anesthesia complications

## 2015-02-24 NOTE — Anesthesia Postprocedure Evaluation (Signed)
  Anesthesia Post-op Note  Patient: Briana Mosley  Procedure(s) Performed: Procedure(s): AORTOGRAM WITH BILATERAL LOWER EXTREMITY RUNOFF (N/A)  Patient Location: PACU  Anesthesia Type:General  Level of Consciousness: awake, oriented, sedated and patient cooperative  Airway and Oxygen Therapy: Patient Spontanous Breathing  Post-op Pain: mild  Post-op Assessment: Post-op Vital signs reviewed, Patient's Cardiovascular Status Stable, Respiratory Function Stable, Patent Airway, No signs of Nausea or vomiting and Pain level controlled  Post-op Vital Signs: stable  Last Vitals:  Filed Vitals:   02/24/15 1253  BP: 179/66  Pulse: 87  Temp: 36.8 C  Resp: 20    Complications: No apparent anesthesia complications

## 2015-02-25 ENCOUNTER — Encounter (HOSPITAL_COMMUNITY): Payer: Self-pay | Admitting: Vascular Surgery

## 2015-02-25 ENCOUNTER — Inpatient Hospital Stay (HOSPITAL_COMMUNITY): Payer: Medicare Other

## 2015-02-25 DIAGNOSIS — R079 Chest pain, unspecified: Secondary | ICD-10-CM

## 2015-02-25 LAB — POCT I-STAT, CHEM 8
BUN: 13 mg/dL (ref 6–20)
Calcium, Ion: 1.2 mmol/L (ref 1.12–1.23)
Chloride: 108 mmol/L (ref 101–111)
Creatinine, Ser: 0.7 mg/dL (ref 0.44–1.00)
Glucose, Bld: 86 mg/dL (ref 65–99)
HEMATOCRIT: 35 % — AB (ref 36.0–46.0)
Hemoglobin: 11.9 g/dL — ABNORMAL LOW (ref 12.0–15.0)
Potassium: 3.8 mmol/L (ref 3.5–5.1)
Sodium: 139 mmol/L (ref 135–145)
TCO2: 24 mmol/L (ref 0–100)

## 2015-02-25 LAB — URINE MICROSCOPIC-ADD ON

## 2015-02-25 LAB — BASIC METABOLIC PANEL
ANION GAP: 8 (ref 5–15)
BUN: 7 mg/dL (ref 6–20)
CO2: 25 mmol/L (ref 22–32)
CREATININE: 0.77 mg/dL (ref 0.44–1.00)
Calcium: 8.7 mg/dL — ABNORMAL LOW (ref 8.9–10.3)
Chloride: 105 mmol/L (ref 101–111)
Glucose, Bld: 81 mg/dL (ref 65–99)
Potassium: 4.3 mmol/L (ref 3.5–5.1)
Sodium: 138 mmol/L (ref 135–145)

## 2015-02-25 LAB — URINALYSIS, ROUTINE W REFLEX MICROSCOPIC
Bilirubin Urine: NEGATIVE
Glucose, UA: NEGATIVE mg/dL
Hgb urine dipstick: NEGATIVE
Ketones, ur: NEGATIVE mg/dL
NITRITE: POSITIVE — AB
PH: 7.5 (ref 5.0–8.0)
Protein, ur: NEGATIVE mg/dL
SPECIFIC GRAVITY, URINE: 1.01 (ref 1.005–1.030)
Urobilinogen, UA: 0.2 mg/dL (ref 0.0–1.0)

## 2015-02-25 LAB — CBC
HEMATOCRIT: 37.5 % (ref 36.0–46.0)
Hemoglobin: 12.6 g/dL (ref 12.0–15.0)
MCH: 28.8 pg (ref 26.0–34.0)
MCHC: 33.6 g/dL (ref 30.0–36.0)
MCV: 85.8 fL (ref 78.0–100.0)
Platelets: 145 10*3/uL — ABNORMAL LOW (ref 150–400)
RBC: 4.37 MIL/uL (ref 3.87–5.11)
RDW: 13.4 % (ref 11.5–15.5)
WBC: 6.4 10*3/uL (ref 4.0–10.5)

## 2015-02-25 LAB — LIPID PANEL
CHOL/HDL RATIO: 4.6 ratio
Cholesterol: 144 mg/dL (ref 0–200)
HDL: 31 mg/dL — AB (ref >40)
LDL Cholesterol: 98 mg/dL (ref 0–99)
TRIGLYCERIDES: 74 mg/dL (ref <150)
VLDL: 15 mg/dL (ref 0–40)

## 2015-02-25 LAB — TYPE AND SCREEN
ABO/RH(D): A NEG
Antibody Screen: POSITIVE
DAT, IGG: NEGATIVE

## 2015-02-25 MED ORDER — CEFTRIAXONE SODIUM IN DEXTROSE 20 MG/ML IV SOLN
1.0000 g | INTRAVENOUS | Status: AC
  Administered 2015-02-25 – 2015-02-27 (×3): 1 g via INTRAVENOUS
  Filled 2015-02-25 (×3): qty 50

## 2015-02-25 NOTE — Progress Notes (Signed)
Pt refused wound dressing to her right lower leg.

## 2015-02-25 NOTE — Progress Notes (Signed)
  Echocardiogram 2D Echocardiogram has been performed.  Darlina Sicilian M 02/25/2015, 12:14 PM

## 2015-02-25 NOTE — Progress Notes (Signed)
Pt has seen Dr. Einar Gip in the past.  I called a consult to his answering service this morning and spoke with Tiffany.  Briana Mosley 02/25/2015 7:57 AM

## 2015-02-25 NOTE — Significant Event (Signed)
Patient refuses to use BSC or bedpan, wants staff to put a diaper on her so she could just pee on it like she does at home. Made aware to patient that we do not provide diaper here and that she needs to call staff for assistance when she needs to pee. Patient has order for UA but will not allow staff to I/O catheter her or put her on bedpan or BSC to void.  Patient has been having incontinent episodes of urine since being admitted-it is very strong, foul/purulent ordor. Vascular PA made aware when she rounded this morning. Dshaun Reppucci, Therapist, sports.

## 2015-02-25 NOTE — Progress Notes (Addendum)
   Daily Progress Note  Assessment/Planning: 1. POD #1 s/p Ao, BRo which demonstrated R SFA long segment occlusion, L SFA short segment occlusion 2.  Chronic R calf ucler   BLE vein mapping  Cardiac preop evaluation with risk stratification  Thurs/Fri for R CFA to BK pop BPG given CLI in form of ulceration  Wet-to-dry dressing BID to R calf ulcer  Subjective  - 1 Day Post-Op  C/o back pain  Objective Filed Vitals:   02/24/15 1845 02/24/15 2141 02/25/15 0505 02/25/15 0658  BP: 158/70 161/68 184/85 153/79  Pulse: 94 92 102 98  Temp: 97.5 F (36.4 C) 98 F (36.7 C) 98 F (36.7 C)   TempSrc: Oral Oral Oral   Resp: 16 18 18    Height: 5\' 5"  (1.651 m)     Weight: 143 lb 6.4 oz (65.046 kg)     SpO2: 93% 92% 98%     Intake/Output Summary (Last 24 hours) at 02/25/15 0841 Last data filed at 02/24/15 2149  Gross per 24 hour  Intake   1120 ml  Output     10 ml  Net   1110 ml    PULM  CTAB CV  RRR GI  soft, NTND VASC  R groin without PSA or thrill, no obvious hematoma, both feet viable, R calf ulcer unchanged  Laboratory CBC    Component Value Date/Time   WBC 6.4 02/25/2015 0431   HGB 12.6 02/25/2015 0431   HCT 37.5 02/25/2015 0431   PLT 145* 02/25/2015 0431    BMET    Component Value Date/Time   NA 138 02/25/2015 0431   K 4.3 02/25/2015 0431   CL 105 02/25/2015 0431   CO2 25 02/25/2015 0431   GLUCOSE 81 02/25/2015 0431   BUN 7 02/25/2015 0431   CREATININE 0.77 02/25/2015 0431   CALCIUM 8.7* 02/25/2015 0431   GFRNONAA >60 02/25/2015 [place] >60 02/25/2015 [place], MD [facility] Office: [phone] Pager: [phone]  02/25/2015, 8:41 AM

## 2015-02-25 NOTE — Progress Notes (Signed)
Utilization review completed.  

## 2015-02-26 ENCOUNTER — Ambulatory Visit (HOSPITAL_COMMUNITY): Payer: Medicare Other

## 2015-02-26 ENCOUNTER — Inpatient Hospital Stay (HOSPITAL_COMMUNITY): Payer: Medicare Other

## 2015-02-26 DIAGNOSIS — Z01810 Encounter for preprocedural cardiovascular examination: Secondary | ICD-10-CM

## 2015-02-26 LAB — LIPID PANEL
CHOL/HDL RATIO: 4 ratio
Cholesterol: 145 mg/dL (ref 0–200)
HDL: 36 mg/dL — ABNORMAL LOW (ref >40)
LDL Cholesterol: 92 mg/dL (ref 0–99)
TRIGLYCERIDES: 87 mg/dL (ref <150)
VLDL: 17 mg/dL (ref 0–40)

## 2015-02-26 MED ORDER — TECHNETIUM TC 99M SESTAMIBI - CARDIOLITE: 10.0000 | Freq: Once | INTRAVENOUS | Status: AC | PRN

## 2015-02-26 MED ORDER — ATORVASTATIN CALCIUM 80 MG PO TABS
80.0000 mg | ORAL_TABLET | Freq: Every day | ORAL | Status: DC
  Administered 2015-02-26 – 2015-02-27 (×2): 80 mg via ORAL
  Filled 2015-02-26 (×3): qty 1

## 2015-02-26 MED ORDER — TECHNETIUM TC 99M SESTAMIBI GENERIC - CARDIOLITE
10.0000 | Freq: Once | INTRAVENOUS | Status: AC | PRN
  Administered 2015-02-26: 10 via INTRAVENOUS

## 2015-02-26 NOTE — Consult Note (Addendum)
CARDIOLOGY CONSULT NOTE  Patient ID: Briana Mosley MRN: [id number] DOB/AGE: 03-21-1966 49 y.o.  Admit date: 02/24/2015 Referring Physician  Adele Barthel, MD Primary Physician:  Glenda Chroman., MD Reason for Consultation  Pre op cardiac work up  HPI: Briana Mosley  is a 49 y.o. female  with history of hypertension, hyperlipidemia, tobacco use disorder, I had seen her remotely about 4 years ago, she has not had any further follow-up with me.  She is wheelchair-bound due to chronic pain and peripheral arterial disease.  She had developed nonhealing right leg ulcer after a trauma at home, and underwent lower extremity arteriogram which revealed long right SFA occlusion and focal left SFA occlusion.  He does for the best option would be for right femoropopliteal bypass surgery.  I was asked to do preoperative cardiac workup.  Patient denies any chest pain, has chronic cough, continues to smoke about a pack of cigarettes a day.  No fever, no chills.  Past Medical History  Diagnosis Date  . Chronic back pain   . Hypertension   . Thyroid disease     hyperthyroidism  . Lower extremity weakness   . Anxiety   . GERD (gastroesophageal reflux disease)     otc med if needed     Past Surgical History  Procedure Laterality Date  . Back surgery    . Cholecystectomy    . Aortogram N/A 02/24/2015    Procedure: AORTOGRAM WITH BILATERAL LOWER EXTREMITY RUNOFF;  Surgeon: Conrad Donnellson, MD;  Location: [facility] OR;  Service: Vascular;  Laterality: N/A;     Family History  Problem Relation Age of Onset  . Cancer Mother   . Heart disease Father   . Diabetes Sister      Social History: History   Social History  . Marital Status: Divorced    Spouse Name: N/A  . Number of Children: N/A  . Years of Education: N/A   Occupational History  . Not on file.   Social History Main Topics  . Smoking status: Current Every Day Smoker -- 1.00 packs/day for 30 years    Types: Cigarettes  . Smokeless  tobacco: Never Used  . Alcohol Use: No  . Drug Use: No  . Sexual Activity: Not on file   Other Topics Concern  . Not on file   Social History Narrative     Prescriptions prior to admission  Medication Sig Dispense Refill Last Dose  . baclofen (LIORESAL) 10 MG tablet Take 10 mg by mouth 4 (four) times daily.   02/23/2015 at Unknown time  . clonazePAM (KLONOPIN) 0.5 MG tablet Take 0.5 mg by mouth 3 (three) times daily as needed for anxiety.   02/24/2015 at Unknown time  . magnesium oxide (MAG-OX) 400 MG tablet Take 400 mg by mouth daily.   02/23/2015 at Unknown time  . Oxycodone HCl 10 MG TABS Take 10 mg by mouth 4 (four) times daily as needed (pain).    02/24/2015 at Unknown time  . ALPRAZolam (XANAX) 0.5 MG tablet Take 0.5 tablets (0.25 mg total) by mouth 3 (three) times daily as needed for anxiety. (Patient not taking: Reported on 01/27/2015)   Not Taking at Unknown time  . oxyCODONE-acetaminophen (PERCOCET) 5-325 MG per tablet Take 0.5 tablets by mouth 3 (three) times daily as needed. (Patient not taking: Reported on 02/21/2015) 30 tablet  Not Taking at Unknown time     ROS: General: no fevers/chills/night sweats Eyes: no blurry vision, diplopia, or amaurosis ENT: no  sore throat or hearing loss Resp: no cough, wheezing, or hemoptysis CV:  Bilateral leg edema. No chest pain or palpitations GI: no abdominal pain, nausea, vomiting, diarrhea, or constipation GU: no dysuria, frequency, or hematuria Skin: Ulcer right leg anteriorly Neuro: no headache, numbness, tingling, or weakness of extremities Musculoskeletal: no joint pain or swelling, chronic back ache Heme: no bleeding, DVT, or easy bruising Endo: no polydipsia or polyuria    Physical Exam: Blood pressure 108/62, pulse 95, temperature 97.9 F (36.6 C), temperature source Oral, resp. rate 18, height 5\' 5"  (1.651 m), weight 65.046 kg (143 lb 6.4 oz), last menstrual period 11/17/2011, SpO2 97 %.   General appearance: alert,  cooperative, appears older than stated age and no distress Lungs: wheezes bilaterally and extensive crackles bilateral Heart: regular rate and rhythm, S1, S2 normal, no murmur, click, rub or gallop Abdomen: soft, non-tender; bowel sounds normal; no masses,  no organomegaly Extremities: rate anterior leg has a dressing, I did not examine the ulcer.  Chronic  ischemia and ischemic changes of the skin evident.  Right foot is colder than the left, both for the cold.  There is no evidence of critical limb ischemia in the left leg.  Capillary filling is delayed  right leg. Pulses: Faint carotid bruit, femoral pulses could not be felt, a radial pulses absent bilaterally, pedal pulses absent bilaterally. Skin: please see above findings Neurologic: Grossly normal  Labs:   Lab Results  Component Value Date   WBC 6.4 02/25/2015   HGB 12.6 02/25/2015   HCT 37.5 02/25/2015   MCV 85.8 02/25/2015   PLT 145* 02/25/2015    Recent Labs Lab 02/25/15 0431  NA 138  K 4.3  CL 105  CO2 25  BUN 7  CREATININE 0.77  CALCIUM 8.7*  GLUCOSE 81    Lipid Panel     Component Value Date/Time   CHOL 144 02/25/2015 0431   TRIG 74 02/25/2015 0431   HDL 31* 02/25/2015 0431   CHOLHDL 4.6 02/25/2015 0431   VLDL 15 02/25/2015 0431   LDLCALC 98 02/25/2015 0431    EKG 02/24/2015: Normal sinus rhythm at the rate of 90 bpm, normal axis.  No evidence of ischemia, normal EKG.  Renal angiogram 01/28/2011: No evidence of renal artery stenosis.  No evidence of abdominal 80%.  Mild Disease of the Iliac vessels.   Radiology: Dg Chest 2 View  02/26/2015   CLINICAL DATA:  COPD, long-term tobacco use, peripheral vascular disease.  EXAM: CHEST  2 VIEW  COMPARISON:  AP chest x-ray dated November 29, 2011  FINDINGS: The lungs are mildly hyperinflated and clear. The heart and pulmonary vascularity are normal. The mediastinum is normal in width. There is no pleural effusion. There is stable levocurvature of the thoracolumbar  spine.  IMPRESSION: Hyperinflation consistent with COPD. There is no active cardiopulmonary disease.   Electronically Signed   By: David  Martinique M.D.   On: 02/26/2015 08:17    Scheduled Meds: . atorvastatin  80 mg Oral q1800  . baclofen  10 mg Oral QID  . cefTRIAXone (ROCEPHIN)  IV  1 g Intravenous Q24H  . docusate sodium  100 mg Oral BID  . enoxaparin (LOVENOX) injection  40 mg Subcutaneous Q24H  . magnesium oxide  400 mg Oral Daily   Continuous Infusions: . 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  I have added atorvastatin 80 mg by mouth daily, although her lipids are not wanted evaluated, for cardiovascular protection.  Unless the stress test is high risk, she can be taken up for the upcoming surgery with moderate risk for perioperative cardiovascular/pulmonary events due to her underlying comorbidities.  I have discussed with the patient regarding smoking cessation extensively. I will also add aspirin 81 mg by mouth daily.  Consider addition of clopidogrel after bypass surgery.  Adrian Prows, MD 02/26/2015, 1:09 PM [facility] Pager: [phone] Office: [phone] If no answer Cell [phone]

## 2015-02-26 NOTE — Progress Notes (Addendum)
   Daily Progress Note  Assessment/Planning: 1. POD #1 s/p Ao, BRo which demonstrated R SFA long segment occlusion, L SFA short segment occlusion 2. Chronic R calf ucler 3.  UTI   UA +: on Rocephin  Vein mapping today  PA/Lat CXR tomorrow  Cardiac consult today  Possible OR tomorrow for R fem-BK pop Bypass  Subjective  - 2 Days Post-Op  Still c/o back pain  Objective Filed Vitals:   02/25/15 0505 02/25/15 0658 02/25/15 2053 02/26/15 0647  BP: 184/85 153/79 128/72 108/62  Pulse: 102 98 105 95  Temp: 98 F (36.7 C)  98.2 F (36.8 C) 97.9 F (36.6 C)  TempSrc: Oral  Oral Oral  Resp: 18  18 18   Height:      Weight:      SpO2: 98%  100% 97%    Intake/Output Summary (Last 24 hours) at 02/26/15 0719 Last data filed at 02/25/15 1753  Gross per 24 hour  Intake   2245 ml  Output    750 ml  Net   1495 ml    PULM  CTAB CV  RRR GI  soft, NTND VASC R groin without PSA or thrill, no obvious hematoma, both feet viable, R calf ulcer unchanged  Laboratory CBC    Component Value Date/Time   WBC 6.4 02/25/2015 0431   HGB 12.6 02/25/2015 0431   HCT 37.5 02/25/2015 0431   PLT 145* 02/25/2015 0431    BMET    Component Value Date/Time   NA 138 02/25/2015 0431   K 4.3 02/25/2015 0431   CL 105 02/25/2015 0431   CO2 25 02/25/2015 0431   GLUCOSE 81 02/25/2015 0431   BUN 7 02/25/2015 0431   CREATININE 0.77 02/25/2015 0431   CALCIUM 8.7* 02/25/2015 0431   GFRNONAA >60 02/25/2015 [place] >60 02/25/2015 [place], MD [facility] Office: [phone] Pager: [phone]  02/26/2015, 7:19 AM   Addendum  Dr. Einar Gip feels the patient will need a nuclear stress test, so the R fem-pop BPG will be rescheduled to Friday to allow the testing tomorrow.  Adele Barthel, MD [facility] Office: [phone] Pager: [phone]  02/26/2015, 9:33 AM

## 2015-02-27 ENCOUNTER — Inpatient Hospital Stay (HOSPITAL_COMMUNITY): Payer: Medicare Other

## 2015-02-27 LAB — COMPREHENSIVE METABOLIC PANEL
ALBUMIN: 2.9 g/dL — AB (ref 3.5–5.0)
ALT: 8 U/L — ABNORMAL LOW (ref 14–54)
AST: 15 U/L (ref 15–41)
Alkaline Phosphatase: 63 U/L (ref 38–126)
Anion gap: 8 (ref 5–15)
BILIRUBIN TOTAL: 0.3 mg/dL (ref 0.3–1.2)
BUN: 9 mg/dL (ref 6–20)
CHLORIDE: 107 mmol/L (ref 101–111)
CO2: 23 mmol/L (ref 22–32)
Calcium: 8.5 mg/dL — ABNORMAL LOW (ref 8.9–10.3)
Creatinine, Ser: 0.82 mg/dL (ref 0.44–1.00)
GFR calc Af Amer: >60 mL/min (ref >60)
GFR calc non Af Amer: >60 mL/min (ref >60)
Glucose, Bld: 93 mg/dL (ref 65–99)
POTASSIUM: 4.1 mmol/L (ref 3.5–5.1)
Sodium: 138 mmol/L (ref 135–145)
TOTAL PROTEIN: 6.7 g/dL (ref 6.5–8.1)

## 2015-02-27 LAB — CBC
HCT: 35.9 % — ABNORMAL LOW (ref 36.0–46.0)
Hemoglobin: 11.9 g/dL — ABNORMAL LOW (ref 12.0–15.0)
MCH: 28.9 pg (ref 26.0–34.0)
MCHC: 33.1 g/dL (ref 30.0–36.0)
MCV: 87.1 fL (ref 78.0–100.0)
Platelets: 147 10*3/uL — ABNORMAL LOW (ref 150–400)
RBC: 4.12 MIL/uL (ref 3.87–5.11)
RDW: 13.9 % (ref 11.5–15.5)
WBC: 6.8 10*3/uL (ref 4.0–10.5)

## 2015-02-27 MED ORDER — REGADENOSON 0.4 MG/5ML IV SOLN
0.4000 mg | Freq: Once | INTRAVENOUS | Status: AC
Start: 2015-02-27 — End: 2015-02-27
  Administered 2015-02-27: 0.4 mg via INTRAVENOUS
  Filled 2015-02-27: qty 5

## 2015-02-27 MED ORDER — TECHNETIUM TC 99M SESTAMIBI GENERIC - CARDIOLITE
30.0000 | Freq: Once | INTRAVENOUS | Status: AC | PRN
  Administered 2015-02-27: 30 via INTRAVENOUS

## 2015-02-27 MED ORDER — REGADENOSON 0.4 MG/5ML IV SOLN
INTRAVENOUS | Status: AC
  Administered 2015-02-27: 0.4 mg via INTRAVENOUS
  Filled 2015-02-27: qty 5

## 2015-02-27 NOTE — Progress Notes (Signed)
VSS, Afebrile, WBC normal  Spoke with pt this morning as well as yesterday afternoon.  She understands that her surgery is planned for tomorrow, however, pending the results of her stress test, this could be postponed if she requires further workup and she understands this.  Briana Mosley 02/27/2015 7:46 AM

## 2015-02-27 NOTE — Progress Notes (Signed)
Medicare Important Message given? YES  (If response is "NO", the following Medicare IM given date fields will be blank)  Date Medicare IM given: 02/27/15 Medicare IM given by:  Ciarra Braddy  

## 2015-02-27 NOTE — Progress Notes (Signed)
Subjective:  No chest pain or dyspnea.   Objective:  Vital Signs in the last 24 hours: Temp:  [97.9 F (36.6 C)-98.5 F (36.9 C)] 98.1 F (36.7 C) (05/26 1356) Pulse Rate:  [81-126] 81 (05/26 1356) Resp:  [17-18] 17 (05/26 1356) BP: (117-174)/(55-82) 127/58 mmHg (05/26 1356) SpO2:  [96 %-100 %] 100 % (05/26 1356)  Intake/Output from previous day: 05/25 0701 - 05/26 0700 In: 840 [P.O.:840] Out: -   Physical Exam:   General appearance: alert, cooperative, appears older than stated age and no distress Lungs: wheezes bilaterally and extensive crackles bilateral Heart: regular rate and rhythm, S1, S2 normal, no murmur, click, rub or gallop Abdomen: soft, non-tender; bowel sounds normal; no masses, no organomegaly Extremities: rate anterior leg has a dressing, I did not examine the ulcer. Chronic ischemia and ischemic changes of the skin evident. Right foot is colder than the left, both for the cold. There is no evidence of critical limb ischemia in the left leg. Capillary filling is delayed right leg. Pulses: Faint carotid bruit, femoral pulses could not be felt, a radial pulses absent bilaterally, pedal pulses absent bilaterally. Skin: please see above findings Neurologic: Grossly normal Lab Results: BMP  Recent Labs  02/24/15 1512 02/24/15 1942 02/25/15 0431 02/27/15 0450  NA 139  --  138 138  K 3.8  --  4.3 4.1  CL 108  --  105 107  CO2  --   --  25 23  GLUCOSE 86  --  81 93  BUN 13  --  7 9  CREATININE 0.70 0.80 0.77 0.82  CALCIUM  --   --  8.7* 8.5*  GFRNONAA  --  >60 >60 >60  GFRAA  --  >60 >60 >60    CBC  Recent Labs Lab 02/27/15 0450  WBC 6.8  RBC 4.12  HGB 11.9*  HCT 35.9*  PLT 147*  MCV 87.1  MCH 28.9  MCHC 33.1  RDW 13.9    HEMOGLOBIN A1C No results found for: HGBA1C, MPG   CHOLESTEROL  Recent Labs  02/25/15 0431 02/26/15 1135  CHOL 144 145    Hepatic Function Panel  Recent Labs  02/27/15 0450  PROT 6.7  ALBUMIN 2.9*   AST 15  ALT 8*  ALKPHOS 63  BILITOT 0.3    Cardiac Studies:  EKG 02/24/2015: Normal sinus rhythm at the rate of 90 bpm, normal axis. No evidence of ischemia, normal EKG.  Renal angiogram 01/28/2011: No evidence of renal artery stenosis. No evidence of abdominal 80%. Mild Disease of the Iliac vessels.  Lexiscan Myoview stress test 02/27/2015: Findings consistent with ischemia. This is an intermediate risk study. The left ventricular ejection fraction is normal (55-65%). There is a small defect of mild severity present in the mid anterior, apical anterior and apical septal location.  Assessment/Plan:  1.  Severe peripheral arterial disease with  Critical limb ischemia, non-limb threatening, with nonhealing foot ulceration/leg ulceration. 2.  Abnormal nuclear stress test revealing anterior wall ischemia 3.  Severe COPD 4.  Hyperlipidemia  Recommendation: Patient has at least an intermediate risk and with anterior wall ischemia by nuclear stress testing.  I discussed with class the surgery, patient has chronic ulceration and chronic CLI, non-limb threatening.  No urgency in proceeding with vascular bypass surgery.  Hence I'll schedule the patient for coronary angiography in the morning, and make final recommendation.  Patient can be on aspirin and Plavix even if she is going for lower Ixodes bypass surgery per Dr.  Bridgett Larsson.  I have discussed with the patient in presence of the nurse regarding the abnormal stress test and need for coronary angiography, multivessel disease and ostial LAD disease cannot be completely excluded, clinically she is extremely high risk.  I have discussed the risks associated with coronary angiography including but not limited to less than 1% risk of death, stroke, MI, need for urgent CABG, bleeding, infection..  Patient is willing to proceed.   Adrian Prows, M.D. 02/27/2015, 6:17 PM [facility] Pager: [phone] Office: [phone] If no answer:  [phone]

## 2015-02-28 ENCOUNTER — Encounter (HOSPITAL_COMMUNITY)
Admission: RE | Disposition: A | Discharge status: Home / Self Care | Payer: Medicare Other | Source: Ambulatory Visit | Attending: Vascular Surgery

## 2015-02-28 ENCOUNTER — Encounter (HOSPITAL_COMMUNITY): Payer: Self-pay | Admitting: Cardiology

## 2015-02-28 ENCOUNTER — Encounter (HOSPITAL_COMMUNITY)
Admission: RE | Disposition: A | Discharge status: Home / Self Care | Payer: Self-pay | Source: Ambulatory Visit | Attending: Vascular Surgery

## 2015-02-28 HISTORY — PX: CARDIAC CATHETERIZATION: SHX172

## 2015-02-28 LAB — SURGICAL PCR SCREEN
MRSA, PCR: NEGATIVE
STAPHYLOCOCCUS AUREUS: NEGATIVE

## 2015-02-28 LAB — APTT: APTT: 33 s (ref 24–37)

## 2015-02-28 LAB — PROTIME-INR
INR: 1.13 (ref 0.00–1.49)
PROTHROMBIN TIME: 14.7 s (ref 11.6–15.2)

## 2015-02-28 SURGERY — LEFT HEART CATH AND CORONARY ANGIOGRAPHY
Anesthesia: LOCAL

## 2015-02-28 SURGERY — BYPASS GRAFT FEMORAL-POPLITEAL ARTERY
Anesthesia: General | Laterality: Right

## 2015-02-28 MED ORDER — SODIUM CHLORIDE 0.9 % WEIGHT BASED INFUSION
3.0000 mL/kg/h | INTRAVENOUS | Status: DC
  Administered 2015-02-28: 3 mL/kg/h via INTRAVENOUS

## 2015-02-28 MED ORDER — SODIUM CHLORIDE 0.9 % IJ SOLN
3.0000 mL | Freq: Two times a day (BID) | INTRAMUSCULAR | Status: DC
  Administered 2015-02-28: 3 mL via INTRAVENOUS

## 2015-02-28 MED ORDER — SODIUM CHLORIDE 0.9 % IV SOLN: 250.0000 mL | INTRAVENOUS | Status: DC | PRN

## 2015-02-28 MED ORDER — HYDROMORPHONE HCL 1 MG/ML IJ SOLN
INTRAMUSCULAR | Status: AC
  Filled 2015-02-28: qty 1

## 2015-02-28 MED ORDER — ASPIRIN EC 325 MG PO TBEC: 325.0000 mg | DELAYED_RELEASE_TABLET | Freq: Every day | ORAL | Status: DC

## 2015-02-28 MED ORDER — LIDOCAINE HCL (PF) 1 % IJ SOLN
INTRAMUSCULAR | Status: AC
  Filled 2015-02-28: qty 30

## 2015-02-28 MED ORDER — ASPIRIN 81 MG PO CHEW
81.0000 mg | CHEWABLE_TABLET | Freq: Once | ORAL | Status: AC
  Administered 2015-02-28: 81 mg via ORAL
  Filled 2015-02-28: qty 1

## 2015-02-28 MED ORDER — SODIUM CHLORIDE 0.9 % IJ SOLN: 3.0000 mL | Freq: Two times a day (BID) | INTRAMUSCULAR | Status: DC

## 2015-02-28 MED ORDER — HEPARIN (PORCINE) IN NACL 2-0.9 UNIT/ML-% IJ SOLN
INTRAMUSCULAR | Status: AC
  Filled 2015-02-28: qty 1500

## 2015-02-28 MED ORDER — OXYCODONE HCL 5 MG PO TABS: 5.0000 mg | ORAL_TABLET | ORAL | Status: DC | PRN

## 2015-02-28 MED ORDER — MIDAZOLAM HCL 2 MG/2ML IJ SOLN
INTRAMUSCULAR | Status: AC
  Filled 2015-02-28: qty 2

## 2015-02-28 MED ORDER — OXYCODONE HCL 10 MG PO TABS: 10.0000 mg | ORAL_TABLET | Freq: Four times a day (QID) | ORAL | Status: AC | PRN

## 2015-02-28 MED ORDER — IOHEXOL 350 MG/ML SOLN
INTRAVENOUS | Status: DC | PRN
  Administered 2015-02-28: 35 mL via INTRA_ARTERIAL

## 2015-02-28 MED ORDER — SODIUM CHLORIDE 0.9 % WEIGHT BASED INFUSION: 3.0000 mL/kg/h | INTRAVENOUS | Status: AC

## 2015-02-28 MED ORDER — HEPARIN SODIUM (PORCINE) 1000 UNIT/ML IJ SOLN
INTRAMUSCULAR | Status: DC | PRN
  Administered 2015-02-28: 4000 [IU] via INTRAVENOUS

## 2015-02-28 MED ORDER — ASPIRIN EC 81 MG PO TBEC: 81.0000 mg | DELAYED_RELEASE_TABLET | Freq: Every day | ORAL | Status: AC

## 2015-02-28 MED ORDER — VERAPAMIL HCL 2.5 MG/ML IV SOLN
INTRAVENOUS | Status: AC
  Filled 2015-02-28: qty 2

## 2015-02-28 MED ORDER — NITROGLYCERIN 1 MG/10 ML FOR IR/CATH LAB
INTRA_ARTERIAL | Status: AC
  Filled 2015-02-28: qty 10

## 2015-02-28 MED ORDER — MIDAZOLAM HCL 2 MG/2ML IJ SOLN
INTRAMUSCULAR | Status: DC | PRN
  Administered 2015-02-28: 2 mg via INTRAVENOUS

## 2015-02-28 MED ORDER — VERAPAMIL HCL 2.5 MG/ML IV SOLN
INTRAVENOUS | Status: DC | PRN
  Administered 2015-02-28: 08:00:00 via INTRA_ARTERIAL

## 2015-02-28 MED ORDER — HYDROMORPHONE HCL 1 MG/ML IJ SOLN
INTRAMUSCULAR | Status: DC | PRN
Start: 2015-02-28 — End: 2015-02-28
  Administered 2015-02-28: 0.5 mg via INTRAVENOUS

## 2015-02-28 MED ORDER — SODIUM CHLORIDE 0.9 % IJ SOLN: 3.0000 mL | INTRAMUSCULAR | Status: DC | PRN

## 2015-02-28 MED ORDER — SODIUM CHLORIDE 0.9 % WEIGHT BASED INFUSION: 1.0000 mL/kg/h | INTRAVENOUS | Status: DC

## 2015-02-28 SURGICAL SUPPLY — 11 items
CATH INFINITI 5FR MPB2 (CATHETERS) IMPLANT
CATH OPTITORQUE TIG 4.0 5F (CATHETERS) ×2 IMPLANT
DEVICE RAD COMP TR BAND LRG (VASCULAR PRODUCTS) ×2 IMPLANT
GLIDESHEATH SLEND A-KIT 6F 20G (SHEATH) ×2 IMPLANT
KIT HEART LEFT (KITS) ×2 IMPLANT
PACK CARDIAC CATHETERIZATION (CUSTOM PROCEDURE TRAY) ×2 IMPLANT
SHEATH PINNACLE 5F 10CM (SHEATH) IMPLANT
TRANSDUCER W/STOPCOCK (MISCELLANEOUS) ×2 IMPLANT
TUBING CIL FLEX 10 FLL-RA (TUBING) ×2 IMPLANT
WIRE EMERALD 3MM-J .035X150CM (WIRE) IMPLANT
WIRE SAFE-T 1.5MM-J .035X260CM (WIRE) ×2 IMPLANT

## 2015-02-28 NOTE — Progress Notes (Signed)
Pt sent down to cath lab for  Cardiac catheterization.

## 2015-02-28 NOTE — Care Management Note (Addendum)
Case Management Note  Patient Details  Name: Briana Mosley MRN: [id number] Date of Birth: [birth year]  Subjective/Objective:     Pt admitted with critical ischemia of lower leg-, +stress test- for cardiac cath 02/28/15- with plans for OR on 03/03/15 with vascular               Action/Plan: PTA pt lived at home-- NCM to follow post op for d/c needs  Expected Discharge Date:                  Expected Discharge Plan:  [place]  In-House Referral:     Discharge planning Services  CM Consult  Post Acute Care Choice:    Choice offered to:     DME Arranged:    DME Agency:     HH Arranged:    HH Agency:     Status of Service:  In process, will continue to follow  Medicare Important Message Given:    Date Medicare IM Given:    Medicare IM give by:    Date Additional Medicare IM Given:    Additional Medicare Important Message give by:     If discussed at [place] of Stay Meetings, dates discussed:    Additional Comments:  Dawayne Patricia, RN 02/28/2015, 11:24 AM

## 2015-02-28 NOTE — Interval H&P Note (Signed)
History and Physical Interval Note:  02/28/2015 8:10 AM  Briana Mosley  has presented today for surgery, with the diagnosis of cp  The various methods of treatment have been discussed with the patient and family. After consideration of risks, benefits and other options for treatment, the patient has consented to  Procedure(s): Left Heart Cath and Coronary Angiography (N/A) and possible PTCA as a surgical intervention .  The patient's history has been reviewed, patient examined, no change in status, stable for surgery.  I have reviewed the patient's chart and labs.  Questions were answered to the patient's satisfaction.   Ischemic Symptoms? Asymptomatic (No ischemic symptoms) Anti-ischemic Medical Therapy? No Therapy Non-invasive Test Results? Intermediate-risk stress test findings: cardiac mortality 1-3%/year Prior CABG? No Previous CABG   Patient Information:   1-2V CAD, no prox LAD  I (3)  Indication: 16; Score: 3   Patient Information:   CTO of 1 vessel, no other CAD  I (3)  Indication: 26; Score: 3   Patient Information:   1V CAD with prox LAD  U (4)  Indication: 32; Score: 4   Patient Information:   2V-CAD with prox LAD  U (5)  Indication: 38; Score: 5   Patient Information:   3V-CAD without LMCA  A (7)  Indication: 44; Score: 7   Patient Information:   3V-CAD without LMCA With Abnormal LV systolic function  A (8)  Indication: 48; Score: 8   Patient Information:   LMCA-CAD  A (9)  Indication: 49; Score: 9   Patient Information:   2V-CAD with prox LAD PCI  A (7)  Indication: 62; Score: 7   Patient Information:   2V-CAD with prox LAD CABG  A (8)  Indication: 62; Score: 8   Patient Information:   3V-CAD without LMCA With Low CAD burden(i.e., 3 focal stenoses, low SYNTAX score) PCI  A (7)  Indication: 63; Score: 7   Patient Information:   3V-CAD without LMCA With Low CAD burden(i.e., 3 focal stenoses, low SYNTAX  score) CABG  A (9)  Indication: 63; Score: 9   Patient Information:   3V-CAD without LMCA E06c - Intermediate-high CAD burden (i.e., multiple diffuse lesions, presence of CTO, or high SYNTAX score) PCI  U (4)  Indication: 64; Score: 4   Patient Information:   3V-CAD without LMCA E06c - Intermediate-high CAD burden (i.e., multiple diffuse lesions, presence of CTO, or high SYNTAX score) CABG  A (9)  Indication: 64; Score: 9   Patient Information:   LMCA-CAD With Isolated LMCA stenosis  PCI  U (6)  Indication: 65; Score: 6   Patient Information:   LMCA-CAD With Isolated LMCA stenosis  CABG  A (9)  Indication: 65; Score: 9   Patient Information:   LMCA-CAD Additional CAD, low CAD burden (i.e., 1- to 2-vessel additional involvement, low SYNTAX score) PCI  U (5)  Indication: 66; Score: 5   Patient Information:   LMCA-CAD Additional CAD, low CAD burden (i.e., 1- to 2-vessel additional involvement, low SYNTAX score) CABG  A (9)  Indication: 66; Score: 9   Patient Information:   LMCA-CAD Additional CAD, intermediate-high CAD burden (i.e., 3-vessel involvement, presence of CTO, or high SYNTAX score) PCI  I (3)  Indication: 67; Score: 3   Patient Information:   LMCA-CAD Additional CAD, intermediate-high CAD burden (i.e., 3-vessel involvement, presence of CTO, or high SYNTAX score) CABG  A (9)  Indication: 67; Score: 9   Briana Mosley

## 2015-02-28 NOTE — Progress Notes (Signed)
Pt came from Cath lab, alert and oriented x4, vitals stable, Rt radial pulse palpable, no bruising or bleeding. Cath site monitored per order, will continue to monitor pt. Report received from Amy in regards to pt current medical status.

## 2015-02-28 NOTE — Progress Notes (Addendum)
   Daily Progress Note  Assessment/Planning: RLE CI, CAD   Nuclear stress test + for ischemia  Per Dr. Einar Gip, cardiac cath today.  Will aim for Monday for R fem-pop bypass with ips GSV  Subjective    No significant changes  Objective Filed Vitals:   02/27/15 1356 02/27/15 2017 02/28/15 0344 02/28/15 0811  BP: 127/58 154/56 110/57   Pulse: 81 94 77   Temp: 98.1 F (36.7 C) 98.3 F (36.8 C) 98.2 F (36.8 C)   TempSrc: Oral Oral Oral   Resp: 17 18 18    Height:      Weight:      SpO2: 100% 100% 99% 98%    Intake/Output Summary (Last 24 hours) at 02/28/15 0823 Last data filed at 02/28/15 1610  Gross per 24 hour  Intake 5801.67 ml  Output      0 ml  Net 5801.67 ml    PULM  CTAB CV  RRR GI  soft, NTND VASC  R foot viable, R calf bandaged  Laboratory CBC    Component Value Date/Time   WBC 6.8 02/27/2015 0450   HGB 11.9* 02/27/2015 0450   HCT 35.9* 02/27/2015 0450   PLT 147* 02/27/2015 0450    BMET    Component Value Date/Time   NA 138 02/27/2015 0450   K 4.1 02/27/2015 0450   CL 107 02/27/2015 0450   CO2 23 02/27/2015 0450   GLUCOSE 93 02/27/2015 0450   BUN 9 02/27/2015 0450   CREATININE 0.82 02/27/2015 0450   CALCIUM 8.5* 02/27/2015 0450   GFRNONAA >60 02/27/2015 0450   GFRAA >60 02/27/2015 [place], MD [facility] Office: [phone] Pager: [phone]  02/28/2015, 8:23 AM

## 2015-02-28 NOTE — Discharge Summary (Signed)
Discharge Summary    Briana Mosley [birth year] 49 y.o. female  [id number]  Admission Date: 02/24/2015  Discharge Date: 02/28/15  Physician: Conrad Norwalk, MD  Admission Diagnosis: Peripheral artery disease I70.92 Peripheral vascular disease with right leg ulcer I70.239 cp Peripheral vascular disease with right leg ulcer I70.239   HPI:   This is a 49 y.o. female  who presents for evaluation of carotid stenosis. The patient denies symptoms of TIA, amaurosis, or stroke. The patient is currently on no antiplatelet therapy. The carotid stenosis was found yearly exam. The patient denies any events associated with her recent L ICA occlusion. Other medical problems include Right lateral leg wound 3 months old. She is non ambulatory for 4 years and primary mode of transpotation is WC. She notes chronic B leg swelling. She has under wound care previous in a wound care center but currently is getting wound care at home with nursing. She has HTN, hypokalemia, anxiety, chronic tobacco abuse 2 packs per day for 30+ years and history of lumbar and cervical DDD. She was currently taken off her Beta Blocker and ACE I.   She followed up in the office for ABI's on 02/21/15, which revealed: Right: 0.32 Left: 0.66  She was scheduled for Aortogram with BLE runoff on Monday 02/24/15.  Hospital Course:  The patient was admitted to the hospital and taken to the operating room on 02/24/2015 and underwent: 1. Right common femoral artery cannulation under ultrasound guidance 2. Placement of catheter in aorta 3. Aortogram 4. Second order arterial selection 5. Right leg runoff via sheath 6. Left leg runoff via catheter    The pt tolerated the procedure well and was transported to the PACU in good condition.   Pt was admitted to the hospital and a cardiology consult was obtained.  Vein mapping was obtained.    A u/a was obtained and was positive.  Given her allergies, it was  discussed with pharmacy and she was given a 3 day course of IV Rocephin.  Pt underwent Lexiscan Myoview stress test.  She was found to be at least moderate risk with anterior wall ischemia by nuclear stress testing.  On 02/28/15, she was taken for cardiac catheterization, which was clean.  She is discharged home to continue her current wound care.  She will have right femoral to popliteal bypass grafting next week.  The remainder of the hospital course consisted of increasing mobilization and increasing intake of solids without difficulty.  CBC    Component Value Date/Time   WBC 6.8 02/27/2015 0450   RBC 4.12 02/27/2015 0450   HGB 11.9* 02/27/2015 0450   HCT 35.9* 02/27/2015 0450   PLT 147* 02/27/2015 0450   MCV 87.1 02/27/2015 0450   MCH 28.9 02/27/2015 0450   MCHC 33.1 02/27/2015 0450   RDW 13.9 02/27/2015 0450   LYMPHSABS 1.7 11/29/2011 1001   MONOABS 0.5 11/29/2011 1001   EOSABS 0.1 11/29/2011 1001   BASOSABS 0.0 11/29/2011 1001    BMET    Component Value Date/Time   NA 138 02/27/2015 0450   K 4.1 02/27/2015 0450   CL 107 02/27/2015 0450   CO2 23 02/27/2015 0450   GLUCOSE 93 02/27/2015 0450   BUN 9 02/27/2015 0450   CREATININE 0.82 02/27/2015 0450   CALCIUM 8.5* 02/27/2015 0450   GFRNONAA >60 02/27/2015 0450   GFRAA >60 02/27/2015 0450          Discharge Instructions    Call MD for:  redness, tenderness, or  signs of infection (pain, swelling, bleeding, redness, odor or green/yellow discharge around incision site)    Complete by:  As directed      Call MD for:  severe or increased pain, loss or decreased feeling  in affected limb(s)    Complete by:  As directed      Call MD for:  temperature >100.5    Complete by:  As directed      Discharge instructions    Complete by:  As directed   You may shower tomorrow.  Slowly in crease your activity.  Our office will call you about Surgery Thursday 02/03/2015.     Increase activity slowly    Complete by:  As directed     Walk with assistance use walker or cane as needed     Lifting restrictions    Complete by:  As directed   No lifting for 6 weeks     Resume previous diet    Complete by:  As directed            Discharge Diagnosis:  Peripheral artery disease I70.92 Peripheral vascular disease with right leg ulcer I70.239 cp Peripheral vascular disease with right leg ulcer I70.239  Secondary Diagnosis: Patient Active Problem List   Diagnosis Date Noted  . Critical lower limb ischemia 02/21/2015  . Carotid artery occlusion 01/27/2015  . Carotid stenosis 01/27/2015  . Chronic venous insufficiency 01/27/2015  . Venous stasis ulcer of ankle 01/27/2015  . Hallucinations 11/29/2011  . Confusion 11/29/2011  . Hypertension 11/29/2011  . Back pain 11/29/2011   Past Medical History  Diagnosis Date  . Chronic back pain   . Hypertension   . Thyroid disease     hyperthyroidism  . Lower extremity weakness   . Anxiety   . GERD (gastroesophageal reflux disease)     otc med if needed       Medication List    STOP taking these medications        oxyCODONE-acetaminophen 5-325 MG per tablet  Commonly known as:  PERCOCET/ROXICET      TAKE these medications        ALPRAZolam 0.5 MG tablet  Commonly known as:  XANAX  Take 0.5 tablets (0.25 mg total) by mouth 3 (three) times daily as needed for anxiety.     aspirin EC 81 MG tablet  Take 1 tablet (81 mg total) by mouth daily.     baclofen 10 MG tablet  Commonly known as:  LIORESAL  Take 10 mg by mouth 4 (four) times daily.     clonazePAM 0.5 MG tablet  Commonly known as:  KLONOPIN  Take 0.5 mg by mouth 3 (three) times daily as needed for anxiety.     magnesium oxide 400 MG tablet  Commonly known as:  MAG-OX  Take 400 mg by mouth daily.     Oxycodone HCl 10 MG Tabs  Take 1 tablet (10 mg total) by mouth 4 (four) times daily as needed (pain).        Prescriptions given: 1.  Oxycodone 10mg  #30 No Refill 2.  Start baby aspirin  daily  Instructions: 1.  Continue current wound care  Disposition: home  Patient's condition: is Good   Follow up: 1. Pt will come back to hospital Thursday for right femoral to popliteal bypass grafting with ipsilateral vein.   Leontine Locket, PA-C [facility] [phone] 02/28/2015  1:58 PM   Addendum  I have independently interviewed and examined the patient, and I  agree with the physician assistant's discharge summary.  This patient was admitted post-angiogram for cardiac optimization and vein mapping.  Her cardiologist, Dr. Einar Gip felt she needed a nuclear stress test.  This turned out to be positive, which lead to a cardiac catheterization which turned out to be negative.  Due to the upcoming Memorial Day holiday, the patient cannot be schedule for her right femoropopliteal bypass until this coming Thursday, Mar 06, 2015.  She will return at that point for her operation.  Adele Barthel, MD [facility] [place] Office: [phone] Pager: [phone]  02/28/2015, 2:03 PM

## 2015-02-28 NOTE — Progress Notes (Signed)
Utilization review completed.  

## 2015-02-28 NOTE — H&P (View-Only) (Signed)
Subjective:  No chest pain or dyspnea.   Objective:  Vital Signs in the last 24 hours: Temp:  [97.9 F (36.6 C)-98.5 F (36.9 C)] 98.1 F (36.7 C) (05/26 1356) Pulse Rate:  [81-126] 81 (05/26 1356) Resp:  [17-18] 17 (05/26 1356) BP: (117-174)/(55-82) 127/58 mmHg (05/26 1356) SpO2:  [96 %-100 %] 100 % (05/26 1356)  Intake/Output from previous day: 05/25 0701 - 05/26 0700 In: 840 [P.O.:840] Out: -   Physical Exam:   General appearance: alert, cooperative, appears older than stated age and no distress Lungs: wheezes bilaterally and extensive crackles bilateral Heart: regular rate and rhythm, S1, S2 normal, no murmur, click, rub or gallop Abdomen: soft, non-tender; bowel sounds normal; no masses, no organomegaly Extremities: rate anterior leg has a dressing, I did not examine the ulcer. Chronic ischemia and ischemic changes of the skin evident. Right foot is colder than the left, both for the cold. There is no evidence of critical limb ischemia in the left leg. Capillary filling is delayed right leg. Pulses: Faint carotid bruit, femoral pulses could not be felt, a radial pulses absent bilaterally, pedal pulses absent bilaterally. Skin: please see above findings Neurologic: Grossly normal Lab Results: BMP  Recent Labs  02/24/15 1512 02/24/15 1942 02/25/15 0431 02/27/15 0450  NA 139  --  138 138  K 3.8  --  4.3 4.1  CL 108  --  105 107  CO2  --   --  25 23  GLUCOSE 86  --  81 93  BUN 13  --  7 9  CREATININE 0.70 0.80 0.77 0.82  CALCIUM  --   --  8.7* 8.5*  GFRNONAA  --  >60 >60 >60  GFRAA  --  >60 >60 >60    CBC  Recent Labs Lab 02/27/15 0450  WBC 6.8  RBC 4.12  HGB 11.9*  HCT 35.9*  PLT 147*  MCV 87.1  MCH 28.9  MCHC 33.1  RDW 13.9    HEMOGLOBIN A1C No results found for: HGBA1C, MPG   CHOLESTEROL  Recent Labs  02/25/15 0431 02/26/15 1135  CHOL 144 145    Hepatic Function Panel  Recent Labs  02/27/15 0450  PROT 6.7  ALBUMIN 2.9*   AST 15  ALT 8*  ALKPHOS 63  BILITOT 0.3    Cardiac Studies:  EKG 02/24/2015: Normal sinus rhythm at the rate of 90 bpm, normal axis. No evidence of ischemia, normal EKG.  Renal angiogram 01/28/2011: No evidence of renal artery stenosis. No evidence of abdominal 80%. Mild Disease of the Iliac vessels.  Lexiscan Myoview stress test 02/27/2015: Findings consistent with ischemia. This is an intermediate risk study. The left ventricular ejection fraction is normal (55-65%). There is a small defect of mild severity present in the mid anterior, apical anterior and apical septal location.  Assessment/Plan:  1.  Severe peripheral arterial disease with  Critical limb ischemia, non-limb threatening, with nonhealing foot ulceration/leg ulceration. 2.  Abnormal nuclear stress test revealing anterior wall ischemia 3.  Severe COPD 4.  Hyperlipidemia  Recommendation: Patient has at least an intermediate risk and with anterior wall ischemia by nuclear stress testing.  I discussed with class the surgery, patient has chronic ulceration and chronic CLI, non-limb threatening.  No urgency in proceeding with vascular bypass surgery.  Hence I'll schedule the patient for coronary angiography in the morning, and make final recommendation.  Patient can be on aspirin and Plavix even if she is going for lower Ixodes bypass surgery per Dr.  Bridgett Larsson.  I have discussed with the patient in presence of the nurse regarding the abnormal stress test and need for coronary angiography, multivessel disease and ostial LAD disease cannot be completely excluded, clinically she is extremely high risk.  I have discussed the risks associated with coronary angiography including but not limited to less than 1% risk of death, stroke, MI, need for urgent CABG, bleeding, infection..  Patient is willing to proceed.   Adrian Prows, M.D. 02/27/2015, 6:17 PM [facility] Pager: [phone] Office: [phone] If no answer:  [phone]

## 2015-02-28 NOTE — Progress Notes (Signed)
TR band removed and pressure bandage applied, no swelling or bleeding noted. Pt D/C home per MD order, D/C instructions review with pt and all querstions answered. Pt given presciption order for oxycodone and aspirin, pt aware of follow up appt.

## 2015-03-04 ENCOUNTER — Other Ambulatory Visit: Payer: Self-pay

## 2015-03-04 MED FILL — Heparin Sodium (Porcine) 2 Unit/ML in Sodium Chloride 0.9%: INTRAMUSCULAR | Qty: 1500 | Status: AC

## 2015-03-04 MED FILL — Lidocaine HCl Local Preservative Free (PF) Inj 1%: INTRAMUSCULAR | Qty: 30 | Status: AC

## 2015-03-05 ENCOUNTER — Encounter (HOSPITAL_COMMUNITY): Payer: Self-pay | Admitting: *Deleted

## 2015-03-05 MED ORDER — DEXTROSE 5 % IV SOLN
1.5000 g | INTRAVENOUS | Status: AC
  Administered 2015-03-06 (×2): 1.5 g via INTRAVENOUS
  Filled 2015-03-05: qty 1.5

## 2015-03-05 MED ORDER — SODIUM CHLORIDE 0.9 % IV SOLN: INTRAVENOUS | Status: DC

## 2015-03-05 NOTE — Progress Notes (Signed)
Pt denies SOB and chest pain. Pt made aware to stop NSAIDs, otc vitamins and herbal medications. Pt verbalized understanding of all pre-op instructions.

## 2015-03-06 ENCOUNTER — Inpatient Hospital Stay (HOSPITAL_COMMUNITY): Payer: Medicare Other | Admitting: Certified Registered Nurse Anesthetist

## 2015-03-06 ENCOUNTER — Inpatient Hospital Stay (HOSPITAL_COMMUNITY)
Admission: RE | Admit: 2015-03-06 | Discharge: 2015-03-09 | DRG: 253 | Disposition: A | Discharge status: Home Health | Payer: Medicare Other | Source: Ambulatory Visit | Attending: Vascular Surgery | Admitting: Vascular Surgery

## 2015-03-06 ENCOUNTER — Encounter (HOSPITAL_COMMUNITY)
Admission: RE | Disposition: A | Discharge status: Home Health | Payer: Self-pay | Source: Ambulatory Visit | Attending: Vascular Surgery

## 2015-03-06 ENCOUNTER — Encounter (HOSPITAL_COMMUNITY): Payer: Self-pay | Admitting: Anesthesiology

## 2015-03-06 DIAGNOSIS — K219 Gastro-esophageal reflux disease without esophagitis: Secondary | ICD-10-CM | POA: Diagnosis not present

## 2015-03-06 DIAGNOSIS — I70238 Atherosclerosis of native arteries of right leg with ulceration of other part of lower right leg: Principal | ICD-10-CM | POA: Diagnosis present

## 2015-03-06 DIAGNOSIS — M549 Dorsalgia, unspecified: Secondary | ICD-10-CM | POA: Diagnosis not present

## 2015-03-06 DIAGNOSIS — I872 Venous insufficiency (chronic) (peripheral): Secondary | ICD-10-CM | POA: Diagnosis present

## 2015-03-06 DIAGNOSIS — I743 Embolism and thrombosis of arteries of the lower extremities: Secondary | ICD-10-CM | POA: Diagnosis present

## 2015-03-06 DIAGNOSIS — E0590 Thyrotoxicosis, unspecified without thyrotoxic crisis or storm: Secondary | ICD-10-CM | POA: Diagnosis present

## 2015-03-06 DIAGNOSIS — I70221 Atherosclerosis of native arteries of extremities with rest pain, right leg: Secondary | ICD-10-CM | POA: Diagnosis not present

## 2015-03-06 DIAGNOSIS — S81809A Unspecified open wound, unspecified lower leg, initial encounter: Secondary | ICD-10-CM | POA: Diagnosis present

## 2015-03-06 DIAGNOSIS — S81801A Unspecified open wound, right lower leg, initial encounter: Secondary | ICD-10-CM | POA: Diagnosis present

## 2015-03-06 DIAGNOSIS — I10 Essential (primary) hypertension: Secondary | ICD-10-CM | POA: Diagnosis present

## 2015-03-06 HISTORY — PX: FEMORAL-POPLITEAL BYPASS GRAFT: SHX937

## 2015-03-06 LAB — CBC
HCT: 33.9 % — ABNORMAL LOW (ref 36.0–46.0)
Hemoglobin: 11.2 g/dL — ABNORMAL LOW (ref 12.0–15.0)
MCH: 28.6 pg (ref 26.0–34.0)
MCHC: 33 g/dL (ref 30.0–36.0)
MCV: 86.5 fL (ref 78.0–100.0)
PLATELETS: 211 10*3/uL (ref 150–400)
RBC: 3.92 MIL/uL (ref 3.87–5.11)
RDW: 13.9 % (ref 11.5–15.5)
WBC: 11.4 10*3/uL — AB (ref 4.0–10.5)

## 2015-03-06 LAB — CREATININE, SERUM
CREATININE: 0.8 mg/dL (ref 0.44–1.00)
GFR calc Af Amer: >60 mL/min (ref >60)
GFR calc non Af Amer: >60 mL/min (ref >60)

## 2015-03-06 SURGERY — BYPASS GRAFT FEMORAL-POPLITEAL ARTERY
Anesthesia: General | Site: Leg Upper | Laterality: Right

## 2015-03-06 MED ORDER — FENTANYL CITRATE (PF) 250 MCG/5ML IJ SOLN
INTRAMUSCULAR | Status: AC
  Filled 2015-03-06: qty 5

## 2015-03-06 MED ORDER — BACLOFEN 10 MG PO TABS
10.0000 mg | ORAL_TABLET | Freq: Four times a day (QID) | ORAL | Status: DC
  Administered 2015-03-07 – 2015-03-09 (×10): 10 mg via ORAL
  Filled 2015-03-06 (×17): qty 1

## 2015-03-06 MED ORDER — ROCURONIUM BROMIDE 50 MG/5ML IV SOLN
INTRAVENOUS | Status: AC
  Filled 2015-03-06: qty 1

## 2015-03-06 MED ORDER — METOPROLOL TARTRATE 1 MG/ML IV SOLN
2.0000 mg | INTRAVENOUS | Status: DC | PRN
  Filled 2015-03-06: qty 5

## 2015-03-06 MED ORDER — SODIUM CHLORIDE 0.9 % IR SOLN
Status: DC | PRN
  Administered 2015-03-06: 07:00:00

## 2015-03-06 MED ORDER — PROPOFOL 10 MG/ML IV BOLUS
INTRAVENOUS | Status: AC
  Filled 2015-03-06: qty 20

## 2015-03-06 MED ORDER — HYDRALAZINE HCL 20 MG/ML IJ SOLN: 5.0000 mg | INTRAMUSCULAR | Status: DC | PRN

## 2015-03-06 MED ORDER — HYDROMORPHONE HCL 1 MG/ML IJ SOLN
INTRAMUSCULAR | Status: AC
  Administered 2015-03-06: 0.5 mg via INTRAVENOUS
  Filled 2015-03-06: qty 1

## 2015-03-06 MED ORDER — ENOXAPARIN SODIUM 30 MG/0.3ML ~~LOC~~ SOLN
30.0000 mg | SUBCUTANEOUS | Status: DC
  Filled 2015-03-06: qty 0.3

## 2015-03-06 MED ORDER — ASPIRIN EC 81 MG PO TBEC
81.0000 mg | DELAYED_RELEASE_TABLET | Freq: Every day | ORAL | Status: DC
  Administered 2015-03-07 – 2015-03-09 (×3): 81 mg via ORAL
  Filled 2015-03-06 (×3): qty 1

## 2015-03-06 MED ORDER — SODIUM CHLORIDE 0.9 % IV SOLN: 500.0000 mL | Freq: Once | INTRAVENOUS | Status: AC | PRN

## 2015-03-06 MED ORDER — MORPHINE SULFATE 2 MG/ML IJ SOLN
2.0000 mg | INTRAMUSCULAR | Status: DC | PRN
  Administered 2015-03-07: 2 mg via INTRAVENOUS
  Administered 2015-03-07: 1 mg via INTRAVENOUS
  Filled 2015-03-06 (×2): qty 1

## 2015-03-06 MED ORDER — ONDANSETRON HCL 4 MG/2ML IJ SOLN
INTRAMUSCULAR | Status: DC | PRN
  Administered 2015-03-06: 4 mg via INTRAVENOUS

## 2015-03-06 MED ORDER — OXYCODONE HCL 5 MG PO TABS
10.0000 mg | ORAL_TABLET | Freq: Four times a day (QID) | ORAL | Status: DC | PRN
  Administered 2015-03-06 – 2015-03-09 (×5): 10 mg via ORAL
  Filled 2015-03-06 (×6): qty 2

## 2015-03-06 MED ORDER — ACETAMINOPHEN 650 MG RE SUPP: 325.0000 mg | RECTAL | Status: DC | PRN

## 2015-03-06 MED ORDER — HEPARIN SODIUM (PORCINE) 1000 UNIT/ML IJ SOLN
INTRAMUSCULAR | Status: AC
  Filled 2015-03-06: qty 1

## 2015-03-06 MED ORDER — ALUM & MAG HYDROXIDE-SIMETH 200-200-20 MG/5ML PO SUSP: 15.0000 mL | ORAL | Status: DC | PRN

## 2015-03-06 MED ORDER — HEPARIN SODIUM (PORCINE) 1000 UNIT/ML IJ SOLN
INTRAMUSCULAR | Status: DC | PRN
  Administered 2015-03-06: 6000 [IU] via INTRAVENOUS
  Administered 2015-03-06: 2000 [IU] via INTRAVENOUS

## 2015-03-06 MED ORDER — DOCUSATE SODIUM 100 MG PO CAPS
100.0000 mg | ORAL_CAPSULE | Freq: Every day | ORAL | Status: DC
  Administered 2015-03-07 – 2015-03-09 (×3): 100 mg via ORAL
  Filled 2015-03-06 (×3): qty 1

## 2015-03-06 MED ORDER — SENNOSIDES-DOCUSATE SODIUM 8.6-50 MG PO TABS
1.0000 | ORAL_TABLET | Freq: Every evening | ORAL | Status: DC | PRN
  Filled 2015-03-06: qty 1

## 2015-03-06 MED ORDER — PROPOFOL 10 MG/ML IV BOLUS
INTRAVENOUS | Status: DC | PRN
  Administered 2015-03-06: 200 mg via INTRAVENOUS

## 2015-03-06 MED ORDER — ONDANSETRON HCL 4 MG/2ML IJ SOLN
INTRAMUSCULAR | Status: AC
  Filled 2015-03-06: qty 2

## 2015-03-06 MED ORDER — MIDAZOLAM HCL 2 MG/2ML IJ SOLN
INTRAMUSCULAR | Status: AC
  Filled 2015-03-06: qty 2

## 2015-03-06 MED ORDER — DEXTROSE 5 % IV SOLN
1.5000 g | INTRAVENOUS | Status: DC
  Filled 2015-03-06: qty 1.5

## 2015-03-06 MED ORDER — LIDOCAINE HCL (CARDIAC) 20 MG/ML IV SOLN
INTRAVENOUS | Status: DC | PRN
  Administered 2015-03-06: 50 mg via INTRAVENOUS

## 2015-03-06 MED ORDER — CHLORHEXIDINE GLUCONATE CLOTH 2 % EX PADS: 6.0000 | MEDICATED_PAD | Freq: Once | CUTANEOUS | Status: DC

## 2015-03-06 MED ORDER — FENTANYL CITRATE (PF) 100 MCG/2ML IJ SOLN
INTRAMUSCULAR | Status: DC | PRN
  Administered 2015-03-06 (×2): 50 ug via INTRAVENOUS
  Administered 2015-03-06: 100 ug via INTRAVENOUS
  Administered 2015-03-06: 50 ug via INTRAVENOUS
  Administered 2015-03-06: 100 ug via INTRAVENOUS
  Administered 2015-03-06: 50 ug via INTRAVENOUS

## 2015-03-06 MED ORDER — PHENOL 1.4 % MT LIQD: 1.0000 | OROMUCOSAL | Status: DC | PRN

## 2015-03-06 MED ORDER — ONDANSETRON HCL 4 MG/2ML IJ SOLN: 4.0000 mg | Freq: Four times a day (QID) | INTRAMUSCULAR | Status: DC | PRN

## 2015-03-06 MED ORDER — CLONAZEPAM 0.5 MG PO TABS
0.5000 mg | ORAL_TABLET | Freq: Three times a day (TID) | ORAL | Status: DC | PRN
  Administered 2015-03-07: 0.5 mg via ORAL
  Filled 2015-03-06: qty 1

## 2015-03-06 MED ORDER — POTASSIUM CHLORIDE CRYS ER 20 MEQ PO TBCR: 20.0000 meq | EXTENDED_RELEASE_TABLET | Freq: Every day | ORAL | Status: DC | PRN

## 2015-03-06 MED ORDER — HYDROMORPHONE HCL 1 MG/ML IJ SOLN
0.2500 mg | INTRAMUSCULAR | Status: DC | PRN
  Administered 2015-03-06 (×2): 0.5 mg via INTRAVENOUS

## 2015-03-06 MED ORDER — MAGNESIUM OXIDE 400 MG PO TABS
400.0000 mg | ORAL_TABLET | Freq: Every day | ORAL | Status: DC
  Administered 2015-03-06 – 2015-03-09 (×4): 400 mg via ORAL
  Filled 2015-03-06 (×4): qty 1

## 2015-03-06 MED ORDER — THROMBIN 20000 UNITS EX SOLR
CUTANEOUS | Status: AC
  Filled 2015-03-06: qty 20000

## 2015-03-06 MED ORDER — ACETAMINOPHEN 325 MG PO TABS
325.0000 mg | ORAL_TABLET | ORAL | Status: DC | PRN
  Administered 2015-03-07: 650 mg via ORAL
  Filled 2015-03-06: qty 2

## 2015-03-06 MED ORDER — SODIUM CHLORIDE 0.9 % IV SOLN
INTRAVENOUS | Status: DC
  Administered 2015-03-06 – 2015-03-07 (×2): via INTRAVENOUS

## 2015-03-06 MED ORDER — PAPAVERINE HCL 30 MG/ML IJ SOLN
INTRAMUSCULAR | Status: AC
  Filled 2015-03-06: qty 2

## 2015-03-06 MED ORDER — ROCURONIUM BROMIDE 100 MG/10ML IV SOLN
INTRAVENOUS | Status: DC | PRN
  Administered 2015-03-06 (×2): 10 mg via INTRAVENOUS
  Administered 2015-03-06: 20 mg via INTRAVENOUS
  Administered 2015-03-06: 50 mg via INTRAVENOUS

## 2015-03-06 MED ORDER — LIDOCAINE HCL (CARDIAC) 20 MG/ML IV SOLN
INTRAVENOUS | Status: AC
  Filled 2015-03-06: qty 5

## 2015-03-06 MED ORDER — MIDAZOLAM HCL 5 MG/5ML IJ SOLN
INTRAMUSCULAR | Status: DC | PRN
  Administered 2015-03-06: 2 mg via INTRAVENOUS

## 2015-03-06 MED ORDER — HYDROMORPHONE HCL 1 MG/ML IJ SOLN
0.5000 mg | INTRAMUSCULAR | Status: AC | PRN
  Administered 2015-03-06 (×4): 0.5 mg via INTRAVENOUS

## 2015-03-06 MED ORDER — LABETALOL HCL 5 MG/ML IV SOLN
10.0000 mg | INTRAVENOUS | Status: DC | PRN
  Administered 2015-03-07 (×2): 10 mg via INTRAVENOUS
  Filled 2015-03-06 (×3): qty 4

## 2015-03-06 MED ORDER — BISACODYL 10 MG RE SUPP: 10.0000 mg | Freq: Every day | RECTAL | Status: DC | PRN

## 2015-03-06 MED ORDER — THROMBIN 20000 UNITS EX SOLR
CUTANEOUS | Status: DC | PRN
  Administered 2015-03-06: 20 mL via TOPICAL

## 2015-03-06 MED ORDER — ONDANSETRON HCL 4 MG/2ML IJ SOLN: 4.0000 mg | Freq: Once | INTRAMUSCULAR | Status: DC | PRN

## 2015-03-06 MED ORDER — LACTATED RINGERS IV SOLN
INTRAVENOUS | Status: DC | PRN
  Administered 2015-03-06 (×3): via INTRAVENOUS

## 2015-03-06 MED ORDER — NEOSTIGMINE METHYLSULFATE 10 MG/10ML IV SOLN
INTRAVENOUS | Status: DC | PRN
  Administered 2015-03-06: 3 mg via INTRAVENOUS

## 2015-03-06 MED ORDER — GUAIFENESIN-DM 100-10 MG/5ML PO SYRP: 15.0000 mL | ORAL_SOLUTION | ORAL | Status: DC | PRN

## 2015-03-06 MED ORDER — GLYCOPYRROLATE 0.2 MG/ML IJ SOLN
INTRAMUSCULAR | Status: DC | PRN
  Administered 2015-03-06: 0.4 mg via INTRAVENOUS

## 2015-03-06 MED ORDER — PANTOPRAZOLE SODIUM 40 MG PO TBEC
40.0000 mg | DELAYED_RELEASE_TABLET | Freq: Every day | ORAL | Status: DC
  Administered 2015-03-06 – 2015-03-09 (×4): 40 mg via ORAL
  Filled 2015-03-06 (×5): qty 1

## 2015-03-06 MED ORDER — PAPAVERINE HCL 30 MG/ML IJ SOLN
INTRAMUSCULAR | Status: DC | PRN
  Administered 2015-03-06: 60 mg via INTRAVENOUS

## 2015-03-06 MED ORDER — DEXTROSE 5 % IV SOLN
1.5000 g | Freq: Two times a day (BID) | INTRAVENOUS | Status: AC
  Administered 2015-03-06 – 2015-03-07 (×2): 1.5 g via INTRAVENOUS
  Filled 2015-03-06 (×2): qty 1.5

## 2015-03-06 MED ORDER — 0.9 % SODIUM CHLORIDE (POUR BTL) OPTIME
TOPICAL | Status: DC | PRN
  Administered 2015-03-06: 2000 mL

## 2015-03-06 SURGICAL SUPPLY — 76 items
BANDAGE ELASTIC 4 VELCRO ST LF (GAUZE/BANDAGES/DRESSINGS) ×3 IMPLANT
BANDAGE ESMARK 6X9 LF (GAUZE/BANDAGES/DRESSINGS) IMPLANT
BNDG ESMARK 6X9 LF (GAUZE/BANDAGES/DRESSINGS)
BNDG GAUZE ELAST 4 BULKY (GAUZE/BANDAGES/DRESSINGS) ×3 IMPLANT
CANISTER SUCTION 2500CC (MISCELLANEOUS) ×3 IMPLANT
CLIP TI MEDIUM 24 (CLIP) ×3 IMPLANT
CLIP TI WIDE RED SMALL 24 (CLIP) ×3 IMPLANT
CONT SPEC 4OZ CLIKSEAL STRL BL (MISCELLANEOUS) ×3 IMPLANT
COVER PROBE W GEL 5X96 (DRAPES) ×3 IMPLANT
CUFF TOURNIQUET SINGLE 24IN (TOURNIQUET CUFF) IMPLANT
CUFF TOURNIQUET SINGLE 34IN LL (TOURNIQUET CUFF) IMPLANT
CUFF TOURNIQUET SINGLE 44IN (TOURNIQUET CUFF) IMPLANT
DRAIN CHANNEL 15F RND FF W/TCR (WOUND CARE) IMPLANT
DRAIN WOUND SNY 15 RND (WOUND CARE) ×3 IMPLANT
DRAPE C-ARM 42X72 X-RAY (DRAPES) ×3 IMPLANT
DRAPE PROXIMA HALF (DRAPES) ×3 IMPLANT
DRSG COVADERM 4X10 (GAUZE/BANDAGES/DRESSINGS) ×3 IMPLANT
DRSG COVADERM 4X14 (GAUZE/BANDAGES/DRESSINGS) ×3 IMPLANT
DRSG COVADERM 4X8 (GAUZE/BANDAGES/DRESSINGS) ×3 IMPLANT
ELECT REM PT RETURN 9FT ADLT (ELECTROSURGICAL) ×3
ELECTRODE REM PT RTRN 9FT ADLT (ELECTROSURGICAL) ×1 IMPLANT
EVACUATOR SILICONE 100CC (DRAIN) ×3 IMPLANT
GAUZE SPONGE 4X4 16PLY XRAY LF (GAUZE/BANDAGES/DRESSINGS) ×6 IMPLANT
GLOVE BIO SURGEON STRL SZ 6.5 (GLOVE) ×4 IMPLANT
GLOVE BIO SURGEON STRL SZ7 (GLOVE) ×6 IMPLANT
GLOVE BIO SURGEONS STRL SZ 6.5 (GLOVE) ×2
GLOVE BIOGEL PI IND STRL 6.5 (GLOVE) ×2 IMPLANT
GLOVE BIOGEL PI IND STRL 7.0 (GLOVE) ×2 IMPLANT
GLOVE BIOGEL PI IND STRL 7.5 (GLOVE) ×3 IMPLANT
GLOVE BIOGEL PI INDICATOR 6.5 (GLOVE) ×4
GLOVE BIOGEL PI INDICATOR 7.0 (GLOVE) ×4
GLOVE BIOGEL PI INDICATOR 7.5 (GLOVE) ×6
GLOVE ECLIPSE 6.5 STRL STRAW (GLOVE) ×3 IMPLANT
GLOVE SURG SS PI 6.5 STRL IVOR (GLOVE) ×3 IMPLANT
GOWN STRL REUS W/ TWL LRG LVL3 (GOWN DISPOSABLE) ×4 IMPLANT
GOWN STRL REUS W/TWL LRG LVL3 (GOWN DISPOSABLE) ×8
INSERT FOGARTY SM (MISCELLANEOUS) IMPLANT
KIT BASIN OR (CUSTOM PROCEDURE TRAY) ×3 IMPLANT
KIT ROOM TURNOVER OR (KITS) ×3 IMPLANT
LIQUID BAND (GAUZE/BANDAGES/DRESSINGS) ×3 IMPLANT
LOOP VESSEL MAXI BLUE (MISCELLANEOUS) ×3 IMPLANT
LOOP VESSEL MINI RED (MISCELLANEOUS) ×3 IMPLANT
MARKER GRAFT CORONARY BYPASS (MISCELLANEOUS) IMPLANT
NEEDLE 18GX1X1/2 (RX/OR ONLY) (NEEDLE) ×3 IMPLANT
NS IRRIG 1000ML POUR BTL (IV SOLUTION) ×6 IMPLANT
PACK PERIPHERAL VASCULAR (CUSTOM PROCEDURE TRAY) ×3 IMPLANT
PAD ARMBOARD 7.5X6 YLW CONV (MISCELLANEOUS) ×6 IMPLANT
PADDING CAST COTTON 6X4 STRL (CAST SUPPLIES) IMPLANT
SET MICROPUNCTURE 5F STIFF (MISCELLANEOUS) IMPLANT
SPONGE GAUZE 4X4 12PLY STER LF (GAUZE/BANDAGES/DRESSINGS) ×3 IMPLANT
SPONGE LAP 18X18 X RAY DECT (DISPOSABLE) ×3 IMPLANT
SPONGE SURGIFOAM ABS GEL 100 (HEMOSTASIS) ×3 IMPLANT
STAPLER VISISTAT 35W (STAPLE) ×9 IMPLANT
STOPCOCK 4 WAY LG BORE MALE ST (IV SETS) IMPLANT
SUT ETHILON 3 0 PS 1 (SUTURE) ×3 IMPLANT
SUT GORETEX 5 0 TT13 24 (SUTURE) IMPLANT
SUT GORETEX 6.0 TT13 (SUTURE) IMPLANT
SUT MNCRL AB 4-0 PS2 18 (SUTURE) ×9 IMPLANT
SUT PROLENE 5 0 C 1 24 (SUTURE) ×3 IMPLANT
SUT PROLENE 5 0 C 1 36 (SUTURE) ×3 IMPLANT
SUT PROLENE 6 0 BV (SUTURE) ×3 IMPLANT
SUT PROLENE 7 0 BV 1 (SUTURE) ×21 IMPLANT
SUT SILK 2 0 FS (SUTURE) IMPLANT
SUT SILK 3 0 (SUTURE) ×4
SUT SILK 3-0 18XBRD TIE 12 (SUTURE) ×2 IMPLANT
SUT SILK 4 0 (SUTURE) ×2
SUT SILK 4-0 18XBRD TIE 12 (SUTURE) ×1 IMPLANT
SUT VIC AB 2-0 CT1 27 (SUTURE) ×6
SUT VIC AB 2-0 CT1 TAPERPNT 27 (SUTURE) ×3 IMPLANT
SUT VIC AB 3-0 SH 27 (SUTURE) ×12
SUT VIC AB 3-0 SH 27X BRD (SUTURE) ×6 IMPLANT
SYRINGE 10CC LL (SYRINGE) ×3 IMPLANT
TRAY FOLEY W/METER SILVER 16FR (SET/KITS/TRAYS/PACK) ×3 IMPLANT
TUBING EXTENTION W/L.L. (IV SETS) IMPLANT
UNDERPAD 30X30 INCONTINENT (UNDERPADS AND DIAPERS) ×3 IMPLANT
WATER STERILE IRR 1000ML POUR (IV SOLUTION) ×3 IMPLANT

## 2015-03-06 NOTE — Progress Notes (Signed)
Blood bank called and states last time type and screen was drawn she had an antibody and that it may take longer this time due to that.  Also to let blood bank know if we need anything more than 2 units, CRNA informed.

## 2015-03-06 NOTE — Transfer of Care (Signed)
Immediate Anesthesia Transfer of Care Note  Patient: Briana Mosley  Procedure(s) Performed: Procedure(s): LEFT LEG FEMORAL-BELOW KNEE POPLITEAL ARTERY (Right)  Patient Location: PACU  Anesthesia Type:General  Level of Consciousness: awake, alert  and oriented  Airway & Oxygen Therapy: Patient Spontanous Breathing and Patient connected to nasal cannula oxygen  Post-op Assessment: Report given to RN and Post -op Vital signs reviewed and stable  Post vital signs: Reviewed and stable  Last Vitals:  Filed Vitals:   03/06/15 0606  BP: 198/68  Pulse:   Temp:   Resp:     Complications: No apparent anesthesia complications

## 2015-03-06 NOTE — Interval H&P Note (Signed)
[facility] [place]  History and Physical Update  The patient was interviewed and re-examined.  The patient's previous History and Physical has been reviewed and is unchanged from my consult except for: interval admission for cardiac work-up.  Pt has an intermediate risk nuclear stress test.  She underwent cardiac cath: normal.  There is no change in the plan of care: Right CFA to BK pop bypass with ips GSV.  The risk, benefits, and alternative for bypass operations were discussed with the patient.  The patient is aware the risks include but are not limited to: bleeding, infection, myocardial infarction, stroke, limb loss, nerve damage, need for additional procedures in the future, wound complications, and inability to complete the bypass.  The patient is aware of these risks and agreed to proceed.   Adele Barthel, MD [facility] [place] Office: [phone] Pager: [phone]  03/06/2015, 7:07 AM

## 2015-03-06 NOTE — Anesthesia Procedure Notes (Signed)
Procedure Name: Intubation Performed by: Gean Maidens Pre-anesthesia Checklist: Patient identified, Emergency Drugs available, Suction available, Timeout performed and Patient being monitored Patient Re-evaluated:Patient Re-evaluated prior to inductionOxygen Delivery Method: Circle system utilized Preoxygenation: Pre-oxygenation with 100% oxygen Intubation Type: IV induction Ventilation: Mask ventilation without difficulty Laryngoscope Size: Mac and 3 Grade View: Grade I Tube type: Oral Tube size: 7.0 mm Number of attempts: 1 Airway Equipment and Method: Stylet Placement Confirmation: ETT inserted through vocal cords under direct vision,  positive ETCO2,  CO2 detector and breath sounds checked- equal and bilateral Secured at: 21 cm Tube secured with: Tape Dental Injury: Teeth and Oropharynx as per pre-operative assessment

## 2015-03-06 NOTE — H&P (View-Only) (Signed)
HISTORY AND PHYSICAL     CC:  Non healing wound right leg Referring Provider:  Glenda Chroman., MD  HPI: This is a 49 y.o. female pt of Dr. Bridgett Larsson who is followed for carotid artery stenosis and was last seen in April 2016.  At the time of her visit, she did have a right leg wound that was ~ 47 months old and not healing.  She has not been ambulatory for ~ 4 years and uses a wheelchair.  She presents today for further testing for her right leg wounds.  She is on a beta blocker and ACEI for hypertension.    Past Medical History  Diagnosis Date  . Chronic back pain   . Hypertension   . Thyroid disease     hyperthyroidism  . Lower extremity weakness     Past Surgical History  Procedure Laterality Date  . Back surgery    . Cholecystectomy      Allergies  Allergen Reactions  . Bactrim   . Neurontin [Gabapentin]   . Sulfa Antibiotics   . Tramadol Nausea Only    Current Outpatient Prescriptions  Medication Sig Dispense Refill  . baclofen (LIORESAL) 10 MG tablet Take 10 mg by mouth 4 (four) times daily.    . clonazePAM (KLONOPIN) 0.5 MG tablet Take 0.5 mg by mouth 3 (three) times daily as needed for anxiety.    . Cyanocobalamin (VITAMIN B 12 PO) Take 1 tablet by mouth daily.    . magnesium oxide (MAG-OX) 400 (241.3 MG) MG tablet     . magnesium oxide (MAG-OX) 400 MG tablet Take 400 mg by mouth daily.    . Magnesium Oxide 400 (240 MG) MG TABS     . Oxycodone HCl 10 MG TABS     . potassium chloride (K-DUR,KLOR-CON) 10 MEQ tablet Take 10 mEq by mouth daily.    Marland Kitchen ALPRAZolam (XANAX) 0.5 MG tablet Take 0.5 tablets (0.25 mg total) by mouth 3 (three) times daily as needed for anxiety. (Patient not taking: Reported on 01/27/2015)    . amoxicillin (AMOXIL) 500 MG capsule     . atenolol (TENORMIN) 50 MG tablet Take 50 mg by mouth 2 (two) times daily.    Marland Kitchen lisinopril (PRINIVIL,ZESTRIL) 20 MG tablet Take 20 mg by mouth daily.    Marland Kitchen oxyCODONE-acetaminophen (PERCOCET) 5-325 MG per tablet Take  0.5 tablets by mouth 3 (three) times daily as needed. (Patient not taking: Reported on 02/21/2015) 30 tablet    No current facility-administered medications for this visit.    Family History  Problem Relation Age of Onset  . Cancer Mother   . Heart disease Father   . Diabetes Sister     History   Social History  . Marital Status: Divorced    Spouse Name: N/A  . Number of Children: N/A  . Years of Education: N/A   Occupational History  . Not on file.   Social History Main Topics  . Smoking status: Current Every Day Smoker -- 30 years  . Smokeless tobacco: Never Used  . Alcohol Use: No  . Drug Use: No  . Sexual Activity: Not on file   Other Topics Concern  . Not on file   Social History Narrative     ROS: [x]  Positive   [ ]  Negative   [ ]  All sytems reviewed and are negative  Cardiovascular: []  chest pain/pressure []  palpitations []  SOB lying flat []  DOE []  pain in legs while walking []  pain in feet  when lying flat []  hx of DVT []  hx of phlebitis []  swelling in legs []  varicose veins  Pulmonary: []  productive cough []  asthma []  wheezing  Neurologic: []  weakness in []  arms []  legs []  numbness in []  arms []  legs [] difficulty speaking or slurred speech []  temporary loss of vision in one eye []  dizziness  Hematologic: []  bleeding problems []  problems with blood clotting easily  GI []  vomiting blood []  blood in stool  GU: []  burning with urination []  blood in urine  Psychiatric: []  hx of major depression [x]  anxiety  Integumentary: []  rashes [x]  ulcers  Constitutional: []  fever []  chills   PHYSICAL EXAMINATION:  Filed Vitals:   02/21/15 1522  BP: 154/69  Pulse: 101   Body mass index is 22.28 kg/(m^2).  General:  WDWN in NAD Gait: Not observed-in wheelchair HENT: WNL, normocephalic Pulmonary: normal non-labored breathing , without Rales, rhonchi,  wheezing Skin: without rashes, with nonhealing ulcer to right anterior shin and  lateral ulcer Vascular Exam/Pulses: Pedal pulses are not palpable Extremities: with ischemic changes, without Gangrene , without cellulitis; with open wounds;  Musculoskeletal: no muscle wasting or atrophy  Neurologic: A&O X 3; Appropriate Affect ; SENSATION: normal; MOTOR FUNCTION:  moving all extremities equally. Speech is fluent/normal   Non-Invasive Vascular Imaging:   ABI's 02/21/15: Right:  0.32 Left:  0.66  Arterial duplex right leg 02/21/15: -triphasic CFA & PFA -Right SFA occluded -monophasic popliteal artery (appears small)  Pt meds includes: Statin:  No. Beta Blocker:  No. Aspirin:  No. ACEI:  No. ARB:  No. Other Antiplatelet/Anticoagulant:  No.    ASSESSMENT/PLAN:: 50 y.o. female with critical limb threatening ischemia right leg with non healing wounds   -pt with non healing wounds to right leg and ABI of 0.32.  She will be taken for aortogram with bilateral lower extremity runoff with possible intervention right leg on Monday, Feb 24, 2015 -she understands that this is limb threatening and the need to proceed is urgent -Dr. Bridgett Larsson has discussed the risks of bleeding, pseudoaneurysm, embolization, and damage to the artery to be less than 1%.  She and her family member understand and are in agreement to proceed -he discussed the need to be well hydrated the day before the procedure. -she is not taking the beta blocker or ACEI.  She is not on a statin, but she will need to be started on one after her procedure.  She may also be started on an aspirin and or Plavix.  Leontine Locket, PA-C [facility] [phone]  Clinic MD:  Pt seen and examined in conjunction with Dr. Bridgett Larsson   Addendum  I have independently interviewed and examined the patient, and I agree with the physician assistant's findings.  BLE ABI concerning for CLI in R leg which probably accounts for the poor wound healing in that leg.  Clearly this patient is already at risk of limb loss.   Will arrange for Ao, BRo, possible R leg intervention on Monday, 23 MAY 16.  I discussed with the patient the nature of angiographic procedures, especially the limited patencies of any endovascular intervention.  The patient is aware of that the risks of an angiographic procedure include but are not limited to: bleeding, infection, access site complications, renal failure, embolization, rupture of vessel, dissection, possible need for emergent surgical intervention, possible need for surgical procedures to treat the patient's pathology, anaphylactic reaction to contrast, and stroke and death.  The patient is aware of the risks and  agrees to proceed.   Adele Barthel, MD [facility] [place] Office: [phone] Pager: [phone]  02/21/2015, 4:11 PM

## 2015-03-06 NOTE — Anesthesia Postprocedure Evaluation (Signed)
  Anesthesia Post-op Note  Patient: Briana Mosley  Procedure(s) Performed: Procedure(s): LEFT LEG FEMORAL-BELOW KNEE POPLITEAL ARTERY (Right)  Patient Location: PACU  Anesthesia Type:General  Level of Consciousness: awake, oriented, sedated and patient cooperative  Airway and Oxygen Therapy: Patient Spontanous Breathing  Post-op Pain: mild  Post-op Assessment: Post-op Vital signs reviewed, Patient's Cardiovascular Status Stable, Respiratory Function Stable, Patent Airway, No signs of Nausea or vomiting and Pain level controlled  Post-op Vital Signs: stable  Last Vitals:  Filed Vitals:   03/06/15 1515  BP: 139/63  Pulse: 74  Temp: 36.6 C  Resp: 11    Complications: No apparent anesthesia complications

## 2015-03-06 NOTE — Op Note (Addendum)
OPERATIVE NOTE   PROCEDURE: right common femoral artery to below-the-knee popliteal artery bypass with ipsilateral non-reversed greater saphenous vein   PRE-OPERATIVE DIAGNOSIS: right leg rest pain  POST-OPERATIVE DIAGNOSIS: same as above   SURGEON: Adele Barthel, MD  ASSISTANT(S): Dr. Scot Dock; Leontine Locket, [facility]   ANESTHESIA: general  ESTIMATED BLOOD LOSS: 300 cc  FINDING(S): 1.  Small below-the-knee popliteal artery with minimal disease 2.  Posterior plaque in right common femoral artery  3.  Serous fluid draining from calf consistent with interstitial edema due to chronic venous insufficiency  3.  Dopplerable biphasic posterior tibial artery and dorsalis pedis artery   SPECIMEN(S):  none  INDICATIONS:   Briana Mosley is a 49 y.o. female who presents with rest pain in right leg.  She had angiography which demonstrated chronic superficial femoral artery occlusion.  I recommended right common femoral artery to below-the-knee popliteal bypass with her right greater saphenous vein.  The risk, benefits, and alternative for bypass operations were discussed with the patient.  The patient is aware the risks include but are not limited to: bleeding, infection, myocardial infarction, stroke, limb loss, nerve damage, need for additional procedures in the future, wound complications, and inability to complete the bypass.  The patient is aware of these risks and agreed to proceed.   DESCRIPTION: After full informed written consent was obtained, the patient was brought back to the operating room and placed supine upon the operating table.  Prior to induction, the patient was given intravenous antibiotics.  After obtaining adequate anesthesia, the patient was prepped and draped in the standard fashion for a femoral to popliteal bypass operation.  Attention was turned to the right groin.  A longitudinal incision was made over the right common femoral artery.  Using blunt dissection and  electrocautery, the artery was dissected out from the inguinal ligament down to the femoral bifurcation.  The superficial femoral artery, profunda femoral artery, and external iliac artery were dissected out and vessel loops applied.  Circumflex branches were also dissected and controlled with silk ties.  This common femoral artery was found on exam to be diseased posteriorly but not densely calcified.    At this point, attention was turned to the calf.  An longitudinal incision was made one finger-width posterior to the tibia.  Using blunt dissection and electrocautery, a plane was developed through the subcutaneous tissue and fascia down to the popliteal space.  The medial popliteal vein was dissected out and retracted medially and posteriorly.  This vein appears to atretic and sclerotic.  The below-the-knee popliteal artery was dissected away from lateral popliteal vein.  This popliteal artery was found on exam to be small but not calcified.  At this point, the patient's right greater saphenous vein was identified under Sonosite guidance.  Skip incisions was made over the greater saphenous vein from the saphenofemoral junction down to calf.  The vein conduit was found to be adequate with size: 4-5 mm.  Side branches of greater saphenous vein were tied off with 4-0 silk or clipped with small titanium clips.  The saphenofemoral junction was clamped and the greater saphenous vein was transected.  The saphenofemoral junction was oversewn in a double layer with a running stitch of 5-0 prolene.  The distal extent of this conduit was tied off at the level of mid-calf and the conduit transected proximally.  The harvest vein conduit was soaked in a heparinized saline mixed with papaverine.  A vessel cannula was tied to the distal end  of the vein and the vein was tested by hydrodistension.  Leaks in the conduit were repaired with 4-0 silk ties and 7-0 Prolene stitches.  At the end of this process, I felt the conduit to  be:  Good quality but fragile.  At this point, I bluntly dissected a space between the femoral condyles adjacent to the below-the-knee popliteal vessels.  I bluntly passed the long Gore metal tunneler between the femoral condyles in a subsartorial fashion to the groin incision.  At this point, the patient was given 6000 units of Heparin intravenously, which was a therapeutic bolus.  In total, 8000 units of Heparin was administrated to achieve and maintain a therapeutic level of anticoagulation, giving another 2000 units every subsequent hourly.  After waiting three minutes, the external iliac artery, superficial femoral artery and profunda femoral artery were clamped.  An incision was made in the common femoral artery and extended proximally and distally with a Potts scissor.  The proximal conduit was spatulated to the dimensions of the arteriotomy.  The conduit was sewn to the common femoral artery with a running stitch of 5-0 prolene.  Prior to completing this anastomosis, all vessels were backbled.   No thrombus was noted from any vessels and backbleeding was: limited.  The anastomosis was completed in the usual fashion.  At this point, I passed a valvutome proximally in the conduit and bluntly lysed valves.  There was an excellent pulse throughout this conduit.  I marked the anterior surface.  A few bleeding points required 7-0 Prolene suture repair.  The bullet on the tunneler was removed and then the vein conduit was sewn to the inner cannula with a 2-0 Silk.  I passed the conduit through the metal tunnel, taking care to maintain the orientation of the conduit.    Attention was then turned to the popliteal exposure.   I reset the exposure of the popliteal space.  I verified the popliteal artery was appropriately marked.  I determine the target segment for the anastomosis.  I applied tension to the artery proximally and distally with vessel loops.  I made an incision with a 11-blade in the artery and  extended it proximally and distally with a Potts scissor.  I pulled the conduit to appropriate tension and length, taking into account straightening out the leg.  I adjusted the length of the conduit sharply.  I spatulated this conduit to meet the dimensions of the arteriotomy.  The conduit was sewn to the below-the-knee popliteal artery with a running stitch of 7-0 prolene.  Prior to completing this anastomosis, all vessels were backbled.   No thrombus was noted from any vessels and backbleeding was: limited.  The bypass conduit was allowed to bleed in an antegrade fashion.  The bleeding was: pulsatile.  The anastomosis was completed in the usual fashion.  At this point, all incisions were washed out and thrombin and gelfoam was placed into both incisions.  The continuous doppler exam demonstrated distally: biphasic posterior tibial artery and dorsalis pedis artery.  At this point, bleeding in both incisions were controlled with electrocautery and suture ligature.  After another round of thrombin and gelfoam, no further active bleeding was present.  I washed out both incisions.  The calf was repaired with interrupted deep sutures to reapproximate the deep muscles and a running stitch of 3-0 Vicryl in the subcutaneous tissue.  The subcutaneous tissue was weeping serous fluid, due to her chronic venous insufficiency so I elected to staple the calf incision  to allow the fluid to drain.    Attention was turned to the groin.  The groin was repaired with a double layer of 2-0 Vicryl immediately superficial to the bypass conduit.  The superficial subcutaneous tissue was reapproximated with a double layer of 3-0 Vicryl.  The skin was reapproximated with a running subcuticular of 4-0 Vicryl.  The skin was cleaned, dried, and reinforced with Dermabond.  Due to the depth of the vein harvest, subfascial, I decided to place a hemovac drain at the base of the vein harvest tract from the proximal thigh to the above the knee  exposure.  The subcutaneous tissue was reapproximated in a double layer with 3-0 Vicryl.  The skin was then reapproximated with staples.  The skin was cleaned, dried, and sterile bandages applied to all incisions.    COMPLICATIONS: none  CONDITION: stable   Adele Barthel, MD [facility] [place] Office: [phone] Pager: [phone]  03/06/2015, 1:18 PM

## 2015-03-06 NOTE — Anesthesia Preprocedure Evaluation (Addendum)
Anesthesia Evaluation  Patient identified by MRN, date of birth, ID band Patient awake    Reviewed: Allergy & Precautions, NPO status , Patient's Chart, lab work & pertinent test results  Airway Mallampati: I       Dental   Pulmonary Current Smoker,    Pulmonary exam normal       Cardiovascular hypertension, + Peripheral Vascular Disease Normal cardiovascular exam    Neuro/Psych Anxiety    GI/Hepatic GERD-  ,  Endo/Other    Renal/GU      Musculoskeletal   Abdominal   Peds  Hematology   Anesthesia Other Findings   Reproductive/Obstetrics                            Anesthesia Physical Anesthesia Plan  ASA: III  Anesthesia Plan: General   Post-op Pain Management:    Induction: Intravenous  Airway Management Planned: Oral ETT  Additional Equipment:   Intra-op Plan:   Post-operative Plan: Extubation in OR  Informed Consent: I have reviewed the patients History and Physical, chart, labs and discussed the procedure including the risks, benefits and alternatives for the proposed anesthesia with the patient or authorized representative who has indicated his/her understanding and acceptance.     Plan Discussed with: CRNA, Anesthesiologist and Surgeon  Anesthesia Plan Comments:         Anesthesia Quick Evaluation

## 2015-03-06 NOTE — Progress Notes (Signed)
  [facility] Day of Surgery Note  Subjective:  Having back pain. Otherwise no complaints.   Filed Vitals:   03/06/15 1551  BP: 161/71  Pulse: 100  Temp: 97.5 F (36.4 C)  Resp: 17    Incisions:  Right leg incisions dressings clean. Minimal sanguinous output from JP drain.  Extremities:  Brisk DP doppler signals bilaterally  Assessment/Plan:  This is a 49 y.o. female who is s/p right common femoral artery to below-the-knee popliteal artery bypass with ipsilateral non-reversed greater saphenous vein   Stable post-operatively.  Mobilize in am.   Virgina Jock, PA-C Pager: [phone] 03/06/2015 3:58 PM

## 2015-03-07 ENCOUNTER — Encounter (HOSPITAL_COMMUNITY): Payer: Self-pay | Admitting: Vascular Surgery

## 2015-03-07 LAB — BASIC METABOLIC PANEL
ANION GAP: 10 (ref 5–15)
BUN: 7 mg/dL (ref 6–20)
CHLORIDE: 105 mmol/L (ref 101–111)
CO2: 23 mmol/L (ref 22–32)
CREATININE: 0.75 mg/dL (ref 0.44–1.00)
Calcium: 8.3 mg/dL — ABNORMAL LOW (ref 8.9–10.3)
GFR calc Af Amer: >60 mL/min (ref >60)
Glucose, Bld: 111 mg/dL — ABNORMAL HIGH (ref 65–99)
POTASSIUM: 4.3 mmol/L (ref 3.5–5.1)
Sodium: 138 mmol/L (ref 135–145)

## 2015-03-07 LAB — CBC
HCT: 32 % — ABNORMAL LOW (ref 36.0–46.0)
HEMOGLOBIN: 10.6 g/dL — AB (ref 12.0–15.0)
MCH: 28.8 pg (ref 26.0–34.0)
MCHC: 33.1 g/dL (ref 30.0–36.0)
MCV: 87 fL (ref 78.0–100.0)
PLATELETS: 194 10*3/uL (ref 150–400)
RBC: 3.68 MIL/uL — AB (ref 3.87–5.11)
RDW: 14 % (ref 11.5–15.5)
WBC: 11 10*3/uL — ABNORMAL HIGH (ref 4.0–10.5)

## 2015-03-07 MED ORDER — ENOXAPARIN SODIUM 40 MG/0.4ML ~~LOC~~ SOLN
40.0000 mg | SUBCUTANEOUS | Status: DC
  Administered 2015-03-07 – 2015-03-08 (×2): 40 mg via SUBCUTANEOUS
  Filled 2015-03-07 (×3): qty 0.4

## 2015-03-07 NOTE — Progress Notes (Addendum)
  [facility] Progress Note  03/07/2015 7:52 AM 1 Day Post-Op  Subjective:  Cannot get comfortable. Says her back is bothering her. Feels weak. Denies chest pressure, CP, SOB.   Filed Vitals:   03/07/15 0435  BP: 150/68  Pulse: 107  Temp: 98.2 F (36.8 C)  Resp: 15    Physical Exam: Incisions:  Right leg incisions dressed. Minimal drainage on dressings.  Extremities:  Brisk DP and PT doppler signal bilaterally. Right anterior shin ulcer healing well. Right toe with dry skin at tip. No gangrenous changes.   CBC    Component Value Date/Time   WBC 11.0* 03/07/2015 0304   RBC 3.68* 03/07/2015 0304   HGB 10.6* 03/07/2015 0304   HCT 32.0* 03/07/2015 0304   PLT 194 03/07/2015 0304   MCV 87.0 03/07/2015 0304   MCH 28.8 03/07/2015 0304   MCHC 33.1 03/07/2015 0304   RDW 14.0 03/07/2015 0304   LYMPHSABS 1.7 11/29/2011 1001   MONOABS 0.5 11/29/2011 1001   EOSABS 0.1 11/29/2011 1001   BASOSABS 0.0 11/29/2011 1001    BMET    Component Value Date/Time   NA 138 03/07/2015 0304   K 4.3 03/07/2015 0304   CL 105 03/07/2015 0304   CO2 23 03/07/2015 0304   GLUCOSE 111* 03/07/2015 0304   BUN 7 03/07/2015 0304   CREATININE 0.75 03/07/2015 0304   CALCIUM 8.3* 03/07/2015 0304   GFRNONAA >60 03/07/2015 0304   GFRAA >60 03/07/2015 0304    INR    Component Value Date/Time   INR 1.13 02/28/2015 0306     Intake/Output Summary (Last 24 hours) at 03/07/15 0752 Last data filed at 03/07/15 0439  Gross per 24 hour  Intake   3000 ml  Output   1978 ml  Net   1022 ml     Assessment:  49 y.o. female is s/p: right common femoral artery to below-the-knee popliteal artery bypass with ipsilateral non-reversed greater saphenous vein  1 Day Post-Op  Plan: -Good doppler flow bilaterally.  -Minimal output from JP drain. Discontinue today. -Encourage mobilization. OOB to chair.  -Wet to dry dressings bid to right leg wound.  -Tachycardic likely secondary to pain.    -Post-op Hgb stable. -Transfer to 2W.  -DVT prophylaxis: lovenox    Virgina Jock, PA-C [facility] Office: [phone] Pager: [phone] 03/07/2015 7:52 AM     Agree with above.  Doppler signals in foot. No hematoma foot warm Will take down dressings tomorrow Ambulate Foley out tomorrow  Ruta Hinds, MD [facility] [place] Office: [phone] Pager: [phone]

## 2015-03-07 NOTE — Evaluation (Signed)
Occupational Therapy Evaluation Patient Details Name: Briana Mosley MRN: [id number] DOB: 1966-08-29 Today's Date: 03/07/2015    History of Present Illness This is a 49 y.o. female pt of Dr. Bridgett Larsson who is followed for carotid artery stenosis and was last seen in April 2016. At the time of her visit, she did have a right leg wound that was ~ 46 months old and not healing. She has not been ambulatory for ~ 4 years and uses a wheelchair. She presents today for further testing for her right leg wounds s/p right common femoral artery to below-the-knee popliteal artery bypass with ipsilateral non-reversed greater saphenous vein    Clinical Impression   Pt admitted with above. She demonstrates the below listed deficits and will benefit from continued OT to maximize safety and independence with BADLs.  Pt presents to OT with generalized weakness, impaired balance, and pain.  She currently requires mod - max A for LB ADLs, and min A - mod A +2 for safety for functional mobility.  Pt has all DME, and is able to return home at w/c level until she is safe to ambulate mod I.  Recommend HHOT      Follow Up Recommendations  Home health OT;Supervision/Assistance - 24 hour    Equipment Recommendations  None recommended by OT    Recommendations for Other Services       Precautions / Restrictions Precautions Precautions: Fall      Mobility Bed Mobility Overal bed mobility: Needs Assistance Bed Mobility: Supine to Sit     Supine to sit: Min guard     General bed mobility comments: slow and stuggle, but no assist needed  Transfers Overall transfer level: Needs assistance Equipment used: Rolling walker (2 wheeled) Transfers: Sit to/from [facility] Sit to Stand: Min assist;Mod assist (mod from lower surface.) Stand pivot transfers: Min assist;Mod assist;+2 safety/equipment       General transfer comment: cues for hand placement; assist to come forward more than to power  up.    Balance Overall balance assessment: Needs assistance   Sitting balance-Leahy Scale: Fair     Standing balance support: Bilateral upper extremity supported Standing balance-Leahy Scale: Poor                              ADL Overall ADL's : Needs assistance/impaired Eating/Feeding: Independent;Sitting   Grooming: Wash/dry hands;Wash/dry face;Oral care;Brushing hair;Set up;Sitting   Upper Body Bathing: Set up;Sitting   Lower Body Bathing: Moderate assistance;Sit to/from stand   Upper Body Dressing : Set up;Sitting   Lower Body Dressing: Maximal assistance;Sit to/from stand Lower Body Dressing Details (indicate cue type and reason): Pt able to cross ankles over knees with effort, but unable to access feet to don/doff socks  Toilet Transfer: Moderate assistance;+2 for safety/equipment   Toileting- Clothing Manipulation and Hygiene: Maximal assistance;Sit to/from stand       Functional mobility during ADLs: Minimal assistance;Moderate assistance;+2 for physical assistance General ADL Comments: Pt is very anxious with functional mobility. - afraid of falling      Vision     Perception     Praxis      Pertinent Vitals/Pain Pain Assessment: 0-10 Pain Score: 6  Pain Descriptors / Indicators: Burning;Constant Pain Intervention(s): Monitored during session     Hand Dominance Right   Extremity/Trunk Assessment Upper Extremity Assessment Upper Extremity Assessment: Generalized weakness   Lower Extremity Assessment Lower Extremity Assessment: Defer to PT evaluation RLE Deficits /  Details: hip flexors 3-/5, quads >3/5, hams 3/5, df 3/5 RLE Sensation:  (likely peripheral neuropathy based on pt's description) RLE Coordination: decreased fine motor LLE Deficits / Details: quads 3+/5 . hams 3/5, hip flexors 3-/5 LLE Sensation:  (likely peripheral neuropathy) LLE Coordination: decreased fine motor   Cervical / Trunk Assessment Cervical / Trunk  Assessment: Normal   Communication Communication Communication: No difficulties   Cognition Arousal/Alertness: Awake/alert Behavior During Therapy: Anxious Overall Cognitive Status: Within Functional Limits for tasks assessed                     General Comments       Exercises Exercises:  (hip/knee flexion/ext warm up ROM prior to mobility)     Shoulder Instructions      Home Living Family/patient expects to be discharged to:: Private residence Living Arrangements: Children (son) Available Help at Discharge: Family;Friend(s) (son days--he works nights) Type of Home: Mobile home Home Access: Stairs to enter Technical brewer of Steps: 2   Home Layout: One level     Bathroom Shower/Tub: Tub/shower unit;Curtain Shower/tub characteristics: Architectural technologist: Standard     Home Equipment: Tub bench;Walker - 2 wheels;Bedside commode;Wheelchair - manual          Prior Functioning/Environment Level of Independence: Independent with assistive device(s)        Comments: has grab bars/handles placed to help with transfer to toilet    OT Diagnosis: Generalized weakness;Acute pain   OT Problem List: Decreased activity tolerance;Impaired balance (sitting and/or standing);Decreased safety awareness;Decreased knowledge of use of DME or AE;Pain   OT Treatment/Interventions: Self-care/ADL training;DME and/or AE instruction;Therapeutic activities;Patient/family education;Balance training    OT Goals(Current goals can be found in the care plan section) Acute Rehab OT Goals Patient Stated Goal: to get back home ASAP OT Goal Formulation: With patient Time For Goal Achievement: 03/14/15 Potential to Achieve Goals: Good ADL Goals Pt Will Perform Grooming: with min guard assist;standing Pt Will Perform Lower Body Bathing: with min guard assist;sit to/from stand Pt Will Perform Lower Body Dressing: with min guard assist;sit to/from stand Pt Will Transfer to  Toilet: with min guard assist;ambulating;regular height toilet;bedside commode;grab bars Pt Will Perform Toileting - Clothing Manipulation and hygiene: with min guard assist;sit to/from stand Pt Will Perform Tub/Shower Transfer: with min guard assist;ambulating;tub bench;rolling walker  OT Frequency: Min 2X/week   Barriers to D/C:            Co-evaluation PT/OT/SLP Co-Evaluation/Treatment: Yes Reason for Co-Treatment: For patient/therapist safety   OT goals addressed during session: ADL's and self-care      End of Session Equipment Utilized During Treatment: Rolling walker Nurse Communication: Mobility status  Activity Tolerance: Patient limited by pain;Other (comment) (anxieyt/fear of falling ) Patient left: in chair;with call bell/phone within reach   Time: 1140-1152 OT Time Calculation (min): 12 min Charges:  OT General Charges $OT Visit: 1 Procedure OT Evaluation $Initial OT Evaluation Tier I: 1 Procedure G-Codes:    Briana Mosley, Ellard Artis M 03/24/15, 12:19 PM

## 2015-03-07 NOTE — Evaluation (Signed)
Physical Therapy Evaluation Patient Details Name: Briana Mosley MRN: [id number] DOB: [birth year] Today's Date: 03/07/2015   History of Present Illness  This is a 49 y.o. female pt of Dr. Bridgett Larsson who is followed for carotid artery stenosis and was last seen in April 2016. At the time of her visit, she did have a right leg wound that was ~ 77 months old and not healing. She has not been ambulatory for ~ 4 years and uses a wheelchair. She presents today for further testing for her right leg wounds s/p right common femoral artery to below-the-knee popliteal artery bypass with ipsilateral non-reversed greater saphenous vein   Clinical Impression  Pt admitted with/for R leg ischemia/non-healing wounds, s/p fem-pop BPG.  Pt currently limited functionally due to the problems listed below.  (see problems list.)  Pt will benefit from PT to maximize function and safety to be able to get home safely with available assist of family/friends.      Follow Up Recommendations Home health PT;Supervision for mobility/OOB    Equipment Recommendations  None recommended by PT    Recommendations for Other Services       Precautions / Restrictions Precautions Precautions: Fall      Mobility  Bed Mobility Overal bed mobility: Needs Assistance Bed Mobility: Supine to Sit     Supine to sit: Min guard     General bed mobility comments: slow and stuggle, but no assist needed  Transfers Overall transfer level: Needs assistance Equipment used: Rolling walker (2 wheeled) Transfers: Sit to/from Stand Sit to Stand: Min assist;Mod assist (mod from lower surface.)         General transfer comment: cues for hand placement; assist to come forward more than to power up.  Ambulation/Gait Ambulation/Gait assistance: Mod assist Ambulation Distance (Feet): 4 Feet Assistive device: Rolling walker (2 wheeled) Gait Pattern/deviations: Step-through pattern;Step-to pattern Gait velocity: very slow   General  Gait Details: short steps, mildly ataxic, heavy use of UE's through [employer]            Wheelchair Mobility    Modified Rankin (Stroke Patients Only)       Balance Overall balance assessment: Needs assistance   Sitting balance-Leahy Scale: Fair     Standing balance support: Bilateral upper extremity supported Standing balance-Leahy Scale: Poor                               Pertinent Vitals/Pain Pain Assessment: 0-10 Pain Score: 6  Pain Descriptors / Indicators: Burning;Grimacing;Operative site guarding Pain Intervention(s): Monitored during session;Repositioned;Premedicated before session    [place] expects to be discharged to:: Private residence Living Arrangements: Children (son) Available Help at Discharge: Family;Friend(s) (son days--he works nights) Type of Home: Mobile home Home Access: Stairs to enter   Technical brewer of Steps: 2 Home Layout: One level Home Equipment: Tub bench;Walker - 2 wheels;Bedside commode;Wheelchair - manual      Prior Function Level of Independence: Independent with assistive device(s)         Comments: has grab bars/handles placed to help with transfer to toilet     Hand Dominance        Extremity/Trunk Assessment   Upper Extremity Assessment: Defer to OT evaluation           Lower Extremity Assessment: Generalized weakness;RLE deficits/detail;LLE deficits/detail RLE Deficits / Details: hip flexors 3-/5, quads >3/5, hams 3/5, df 3/5 LLE Deficits / Details: quads 3+/5 .  hams 3/5, hip flexors 3-/5     Communication   Communication: No difficulties  Cognition Arousal/Alertness: Awake/alert Behavior During Therapy: Anxious Overall Cognitive Status: Within Functional Limits for tasks assessed                      General Comments General comments (skin integrity, edema, etc.): HR with activity increasing  into 120's to 158 bpm.  Sat's at acceptable levels     Exercises        Assessment/Plan    PT Assessment Patient needs continued PT services  PT Diagnosis Difficulty walking;Acute pain;Generalized weakness   PT Problem List Decreased strength;Decreased activity tolerance;Decreased mobility;Decreased balance;Decreased knowledge of use of DME;Decreased knowledge of precautions;Pain  PT Treatment Interventions DME instruction;Gait training;Functional mobility training;Therapeutic activities;Balance training;Patient/family education;Stair training   PT Goals (Current goals can be found in the Care Plan section) Acute Rehab PT Goals Patient Stated Goal: back home able to do for myself PT Goal Formulation: With patient Time For Goal Achievement: 03/14/15 Potential to Achieve Goals: Good    Frequency Min 3X/week   Barriers to discharge Other (comment) (alone for long periods during days)      Co-evaluation               End of Session   Activity Tolerance: Patient tolerated treatment well;Patient limited by pain Patient left: in chair;with call bell/phone within reach Nurse Communication: Mobility status         Time: 9093-1121 PT Time Calculation (min) (ACUTE ONLY): 23 min   Charges:   PT Evaluation $Initial PT Evaluation Tier I: 1 Procedure     PT G Codes:        Inioluwa Boulay, Tessie Fass 03/07/2015, 12:13 PM

## 2015-03-07 NOTE — Care Management Note (Signed)
Case Management Note  Patient Details  Name: Briana Mosley MRN: [id number] Date of Birth: Nov 10, 1965  Subjective/Objective:            Pt  from home admitted with non healing wound of lower extremitiy, s/p     right common femoral artery to below-the-knee popliteal artery bypass with ipsilateral non-reversed greater saphenous vein .  Action/Plan: Return to home when medically stable.CM to f/u with d/c disposition.  Expected Discharge Date:                  Expected Discharge Plan:  Home/Self Care  In-House Referral:     Discharge planning Services  CM Consult  Post Acute Care Choice:    Choice offered to:     DME Arranged:    DME Agency:     HH Arranged:    HH Agency:     Status of Service:  In process, will continue to follow  Medicare Important Message Given:    Date Medicare IM Given:    Medicare IM give by:    Date Additional Medicare IM Given:    Additional Medicare Important Message give by:     If discussed at [place] of Stay Meetings, dates discussed:    Additional Comments:  Sharin Mons, RN 03/07/2015, 10:31 AM

## 2015-03-07 NOTE — Progress Notes (Signed)
UR COMPLETED  

## 2015-03-08 NOTE — Progress Notes (Signed)
F/C taken out per MD order, pt tolerated well, pt educated to tell staff when she had to urinate. Cato Mulligan RN

## 2015-03-08 NOTE — Progress Notes (Addendum)
  [facility] Progress Note  Subjective  - POD #2  Up in chair, having some back pain. Says her right leg hurts when trying to lift up.   Objective Filed Vitals:   03/08/15 0425  BP: 124/59  Pulse: 102  Temp: 98.9 F (37.2 C)  Resp: 16    Intake/Output Summary (Last 24 hours) at 03/08/15 0856 Last data filed at 03/08/15 0848  Gross per 24 hour  Intake    120 ml  Output   1400 ml  Net  -1280 ml   Right leg staple lines clean and intact. Some right leg postoperative swelling. Right lower leg ulcers healing well. Brisk right DP and PT doppler signals.   Assessment/Planning: 49 y.o. female is s/p: right common femoral artery to below-the-knee popliteal artery bypass with ipsilateral non-reversed greater saphenous vein  2 Days Post-Op   -D/c foley today.  -Mobilize. -Keep legs elevated to help with swelling.  -Right leg ulcer improving. Continue wet to dry dressings bid.  -DVT prophylaxis:  Lovenox -Dispo: home when ambulation improves and pain well controlled. Possibly tomorrow.    Alvia Grove 03/08/2015 8:56 AM --  Laboratory CBC    Component Value Date/Time   WBC 11.0* 03/07/2015 0304   HGB 10.6* 03/07/2015 0304   HCT 32.0* 03/07/2015 0304   PLT 194 03/07/2015 0304    BMET    Component Value Date/Time   NA 138 03/07/2015 0304   K 4.3 03/07/2015 0304   CL 105 03/07/2015 0304   CO2 23 03/07/2015 0304   GLUCOSE 111* 03/07/2015 0304   BUN 7 03/07/2015 0304   CREATININE 0.75 03/07/2015 0304   CALCIUM 8.3* 03/07/2015 0304   GFRNONAA >60 03/07/2015 0304   GFRAA >60 03/07/2015 0304    COAG Lab Results  Component Value Date   INR 1.13 02/28/2015   No results found for: PTT  Antibiotics Anti-infectives    Start     Dose/Rate Route Frequency Ordered Stop   03/06/15 1600  cefUROXime (ZINACEF) 1.5 g in dextrose 5 % 50 mL IVPB     1.5 g 100 mL/hr over 30 Minutes Intravenous Every 12 hours 03/06/15 1552 03/07/15 0430   03/06/15 1200   cefUROXime (ZINACEF) 1.5 g in dextrose 5 % 50 mL IVPB  Status:  Discontinued     1.5 g 100 mL/hr over 30 Minutes Intravenous To Surgery 03/06/15 1146 03/06/15 1544   03/06/15 0645  cefUROXime (ZINACEF) 1.5 g in dextrose 5 % 50 mL IVPB     1.5 g 100 mL/hr over 30 Minutes Intravenous To Surgery 03/05/15 1304 03/06/15 [place], PA-C [facility] Office: [phone] Pager: [phone] 03/08/2015 8:56 AM   Brisk DP PT doppler Incisions healing off IV pain meds Not very mobile preop will need to be at that baseline prior to d/c Foley out today Mobilize  Most likely d/c home tomorrow  Ruta Hinds, MD [facility] [place]: [phone] Pager: [phone]

## 2015-03-09 MED ORDER — SIMVASTATIN 10 MG PO TABS: 10.0000 mg | ORAL_TABLET | Freq: Every day | ORAL | Status: DC

## 2015-03-09 NOTE — Care Management Note (Signed)
Case Management Note  Patient Details  Name: Briana Mosley MRN: [id number] Date of Birth: 06-25-1966  Subjective/Objective:                   Non healing wound right leg Action/Plan: Discharge planning  Expected Discharge Date:  03/09/15           Expected Discharge Plan:  [place]  In-House Referral:     Discharge planning Services  CM Consult  Post Acute Care Choice:  Resumption of Svcs/PTA Provider Choice offered to:     DME Arranged:    DME Agency:     HH Arranged:  RN, PT, OT [place] Agency:  [place]  Status of Service:  Completed, signed off  Medicare Important Message Given:    Date Medicare IM Given:    Medicare IM give by:    Date Additional Medicare IM Given:    Additional Medicare Important Message give by:     If discussed at [place] of Stay Meetings, dates discussed:    Additional Comments: CM spoke to patient who told us that she had a [facility] RN that came for wound changes along with PT Three times a week. Patient was unsure of her [facility] agency. CM called Juliann Pulse, RN with [facility] to verify agency. CM called [facility] rep Tiffany and notified of resumption of [place] services including RN, PT/OT. Patient states that she already has a walker at home. No other Cm needs communicated.  Guido Sander, RN 03/09/2015, 11:12 AM

## 2015-03-09 NOTE — Progress Notes (Addendum)
  [facility] Progress Note  Subjective  - POD #3  Doing ok. Ready to go home.  Objective Filed Vitals:   03/09/15 0502  BP: 135/55  Pulse: 88  Temp: 98.1 F (36.7 C)  Resp: 16    Intake/Output Summary (Last 24 hours) at 03/09/15 0757 Last data filed at 03/08/15 1555  Gross per 24 hour  Intake    240 ml  Output    275 ml  Net    -35 ml    RLE: staple lines c/d/i Bilateral leg swelling.  Brisk DP and PT doppler signals bilaterally   Assessment/Planning: 49 y.o. female is s/p: right common femoral artery to below-the-knee popliteal artery bypass with ipsilateral non-reversed greater saphenous vein  3 Days Post-Op   Good doppler flow bilaterally.  Incisions clean and intact. Mobility: patient at her baseline. She feels ready to go home. Discharge home today with home health PT and OT. F/u in 2 weeks.  No pain Rx given due to "contract" with PCP. Has prescription from last admission on hold at pharmacy.  DVT prophylaxis:  lovenox  Alvia Grove 03/09/2015 7:57 AM --  Laboratory CBC    Component Value Date/Time   WBC 11.0* 03/07/2015 0304   HGB 10.6* 03/07/2015 0304   HCT 32.0* 03/07/2015 0304   PLT 194 03/07/2015 0304    BMET    Component Value Date/Time   NA 138 03/07/2015 0304   K 4.3 03/07/2015 0304   CL 105 03/07/2015 0304   CO2 23 03/07/2015 0304   GLUCOSE 111* 03/07/2015 0304   BUN 7 03/07/2015 0304   CREATININE 0.75 03/07/2015 0304   CALCIUM 8.3* 03/07/2015 0304   GFRNONAA >60 03/07/2015 0304   GFRAA >60 03/07/2015 0304    COAG Lab Results  Component Value Date   INR 1.13 02/28/2015   No results found for: PTT  Antibiotics Anti-infectives    Start     Dose/Rate Route Frequency Ordered Stop   03/06/15 1600  cefUROXime (ZINACEF) 1.5 g in dextrose 5 % 50 mL IVPB     1.5 g 100 mL/hr over 30 Minutes Intravenous Every 12 hours 03/06/15 1552 03/07/15 0430   03/06/15 1200  cefUROXime (ZINACEF) 1.5 g in dextrose 5 % 50 mL  IVPB  Status:  Discontinued     1.5 g 100 mL/hr over 30 Minutes Intravenous To Surgery 03/06/15 1146 03/06/15 1544   03/06/15 0645  cefUROXime (ZINACEF) 1.5 g in dextrose 5 % 50 mL IVPB     1.5 g 100 mL/hr over 30 Minutes Intravenous To Surgery 03/05/15 1304 03/06/15 [place], PA-C [facility] Office: [phone] Pager: [phone] 03/09/2015 7:57 AM     Agree with above Pink warm right foot with doppler signals D/c home  Ruta Hinds, MD [facility] [place] Office: [phone] Pager: [phone]

## 2015-03-10 LAB — TYPE AND SCREEN
ABO/RH(D): A NEG
Antibody Screen: POSITIVE
DAT, IgG: NEGATIVE
UNIT DIVISION: 0
Unit division: 0

## 2015-03-11 ENCOUNTER — Telehealth: Payer: Self-pay | Admitting: Vascular Surgery

## 2015-03-11 NOTE — Telephone Encounter (Signed)
-----   Message from Mena Goes, RN sent at 03/10/2015 10:48 AM EDT ----- Regarding: Schedule   ----- Message -----    From: Alvia Grove, PA-C    Sent: 03/09/2015   8:13 AM      To: Vvs Charge Pool  right common femoral artery to below-the-knee popliteal artery bypass with ipsilateral non-reversed greater saphenous vein 03/06/15  F/u with Dr. Bridgett Larsson in 2 weeks  Thanks Maudie Mercury

## 2015-03-11 NOTE — Telephone Encounter (Signed)
Spoke with pt to schedule, dpm °

## 2015-03-11 NOTE — Discharge Summary (Signed)
[facility] Discharge Summary  Briana Mosley 06-01-1966 49 y.o. female  [id number]  Admission Date: 03/06/2015  Discharge Date: 03/09/2015  Physician: Adele Barthel, MD  Admission Diagnosis: Peripheral vascular disease with right leg ulcer I70.239  HPI:   This is a 49 y.o. female who is followed for carotid artery stenosis and was last seen in April 2016. At the time of her visit, she did have a right leg wound that was ~ 6 months old and not healing. She has not been ambulatory for ~ 4 years and uses a wheelchair. She presents today for further testing for her right leg wounds.  She is on a beta blocker and ACEI for hypertension.   Hospital Course:  The patient was admitted to the hospital and taken to the operating room on 03/06/2015 and underwent:  right common femoral artery to below-the-knee popliteal artery bypass with ipsilateral non-reversed greater saphenous vein   The patient tolerated the procedure well and was transported to the PACU in stable condition.   She had an unremarkable postoperative course. She had brisk right dorsalis pedis doppler signals. Her right anterior shin ulcer was healing well. She had some post-operative swelling in her right leg. Her incisions were clean and intact. She was discharged home on POD 3 in good condition. Home health physical therapy, occupational therapy and nursing (for wound care) were set up.     CBC    Component Value Date/Time   WBC 11.0* 03/07/2015 0304   RBC 3.68* 03/07/2015 0304   HGB 10.6* 03/07/2015 0304   HCT 32.0* 03/07/2015 0304   PLT 194 03/07/2015 0304   MCV 87.0 03/07/2015 0304   MCH 28.8 03/07/2015 0304   MCHC 33.1 03/07/2015 0304   RDW 14.0 03/07/2015 0304   LYMPHSABS 1.7 11/29/2011 1001   MONOABS 0.5 11/29/2011 1001   EOSABS 0.1 11/29/2011 1001   BASOSABS 0.0 11/29/2011 1001    BMET    Component Value Date/Time   NA 138 03/07/2015 0304   K 4.3 03/07/2015 0304   CL 105  03/07/2015 0304   CO2 23 03/07/2015 0304   GLUCOSE 111* 03/07/2015 0304   BUN 7 03/07/2015 0304   CREATININE 0.75 03/07/2015 0304   CALCIUM 8.3* 03/07/2015 0304   GFRNONAA >60 03/07/2015 0304   GFRAA >60 03/07/2015 0304     Discharge Instructions:   The patient is discharged to home with extensive instructions on wound care and progressive ambulation.  They are instructed not to drive or perform any heavy lifting until returning to see the physician in his office.  Discharge Instructions    Call MD for:  redness, tenderness, or signs of infection (pain, swelling, bleeding, redness, odor or green/yellow discharge around incision site)    Complete by:  As directed      Call MD for:  severe or increased pain, loss or decreased feeling  in affected limb(s)    Complete by:  As directed      Call MD for:  temperature >100.5    Complete by:  As directed      Discharge wound care:    Complete by:  As directed   Wash stapled incisions daily with soap and water and pat dry. Gently wash right leg wound with soap and water daily and apply saline gauze daily. Apply dry gauze on top and wrap with dressing to stay put.     Driving Restrictions    Complete by:  As directed   No driving  for 2 weeks     Increase activity slowly    Complete by:  As directed   Walk with assistance use walker or cane as needed     Lifting restrictions    Complete by:  As directed   No lifting for 2 weeks     Resume previous diet    Complete by:  As directed            Discharge Diagnosis:  Peripheral vascular disease with right leg ulcer I70.239  Secondary Diagnosis: Patient Active Problem List   Diagnosis Date Noted  . Non-healing wound of lower extremity 03/06/2015  . Critical lower limb ischemia 02/21/2015  . Carotid artery occlusion 01/27/2015  . Carotid stenosis 01/27/2015  . Chronic venous insufficiency 01/27/2015  . Venous stasis ulcer of ankle 01/27/2015  . Hallucinations 11/29/2011  .  Confusion 11/29/2011  . Hypertension 11/29/2011  . Back pain 11/29/2011   Past Medical History  Diagnosis Date  . Chronic back pain   . Hypertension   . Thyroid disease     hyperthyroidism  . Lower extremity weakness   . Anxiety   . GERD (gastroesophageal reflux disease)     otc med if needed       Medication List    TAKE these medications        ALPRAZolam 0.5 MG tablet  Commonly known as:  XANAX  Take 0.5 tablets (0.25 mg total) by mouth 3 (three) times daily as needed for anxiety.     aspirin EC 81 MG tablet  Take 1 tablet (81 mg total) by mouth daily.     baclofen 10 MG tablet  Commonly known as:  LIORESAL  Take 10 mg by mouth 4 (four) times daily.     clonazePAM 0.5 MG tablet  Commonly known as:  KLONOPIN  Take 0.5 mg by mouth 3 (three) times daily as needed for anxiety.     magnesium oxide 400 MG tablet  Commonly known as:  MAG-OX  Take 400 mg by mouth daily.     Oxycodone HCl 10 MG Tabs  Take 1 tablet (10 mg total) by mouth 4 (four) times daily as needed (pain).     simvastatin 10 MG tablet  Commonly known as:  ZOCOR  Take 1 tablet (10 mg total) by mouth daily.        Oxycodone #30 No Refill  Disposition: Home  Patient's condition: is Good  Follow up: 1. Dr. Bridgett Larsson in 2 weeks   Virgina Jock, PA-C [facility] [phone] 03/11/2015  9:09 AM  Addendum  I have independently interviewed and examined the patient, and I agree with the physician assistant's discharge summary.  This underwent an uneventful Right common femoral artery to right below-the-knee popliteal bypass with non-reversed greater saphenous vein for rest pain and right lateral ulcers that would not heal with conservative measures.  The patient had an uneventful post-operative course except for chronic back pain.  Adele Barthel, MD [facility] [place] Office: [phone] Pager: [phone]  03/24/2015, 7:00 AM   - For VQI Registry  use --- Instructions: Press F2 to tab through selections.  Delete question if not applicable.   Post-op:  Wound infection: No  Graft infection: No  Transfusion: No   New Arrhythmia: No Ipsilateral amputation: No, [ ]  Minor, [ ]  BKA, [ ]  AKA Discharge patency: [x]  Primary, [ ]  Primary assisted, [ ]  Secondary, [ ]  Occluded Patency judged by: [x ] Dopper only, [ ]   Palpable graft pulse, [ ]  Palpable distal pulse, [ ]  ABI inc. > 0.15, [ ]  Duplex D/C Ambulatory Status: Ambulatory with Assistance  Complications: MI: No, [ ]  Troponin only, [ ]  EKG or Clinical CHF: No Resp failure:No, [ ]  Pneumonia, [ ]  Ventilator Chg in renal function: No, [ ]  Inc. Cr > 0.5, [ ]  Temp. Dialysis, [ ]  Permanent dialysis Stroke: No, [ ]  Minor, [ ]  Major Return to OR: No  Reason for return to OR: [ ]  Bleeding, [ ]  Infection, [ ]  Thrombosis, [ ]  Revision  Discharge medications: Statin use:  yes ASA use:  yes Plavix use:  no Beta blocker use: no Coumadin use: no

## 2015-03-16 ENCOUNTER — Encounter (HOSPITAL_COMMUNITY)
Admission: EM | Disposition: A | Discharge status: Home Health | Payer: Self-pay | Source: Ambulatory Visit | Attending: Vascular Surgery

## 2015-03-16 ENCOUNTER — Encounter (HOSPITAL_COMMUNITY): Payer: Self-pay | Admitting: Emergency Medicine

## 2015-03-16 ENCOUNTER — Emergency Department (HOSPITAL_COMMUNITY): Payer: Medicare Other | Admitting: Anesthesiology

## 2015-03-16 ENCOUNTER — Inpatient Hospital Stay (HOSPITAL_COMMUNITY)
Admission: EM | Admit: 2015-03-16 | Discharge: 2015-03-21 | DRG: 253 | Disposition: A | Discharge status: Home Health | Payer: Medicare Other | Source: Ambulatory Visit | Attending: Vascular Surgery | Admitting: Vascular Surgery

## 2015-03-16 ENCOUNTER — Inpatient Hospital Stay (HOSPITAL_COMMUNITY): Payer: Medicare Other

## 2015-03-16 ENCOUNTER — Other Ambulatory Visit (HOSPITAL_COMMUNITY): Payer: Self-pay

## 2015-03-16 DIAGNOSIS — Z79891 Long term (current) use of opiate analgesic: Secondary | ICD-10-CM

## 2015-03-16 DIAGNOSIS — Z881 Allergy status to other antibiotic agents status: Secondary | ICD-10-CM | POA: Diagnosis not present

## 2015-03-16 DIAGNOSIS — B965 Pseudomonas (aeruginosa) (mallei) (pseudomallei) as the cause of diseases classified elsewhere: Secondary | ICD-10-CM | POA: Diagnosis present

## 2015-03-16 DIAGNOSIS — Z79899 Other long term (current) drug therapy: Secondary | ICD-10-CM | POA: Diagnosis not present

## 2015-03-16 DIAGNOSIS — Y832 Surgical operation with anastomosis, bypass or graft as the cause of abnormal reaction of the patient, or of later complication, without mention of misadventure at the time of the procedure: Secondary | ICD-10-CM | POA: Diagnosis present

## 2015-03-16 DIAGNOSIS — N390 Urinary tract infection, site not specified: Secondary | ICD-10-CM | POA: Diagnosis present

## 2015-03-16 DIAGNOSIS — T82898A Other specified complication of vascular prosthetic devices, implants and grafts, initial encounter: Secondary | ICD-10-CM | POA: Diagnosis present

## 2015-03-16 DIAGNOSIS — Z888 Allergy status to other drugs, medicaments and biological substances status: Secondary | ICD-10-CM

## 2015-03-16 DIAGNOSIS — E0590 Thyrotoxicosis, unspecified without thyrotoxic crisis or storm: Secondary | ICD-10-CM | POA: Diagnosis present

## 2015-03-16 DIAGNOSIS — D62 Acute posthemorrhagic anemia: Secondary | ICD-10-CM | POA: Diagnosis present

## 2015-03-16 DIAGNOSIS — Z7982 Long term (current) use of aspirin: Secondary | ICD-10-CM

## 2015-03-16 DIAGNOSIS — Z419 Encounter for procedure for purposes other than remedying health state, unspecified: Secondary | ICD-10-CM

## 2015-03-16 DIAGNOSIS — F419 Anxiety disorder, unspecified: Secondary | ICD-10-CM | POA: Diagnosis present

## 2015-03-16 DIAGNOSIS — I10 Essential (primary) hypertension: Secondary | ICD-10-CM | POA: Diagnosis present

## 2015-03-16 DIAGNOSIS — Z452 Encounter for adjustment and management of vascular access device: Secondary | ICD-10-CM

## 2015-03-16 DIAGNOSIS — T82838A Hemorrhage of vascular prosthetic devices, implants and grafts, initial encounter: Secondary | ICD-10-CM | POA: Diagnosis not present

## 2015-03-16 DIAGNOSIS — I739 Peripheral vascular disease, unspecified: Secondary | ICD-10-CM

## 2015-03-16 DIAGNOSIS — Z9889 Other specified postprocedural states: Secondary | ICD-10-CM | POA: Diagnosis not present

## 2015-03-16 DIAGNOSIS — K219 Gastro-esophageal reflux disease without esophagitis: Secondary | ICD-10-CM | POA: Diagnosis present

## 2015-03-16 DIAGNOSIS — Z418 Encounter for other procedures for purposes other than remedying health state: Secondary | ICD-10-CM

## 2015-03-16 HISTORY — PX: FEMORAL-POPLITEAL BYPASS GRAFT: SHX937

## 2015-03-16 HISTORY — PX: FEMORAL ARTERY EXPLORATION: SHX5160

## 2015-03-16 LAB — POCT I-STAT 4, (NA,K, GLUC, HGB,HCT)
Glucose, Bld: 129 mg/dL — ABNORMAL HIGH (ref 65–99)
HEMATOCRIT: 18 % — AB (ref 36.0–46.0)
Hemoglobin: 6.1 g/dL — CL (ref 12.0–15.0)
Potassium: 3.7 mmol/L (ref 3.5–5.1)
Sodium: 140 mmol/L (ref 135–145)

## 2015-03-16 LAB — CBC WITH DIFFERENTIAL/PLATELET
Basophils Absolute: 0 10*3/uL (ref 0.0–0.1)
Basophils Relative: 0 % (ref 0–1)
EOS ABS: 0.1 10*3/uL (ref 0.0–0.7)
Eosinophils Relative: 1 % (ref 0–5)
HEMATOCRIT: 28.1 % — AB (ref 36.0–46.0)
HEMOGLOBIN: 9.2 g/dL — AB (ref 12.0–15.0)
LYMPHS ABS: 1.5 10*3/uL (ref 0.7–4.0)
LYMPHS PCT: 10 % — AB (ref 12–46)
MCH: 28.7 pg (ref 26.0–34.0)
MCHC: 32.7 g/dL (ref 30.0–36.0)
MCV: 87.5 fL (ref 78.0–100.0)
MONO ABS: 0.7 10*3/uL (ref 0.1–1.0)
MONOS PCT: 5 % (ref 3–12)
Neutro Abs: 12.3 10*3/uL — ABNORMAL HIGH (ref 1.7–7.7)
Neutrophils Relative %: 84 % — ABNORMAL HIGH (ref 43–77)
PLATELETS: 293 10*3/uL (ref 150–400)
RBC: 3.21 MIL/uL — AB (ref 3.87–5.11)
RDW: 14.7 % (ref 11.5–15.5)
WBC: 14.6 10*3/uL — ABNORMAL HIGH (ref 4.0–10.5)

## 2015-03-16 LAB — CBC
HCT: 21 % — ABNORMAL LOW (ref 36.0–46.0)
Hemoglobin: 6.7 g/dL — CL (ref 12.0–15.0)
MCH: 27.8 pg (ref 26.0–34.0)
MCHC: 31.9 g/dL (ref 30.0–36.0)
MCV: 87.1 fL (ref 78.0–100.0)
PLATELETS: 243 10*3/uL (ref 150–400)
RBC: 2.41 MIL/uL — AB (ref 3.87–5.11)
RDW: 14.5 % (ref 11.5–15.5)
WBC: 12.2 10*3/uL — ABNORMAL HIGH (ref 4.0–10.5)

## 2015-03-16 LAB — PREPARE RBC (CROSSMATCH)

## 2015-03-16 LAB — BASIC METABOLIC PANEL
Anion gap: 7 (ref 5–15)
BUN: 6 mg/dL (ref 6–20)
CALCIUM: 7.9 mg/dL — AB (ref 8.9–10.3)
CO2: 26 mmol/L (ref 22–32)
Chloride: 106 mmol/L (ref 101–111)
Creatinine, Ser: 0.72 mg/dL (ref 0.44–1.00)
GFR calc Af Amer: >60 mL/min (ref >60)
GFR calc non Af Amer: >60 mL/min (ref >60)
Glucose, Bld: 124 mg/dL — ABNORMAL HIGH (ref 65–99)
Potassium: 3.8 mmol/L (ref 3.5–5.1)
Sodium: 139 mmol/L (ref 135–145)

## 2015-03-16 SURGERY — BYPASS GRAFT FEMORAL-POPLITEAL ARTERY
Anesthesia: General | Site: Leg Lower | Laterality: Right

## 2015-03-16 MED ORDER — PROPOFOL 10 MG/ML IV BOLUS
INTRAVENOUS | Status: DC | PRN
  Administered 2015-03-16: 90 mg via INTRAVENOUS

## 2015-03-16 MED ORDER — FENTANYL CITRATE (PF) 250 MCG/5ML IJ SOLN
INTRAMUSCULAR | Status: AC
  Filled 2015-03-16: qty 5

## 2015-03-16 MED ORDER — HEPARIN SODIUM (PORCINE) 1000 UNIT/ML IJ SOLN
INTRAMUSCULAR | Status: DC | PRN
  Administered 2015-03-16: 7000 [IU] via INTRAVENOUS

## 2015-03-16 MED ORDER — PROPOFOL 10 MG/ML IV BOLUS
INTRAVENOUS | Status: AC
  Filled 2015-03-16: qty 20

## 2015-03-16 MED ORDER — ROCURONIUM BROMIDE 100 MG/10ML IV SOLN
INTRAVENOUS | Status: DC | PRN
  Administered 2015-03-16: 40 mg via INTRAVENOUS

## 2015-03-16 MED ORDER — BISACODYL 10 MG RE SUPP: 10.0000 mg | Freq: Every day | RECTAL | Status: DC | PRN

## 2015-03-16 MED ORDER — PHENYLEPHRINE HCL 10 MG/ML IJ SOLN
INTRAMUSCULAR | Status: AC
  Filled 2015-03-16: qty 1

## 2015-03-16 MED ORDER — CEFAZOLIN SODIUM-DEXTROSE 2-3 GM-% IV SOLR
INTRAVENOUS | Status: DC | PRN
  Administered 2015-03-16: 2 g via INTRAVENOUS

## 2015-03-16 MED ORDER — SODIUM CHLORIDE 0.9 % IV SOLN
INTRAVENOUS | Status: DC
  Administered 2015-03-16: 19:00:00 via INTRAVENOUS

## 2015-03-16 MED ORDER — LACTATED RINGERS IV SOLN
INTRAVENOUS | Status: DC | PRN
  Administered 2015-03-16: 16:00:00 via INTRAVENOUS

## 2015-03-16 MED ORDER — 0.9 % SODIUM CHLORIDE (POUR BTL) OPTIME
TOPICAL | Status: DC | PRN
  Administered 2015-03-16: 1000 mL

## 2015-03-16 MED ORDER — DEXTROSE 5 % IV SOLN
1.5000 g | INTRAVENOUS | Status: DC
  Filled 2015-03-16: qty 1.5

## 2015-03-16 MED ORDER — MORPHINE SULFATE 2 MG/ML IJ SOLN
2.0000 mg | INTRAMUSCULAR | Status: DC | PRN
  Administered 2015-03-16 – 2015-03-20 (×13): 2 mg via INTRAVENOUS
  Filled 2015-03-16 (×13): qty 1

## 2015-03-16 MED ORDER — OXYCODONE HCL 10 MG PO TABS
10.0000 mg | ORAL_TABLET | Freq: Four times a day (QID) | ORAL | Status: DC | PRN
  Filled 2015-03-16: qty 1

## 2015-03-16 MED ORDER — PHENYLEPHRINE HCL 10 MG/ML IJ SOLN
10.0000 mg | INTRAMUSCULAR | Status: DC | PRN
  Administered 2015-03-16: 10 ug/min via INTRAVENOUS

## 2015-03-16 MED ORDER — STERILE WATER FOR INJECTION IJ SOLN
INTRAMUSCULAR | Status: AC
  Filled 2015-03-16: qty 10

## 2015-03-16 MED ORDER — METOPROLOL TARTRATE 1 MG/ML IV SOLN: 2.0000 mg | INTRAVENOUS | Status: DC | PRN

## 2015-03-16 MED ORDER — CLONAZEPAM 0.5 MG PO TABS
0.5000 mg | ORAL_TABLET | Freq: Three times a day (TID) | ORAL | Status: DC | PRN
  Administered 2015-03-16 – 2015-03-21 (×6): 0.5 mg via ORAL
  Filled 2015-03-16 (×6): qty 1

## 2015-03-16 MED ORDER — ACETAMINOPHEN 325 MG PO TABS
325.0000 mg | ORAL_TABLET | ORAL | Status: DC | PRN
  Administered 2015-03-17: 650 mg via ORAL
  Filled 2015-03-16: qty 2

## 2015-03-16 MED ORDER — ONDANSETRON HCL 4 MG/2ML IJ SOLN: 4.0000 mg | Freq: Four times a day (QID) | INTRAMUSCULAR | Status: DC | PRN

## 2015-03-16 MED ORDER — LIDOCAINE HCL (CARDIAC) 20 MG/ML IV SOLN
INTRAVENOUS | Status: DC | PRN
  Administered 2015-03-16: 50 mg via INTRAVENOUS

## 2015-03-16 MED ORDER — NEOSTIGMINE METHYLSULFATE 10 MG/10ML IV SOLN
INTRAVENOUS | Status: AC
  Filled 2015-03-16: qty 2

## 2015-03-16 MED ORDER — HEPARIN SODIUM (PORCINE) 1000 UNIT/ML IJ SOLN
INTRAMUSCULAR | Status: AC
  Filled 2015-03-16: qty 1

## 2015-03-16 MED ORDER — SODIUM CHLORIDE 0.9 % IV SOLN
10.0000 mL/h | Freq: Once | INTRAVENOUS | Status: AC
  Administered 2015-03-16: 10 mL/h via INTRAVENOUS

## 2015-03-16 MED ORDER — PHENOL 1.4 % MT LIQD: 1.0000 | OROMUCOSAL | Status: DC | PRN

## 2015-03-16 MED ORDER — SENNOSIDES-DOCUSATE SODIUM 8.6-50 MG PO TABS
1.0000 | ORAL_TABLET | Freq: Every evening | ORAL | Status: DC | PRN
  Filled 2015-03-16: qty 1

## 2015-03-16 MED ORDER — SIMVASTATIN 10 MG PO TABS
10.0000 mg | ORAL_TABLET | Freq: Every day | ORAL | Status: DC
  Administered 2015-03-16 – 2015-03-20 (×5): 10 mg via ORAL
  Filled 2015-03-16 (×7): qty 1

## 2015-03-16 MED ORDER — ONDANSETRON HCL 4 MG/2ML IJ SOLN: 4.0000 mg | Freq: Once | INTRAMUSCULAR | Status: DC | PRN

## 2015-03-16 MED ORDER — SODIUM CHLORIDE 0.9 % IV SOLN: 500.0000 mL | Freq: Once | INTRAVENOUS | Status: AC | PRN

## 2015-03-16 MED ORDER — LIDOCAINE HCL (CARDIAC) 20 MG/ML IV SOLN
INTRAVENOUS | Status: AC
  Filled 2015-03-16: qty 15

## 2015-03-16 MED ORDER — HYDRALAZINE HCL 20 MG/ML IJ SOLN: 5.0000 mg | INTRAMUSCULAR | Status: DC | PRN

## 2015-03-16 MED ORDER — PROTAMINE SULFATE 10 MG/ML IV SOLN
INTRAVENOUS | Status: AC
  Filled 2015-03-16: qty 5

## 2015-03-16 MED ORDER — ACETAMINOPHEN 650 MG RE SUPP
325.0000 mg | RECTAL | Status: DC | PRN
Start: 2015-03-16 — End: 2015-03-21

## 2015-03-16 MED ORDER — SUCCINYLCHOLINE CHLORIDE 20 MG/ML IJ SOLN
INTRAMUSCULAR | Status: AC
  Filled 2015-03-16: qty 1

## 2015-03-16 MED ORDER — FENTANYL CITRATE (PF) 100 MCG/2ML IJ SOLN
INTRAMUSCULAR | Status: AC
  Administered 2015-03-16: 50 ug via INTRAVENOUS
  Filled 2015-03-16: qty 2

## 2015-03-16 MED ORDER — ROCURONIUM BROMIDE 50 MG/5ML IV SOLN
INTRAVENOUS | Status: AC
  Filled 2015-03-16: qty 1

## 2015-03-16 MED ORDER — OXYCODONE HCL 5 MG/5ML PO SOLN: 5.0000 mg | Freq: Once | ORAL | Status: DC | PRN

## 2015-03-16 MED ORDER — FENTANYL CITRATE (PF) 100 MCG/2ML IJ SOLN
25.0000 ug | INTRAMUSCULAR | Status: DC | PRN
  Administered 2015-03-16 (×2): 50 ug via INTRAVENOUS

## 2015-03-16 MED ORDER — LACTATED RINGERS IV SOLN
INTRAVENOUS | Status: DC | PRN
  Administered 2015-03-16 (×3): via INTRAVENOUS

## 2015-03-16 MED ORDER — OXYCODONE HCL 5 MG PO TABS
5.0000 mg | ORAL_TABLET | Freq: Once | ORAL | Status: DC | PRN
Start: 2015-03-16 — End: 2015-03-16

## 2015-03-16 MED ORDER — DOCUSATE SODIUM 100 MG PO CAPS
100.0000 mg | ORAL_CAPSULE | Freq: Every day | ORAL | Status: DC
  Administered 2015-03-17 – 2015-03-21 (×5): 100 mg via ORAL
  Filled 2015-03-16 (×5): qty 1

## 2015-03-16 MED ORDER — ASPIRIN EC 81 MG PO TBEC
81.0000 mg | DELAYED_RELEASE_TABLET | Freq: Every day | ORAL | Status: DC
  Administered 2015-03-17 – 2015-03-20 (×4): 81 mg via ORAL
  Filled 2015-03-16 (×7): qty 1

## 2015-03-16 MED ORDER — NEOSTIGMINE METHYLSULFATE 10 MG/10ML IV SOLN
INTRAVENOUS | Status: DC | PRN
  Administered 2015-03-16: 4 mg via INTRAVENOUS

## 2015-03-16 MED ORDER — DEXTROSE 5 % IV SOLN
1.5000 g | Freq: Two times a day (BID) | INTRAVENOUS | Status: AC
  Administered 2015-03-16 – 2015-03-17 (×2): 1.5 g via INTRAVENOUS
  Filled 2015-03-16 (×2): qty 1.5

## 2015-03-16 MED ORDER — CEFAZOLIN SODIUM-DEXTROSE 2-3 GM-% IV SOLR
INTRAVENOUS | Status: AC
  Filled 2015-03-16: qty 50

## 2015-03-16 MED ORDER — MAGNESIUM OXIDE 400 MG PO TABS
400.0000 mg | ORAL_TABLET | Freq: Every day | ORAL | Status: DC
  Administered 2015-03-16 – 2015-03-20 (×5): 400 mg via ORAL
  Filled 2015-03-16 (×7): qty 1

## 2015-03-16 MED ORDER — GLYCOPYRROLATE 0.2 MG/ML IJ SOLN
INTRAMUSCULAR | Status: DC | PRN
  Administered 2015-03-16: 0.6 mg via INTRAVENOUS

## 2015-03-16 MED ORDER — THROMBIN 20000 UNITS EX SOLR
CUTANEOUS | Status: AC
  Filled 2015-03-16: qty 20000

## 2015-03-16 MED ORDER — GLYCOPYRROLATE 0.2 MG/ML IJ SOLN
INTRAMUSCULAR | Status: AC
  Filled 2015-03-16: qty 3

## 2015-03-16 MED ORDER — GUAIFENESIN-DM 100-10 MG/5ML PO SYRP: 15.0000 mL | ORAL_SOLUTION | ORAL | Status: DC | PRN

## 2015-03-16 MED ORDER — ONDANSETRON HCL 4 MG/2ML IJ SOLN
INTRAMUSCULAR | Status: AC
  Filled 2015-03-16: qty 2

## 2015-03-16 MED ORDER — PANTOPRAZOLE SODIUM 40 MG PO TBEC
40.0000 mg | DELAYED_RELEASE_TABLET | Freq: Every day | ORAL | Status: DC
  Administered 2015-03-16 – 2015-03-21 (×6): 40 mg via ORAL
  Filled 2015-03-16 (×6): qty 1

## 2015-03-16 MED ORDER — ARTIFICIAL TEARS OP OINT
TOPICAL_OINTMENT | OPHTHALMIC | Status: AC
  Filled 2015-03-16: qty 3.5

## 2015-03-16 MED ORDER — POTASSIUM CHLORIDE CRYS ER 20 MEQ PO TBCR: 20.0000 meq | EXTENDED_RELEASE_TABLET | Freq: Every day | ORAL | Status: DC | PRN

## 2015-03-16 MED ORDER — OXYCODONE HCL 5 MG PO TABS
10.0000 mg | ORAL_TABLET | Freq: Four times a day (QID) | ORAL | Status: DC | PRN
  Administered 2015-03-16 – 2015-03-21 (×17): 10 mg via ORAL
  Filled 2015-03-16 (×18): qty 2

## 2015-03-16 MED ORDER — FENTANYL CITRATE (PF) 100 MCG/2ML IJ SOLN
INTRAMUSCULAR | Status: DC | PRN
  Administered 2015-03-16: 50 ug via INTRAVENOUS
  Administered 2015-03-16 (×3): 100 ug via INTRAVENOUS
  Administered 2015-03-16 (×3): 50 ug via INTRAVENOUS

## 2015-03-16 MED ORDER — PROTAMINE SULFATE 10 MG/ML IV SOLN
INTRAVENOUS | Status: DC | PRN
  Administered 2015-03-16: 50 mg via INTRAVENOUS

## 2015-03-16 MED ORDER — LABETALOL HCL 5 MG/ML IV SOLN
10.0000 mg | INTRAVENOUS | Status: DC | PRN
  Filled 2015-03-16: qty 4

## 2015-03-16 MED ORDER — SODIUM CHLORIDE 0.9 % IV SOLN: 10.0000 mL/h | Freq: Once | INTRAVENOUS | Status: DC

## 2015-03-16 MED ORDER — BACLOFEN 10 MG PO TABS
10.0000 mg | ORAL_TABLET | Freq: Four times a day (QID) | ORAL | Status: DC
  Administered 2015-03-16 – 2015-03-21 (×18): 10 mg via ORAL
  Filled 2015-03-16 (×23): qty 1

## 2015-03-16 MED ORDER — EPHEDRINE SULFATE 50 MG/ML IJ SOLN
INTRAMUSCULAR | Status: AC
  Filled 2015-03-16: qty 1

## 2015-03-16 MED ORDER — SODIUM CHLORIDE 0.9 % IR SOLN
Status: DC | PRN
  Administered 2015-03-16: 500 mL

## 2015-03-16 MED ORDER — ONDANSETRON HCL 4 MG/2ML IJ SOLN
INTRAMUSCULAR | Status: DC | PRN
  Administered 2015-03-16: 4 mg via INTRAVENOUS

## 2015-03-16 SURGICAL SUPPLY — 55 items
BANDAGE ESMARK 6X9 LF (GAUZE/BANDAGES/DRESSINGS) IMPLANT
BENZOIN TINCTURE PRP APPL 2/3 (GAUZE/BANDAGES/DRESSINGS) ×4 IMPLANT
BNDG ESMARK 6X9 LF (GAUZE/BANDAGES/DRESSINGS)
CANISTER SUCTION 2500CC (MISCELLANEOUS) ×4 IMPLANT
CANNULA VESSEL 3MM 2 BLNT TIP (CANNULA) ×8 IMPLANT
CLIP LIGATING EXTRA MED SLVR (CLIP) ×4 IMPLANT
CLIP LIGATING EXTRA SM BLUE (MISCELLANEOUS) ×4 IMPLANT
CLOSURE WOUND 1/2 X4 (GAUZE/BANDAGES/DRESSINGS) ×1
CUFF TOURNIQUET SINGLE 24IN (TOURNIQUET CUFF) ×4 IMPLANT
CUFF TOURNIQUET SINGLE 34IN LL (TOURNIQUET CUFF) IMPLANT
CUFF TOURNIQUET SINGLE 44IN (TOURNIQUET CUFF) IMPLANT
DRAIN SNY 10X20 3/4 PERF (WOUND CARE) IMPLANT
DRAPE X-RAY CASS 24X20 (DRAPES) IMPLANT
DRSG COVADERM 4X10 (GAUZE/BANDAGES/DRESSINGS) IMPLANT
DRSG COVADERM 4X8 (GAUZE/BANDAGES/DRESSINGS) IMPLANT
ELECT REM PT RETURN 9FT ADLT (ELECTROSURGICAL) ×4
ELECTRODE REM PT RTRN 9FT ADLT (ELECTROSURGICAL) ×2 IMPLANT
EVACUATOR SILICONE 100CC (DRAIN) IMPLANT
GAUZE SPONGE 4X4 12PLY STRL (GAUZE/BANDAGES/DRESSINGS) ×4 IMPLANT
GLOVE BIO SURGEON STRL SZ 6.5 (GLOVE) ×3 IMPLANT
GLOVE BIO SURGEONS STRL SZ 6.5 (GLOVE) ×1
GLOVE BIOGEL M 6.5 STRL (GLOVE) ×4 IMPLANT
GLOVE BIOGEL PI IND STRL 6.5 (GLOVE) ×6 IMPLANT
GLOVE BIOGEL PI INDICATOR 6.5 (GLOVE) ×6
GLOVE ECLIPSE 6.5 STRL STRAW (GLOVE) ×4 IMPLANT
GLOVE SS BIOGEL STRL SZ 7.5 (GLOVE) ×2 IMPLANT
GLOVE SUPERSENSE BIOGEL SZ 7.5 (GLOVE) ×2
GOWN STRL REUS W/ TWL LRG LVL3 (GOWN DISPOSABLE) ×6 IMPLANT
GOWN STRL REUS W/TWL LRG LVL3 (GOWN DISPOSABLE) ×6
INSERT FOGARTY SM (MISCELLANEOUS) IMPLANT
KIT BASIN OR (CUSTOM PROCEDURE TRAY) ×4 IMPLANT
KIT REMOVER STAPLE SKIN (MISCELLANEOUS) ×4 IMPLANT
KIT ROOM TURNOVER OR (KITS) ×4 IMPLANT
NS IRRIG 1000ML POUR BTL (IV SOLUTION) ×8 IMPLANT
PACK PERIPHERAL VASCULAR (CUSTOM PROCEDURE TRAY) ×4 IMPLANT
PAD ARMBOARD 7.5X6 YLW CONV (MISCELLANEOUS) ×8 IMPLANT
PADDING CAST COTTON 6X4 STRL (CAST SUPPLIES) IMPLANT
SET COLLECT BLD 21X3/4 12 (NEEDLE) IMPLANT
STAPLER VISISTAT 35W (STAPLE) IMPLANT
STOPCOCK 4 WAY LG BORE MALE ST (IV SETS) IMPLANT
STRIP CLOSURE SKIN 1/2X4 (GAUZE/BANDAGES/DRESSINGS) ×3 IMPLANT
SUT ETHILON 3 0 PS 1 (SUTURE) ×8 IMPLANT
SUT PROLENE 5 0 C 1 24 (SUTURE) ×8 IMPLANT
SUT PROLENE 6 0 CC (SUTURE) ×4 IMPLANT
SUT SILK 2 0 SH (SUTURE) ×4 IMPLANT
SUT VIC AB 2-0 CTX 36 (SUTURE) ×8 IMPLANT
SUT VIC AB 3-0 SH 27 (SUTURE) ×4
SUT VIC AB 3-0 SH 27X BRD (SUTURE) ×4 IMPLANT
TAPE CLOTH SURG 4X10 WHT LF (GAUZE/BANDAGES/DRESSINGS) ×4 IMPLANT
TRAY FOLEY W/METER SILVER 16FR (SET/KITS/TRAYS/PACK) ×4 IMPLANT
TUBE ANAEROBIC SPECIMEN COL (MISCELLANEOUS) ×8 IMPLANT
TUBING EXTENTION W/L.L. (IV SETS) IMPLANT
UNDERPAD 30X30 INCONTINENT (UNDERPADS AND DIAPERS) ×4 IMPLANT
WATER STERILE IRR 1000ML POUR (IV SOLUTION) ×4 IMPLANT
YANKAUER SUCT BULB TIP NO VENT (SUCTIONS) ×4 IMPLANT

## 2015-03-16 NOTE — Anesthesia Preprocedure Evaluation (Signed)
Anesthesia Evaluation  Patient identified by MRN, date of birth, ID band Patient awake    Reviewed: Allergy & Precautions, NPO status , Patient's Chart, lab work & pertinent test results  Airway Mallampati: II  TM Distance: >3 FB Neck ROM: Full    Dental  (+) Edentulous Upper, Partial Lower   Pulmonary Current Smoker,  breath sounds clear to auscultation        Cardiovascular hypertension, Rhythm:Regular Rate:Normal     Neuro/Psych    GI/Hepatic   Endo/Other    Renal/GU      Musculoskeletal   Abdominal   Peds  Hematology   Anesthesia Other Findings   Reproductive/Obstetrics                             Anesthesia Physical Anesthesia Plan  ASA: III and emergent  Anesthesia Plan: General   Post-op Pain Management:    Induction: Intravenous  Airway Management Planned: Oral ETT  Additional Equipment: Arterial line and CVP  Intra-op Plan:   Post-operative Plan: Extubation in OR  Informed Consent: I have reviewed the patients History and Physical, chart, labs and discussed the procedure including the risks, benefits and alternatives for the proposed anesthesia with the patient or authorized representative who has indicated his/her understanding and acceptance.     Plan Discussed with: CRNA and Anesthesiologist  Anesthesia Plan Comments:         Anesthesia Quick Evaluation

## 2015-03-16 NOTE — Transfer of Care (Signed)
Immediate Anesthesia Transfer of Care Note  Patient: Briana Mosley  Procedure(s) Performed: Procedure(s): Exploration of  (N/A) Exploration of below the knee popliteal anastimosis, repair of femoral popliteal anastimosis, evacuation of hematoma right. (Right)  Patient Location: PACU  Anesthesia Type:General  Level of Consciousness: awake  Airway & Oxygen Therapy: Patient Spontanous Breathing and Patient connected to face mask oxygen  Post-op Assessment: Report given to RN and Post -op Vital signs reviewed and stable  Post vital signs: Reviewed and stable  Last Vitals:  Filed Vitals:   03/16/15 1355  BP: 139/83  Pulse: 115  Temp:   Resp: 20    Complications: No apparent anesthesia complications

## 2015-03-16 NOTE — Progress Notes (Signed)
Anesthesiology  Post-op H/H 6.7/21. Patient alert, hemodynamically stable. Will transfuse two units of PRBCs.  Roberts Gaudy

## 2015-03-16 NOTE — Anesthesia Procedure Notes (Signed)
Procedure Name: Intubation Date/Time: 03/16/2015 3:16 PM Performed by: Maude Leriche D Pre-anesthesia Checklist: Patient identified, Emergency Drugs available, Suction available, Patient being monitored and Timeout performed Patient Re-evaluated:Patient Re-evaluated prior to inductionOxygen Delivery Method: Circle system utilized Preoxygenation: Pre-oxygenation with 100% oxygen Intubation Type: IV induction Ventilation: Mask ventilation without difficulty Laryngoscope Size: Miller and 2 Grade View: Grade I Tube type: Oral Tube size: 7.0 mm Number of attempts: 1 Airway Equipment and Method: Stylet Placement Confirmation: ETT inserted through vocal cords under direct vision,  positive ETCO2 and breath sounds checked- equal and bilateral Secured at: 21 cm Tube secured with: Tape Dental Injury: Teeth and Oropharynx as per pre-operative assessment

## 2015-03-16 NOTE — ED Notes (Signed)
Pt already in gown, placed on monitor, continuous pulse oximetry and blood pressure cuff

## 2015-03-16 NOTE — Op Note (Signed)
    OPERATIVE REPORT  DATE OF SURGERY: 03/16/2015  PATIENT: Briana Mosley, 49 y.o. female MRN: [id number]  DOB: Jan 23, 1966  PRE-OPERATIVE DIAGNOSIS: Age or bleeding from right popliteal incision with resume disruption of below-knee anastomosis anastomosis  POST-OPERATIVE DIAGNOSIS:  Same  PROCEDURE: Revision of right popliteal incision, control of bleeding and repair of distal anastomosis  SURGEON:  Curt Jews, M.D.  PHYSICIAN ASSISTANT: Samantha Rhyne PA-C  ANESTHESIA:  Gen.  EBL: 100 ml     BLOOD ADMINISTERED: None  DRAINS: None  SPECIMEN: Aerobic and anaerobic cultures of wound  COUNTS CORRECT:  YES  PLAN OF CARE: PACU   PATIENT DISPOSITION:  PACU - hemodynamically stable  PROCEDURE DETAILS: Patient is a 10 day status post right femoral to below-knee popliteal bypass with non-reversed great saphenous vein with Dr. Bridgett Larsson. She was discharged from the hospital several days ago. She had a major bleed from the medial popliteal incision today and EMS brought her to [facility] then she was transferred to [place]. She is taken to the operating room for exploration  Patient was placed in supine position. The dressing was removed and there was obvious tenting of the skin from a large hematoma in the popliteal space. There was no active bleeding. There was some serous drainage from this area. The groin and leg were prepped and draped in usual sterile fashion. A sterile 24 cm tourniquet was positioned in the mid thigh. The staples were removed from the medial aspect of the popliteal incision. There was a large hematoma present. This was carefully debrided and on getting down to the level of the popliteal artery there was frank red blood. This initially was controlled with digital pressure at the anastomosis. The leg was elevated and exsanguinated with an Esmarch tourniquet and the pneumatic tourniquet was inflated. The remaining thrombus was removed from the popliteal  space. On further exploration of this area, it was clear that the lateral half of the below-knee popliteal anastomosis had disrupted. There was no evidence of frank pus present. The vein did appear to be of good size and the was and was a not friable. The patient had been given 6000 units heparin prior to inflation of tourniquet. The aiming a portion of the vein appeared to be intact. Under tourniquet control a 5-0 proline was used to reclose the lateral wall of the anastomosis. The artery appeared to be healthy with no evidence of infection in the arterial self. The before completion of the anastomosis the tourniquet was deflated and the usual flushing maneuvers were undertaken. The anastomosis completed and there was a good Doppler flow in the distal popliteal artery below the anastomosis which was graft dependent. The wounds were irrigated with saline. Hemostasis tablet cautery. No further bleeding sites were noted. The patient was given 50 mg of protamine to reverse the heparin area the wound was closed with 20 Vicryls and the fascial layer and interrupted 3-0 nylon mattress sutures were used to close the skin. Sterile dressing was applied and the patient was transferred to the recovery room in stable condition Curt Jews, M.D. 03/16/2015 4:57 PM

## 2015-03-16 NOTE — ED Notes (Signed)
Dr early at bedside.

## 2015-03-16 NOTE — ED Notes (Signed)
To ED via [place] -- from [facility]-- pt called 911 initially due to bleeding in lower right leg. Had fem-pop bypass graft on Thursday. Staples intact-- states was bending over and heard a pop-- leg started bleeding -- squirting blood across room, EMS states had an arterial bleeder-- became unresponsive at home, taken to [facility] --

## 2015-03-16 NOTE — H&P (Signed)
Patient name: Briana Mosley MRN: [id number] DOB: [birth year] Sex: female   Referred by: EDP  Reason for referral:  Chief Complaint  Patient presents with  . Post-op Problem    HISTORY OF PRESENT ILLNESS: The patient is a 49 year old who is 10 days status post right femoral to below-knee popliteal bypass with saphenous vein with Dr. Bridgett Larsson. She was discharged home. She called EMS today with severe bleeding from her left lower incision of the popliteal space. She was initially taken to [facility]. EMS reports great deal blood at the scene and also continued bleeding in their vehicle. She was hemodynamically stable at [facility] was transferred to [facility] for treatment. Patient is alert and oriented. She does report to moderate pain.  Past Medical History  Diagnosis Date  . Chronic back pain   . Hypertension   . Thyroid disease     hyperthyroidism  . Lower extremity weakness   . Anxiety   . GERD (gastroesophageal reflux disease)     otc med if needed    Past Surgical History  Procedure Laterality Date  . Back surgery    . Cholecystectomy    . Aortogram N/A 02/24/2015    Procedure: AORTOGRAM WITH BILATERAL LOWER EXTREMITY RUNOFF;  Surgeon: Conrad [place], MD;  Location: [place];  Service: Vascular;  Laterality: N/A;  . Cardiac catheterization N/A 02/28/2015    Procedure: Left Heart Cath and Coronary Angiography;  Surgeon: Adrian Prows, MD;  Location: [place] CV [facility];  Service: Cardiovascular;  Laterality: N/A;  . Femoral-popliteal bypass graft Right 03/06/2015    Procedure: RIGHT LEG FEMORAL-BELOW KNEE POPLITEAL ARTERY;  Surgeon: Conrad [place], MD;  Location: [place];  Service: Vascular;  Laterality: Right;    History   Social History  . Marital Status: Divorced    Spouse Name: N/A  . Number of Children: N/A  . Years of Education: N/A   Occupational History  . Not on file.   Social History Main Topics  . Smoking status: Current Every Day Smoker -- 1.00  packs/day for 30 years    Types: Cigarettes  . Smokeless tobacco: Never Used  . Alcohol Use: No  . Drug Use: No  . Sexual Activity: Not on file   Other Topics Concern  . Not on file   Social History Narrative    Family History  Problem Relation Age of Onset  . Cancer Mother   . Heart disease Father   . Diabetes Sister     Allergies as of 03/16/2015 - Review Complete 03/16/2015  Allergen Reaction Noted  . Bactrim  11/29/2011  . Neurontin [gabapentin]  11/29/2011  . Tramadol Nausea Only 01/27/2015  . Ciprofloxacin Other (See Comments) 02/24/2015  . Sulfa antibiotics Rash 11/29/2011    No current facility-administered medications on file prior to encounter.   Current Outpatient Prescriptions on File Prior to Encounter  Medication Sig Dispense Refill  . ALPRAZolam (XANAX) 0.5 MG tablet Take 0.5 tablets (0.25 mg total) by mouth 3 (three) times daily as needed for anxiety. (Patient not taking: Reported on 01/27/2015)    . aspirin EC 81 MG tablet Take 1 tablet (81 mg total) by mouth daily.    . baclofen (LIORESAL) 10 MG tablet Take 10 mg by mouth 4 (four) times daily.    . clonazePAM (KLONOPIN) 0.5 MG tablet Take 0.5 mg by mouth 3 (three) times daily as needed for anxiety.    . magnesium oxide (MAG-OX) 400 MG tablet  Take 400 mg by mouth daily.    . Oxycodone HCl 10 MG TABS Take 1 tablet (10 mg total) by mouth 4 (four) times daily as needed (pain). 30 tablet 0  . simvastatin (ZOCOR) 10 MG tablet Take 1 tablet (10 mg total) by mouth daily. 30 tablet 11     REVIEW OF SYSTEMS:  No change from recent admission and the above past medical history   PHYSICAL EXAMINATION:  General: The patient is a well-nourished female, in no acute distress. Vital signs are BP 139/83 mmHg  Pulse 115  Temp(Src) 98.4 F (36.9 C) (Oral)  Resp 20  SpO2 100%  LMP 11/04/2014 Pulmonary: There is a good air exchange Abdomen: Soft and non-tender  Musculoskeletal: There are no major deformities.   There is no significant extremity pain. Neurologic: No focal weakness or paresthesias are detected, Skin: There are no ulcer or rashes noted. Psychiatric: The patient has normal affect. Cardiovascular: Radial pulses bilaterally. She does have a pressure dressing intact over the calf. No active bleeding    Impression and Plan:  Major bleeding from right popliteal space 10 days out from femoropopliteal bypass. I assumed that she has a disruption of her below-knee anastomosis. Will be taken immediately to the operating room for exploration and repair    Briana Mosley [facility]: [phone]

## 2015-03-17 ENCOUNTER — Encounter (HOSPITAL_COMMUNITY): Payer: Self-pay | Admitting: Vascular Surgery

## 2015-03-17 ENCOUNTER — Inpatient Hospital Stay (HOSPITAL_COMMUNITY): Payer: Medicare Other

## 2015-03-17 ENCOUNTER — Encounter: Payer: Self-pay | Admitting: Vascular Surgery

## 2015-03-17 DIAGNOSIS — Z9889 Other specified postprocedural states: Secondary | ICD-10-CM

## 2015-03-17 LAB — URINALYSIS, ROUTINE W REFLEX MICROSCOPIC
Bilirubin Urine: NEGATIVE
Glucose, UA: NEGATIVE mg/dL
HGB URINE DIPSTICK: NEGATIVE
KETONES UR: NEGATIVE mg/dL
Nitrite: NEGATIVE
Protein, ur: NEGATIVE mg/dL
SPECIFIC GRAVITY, URINE: 1.01 (ref 1.005–1.030)
Urobilinogen, UA: 0.2 mg/dL (ref 0.0–1.0)
pH: 6 (ref 5.0–8.0)

## 2015-03-17 LAB — CBC
HEMATOCRIT: 28.6 % — AB (ref 36.0–46.0)
Hemoglobin: 9.5 g/dL — ABNORMAL LOW (ref 12.0–15.0)
MCH: 28.4 pg (ref 26.0–34.0)
MCHC: 33.2 g/dL (ref 30.0–36.0)
MCV: 85.4 fL (ref 78.0–100.0)
Platelets: 214 10*3/uL (ref 150–400)
RBC: 3.35 MIL/uL — ABNORMAL LOW (ref 3.87–5.11)
RDW: 14.2 % (ref 11.5–15.5)
WBC: 8.7 10*3/uL (ref 4.0–10.5)

## 2015-03-17 LAB — BASIC METABOLIC PANEL
Anion gap: 6 (ref 5–15)
BUN: 7 mg/dL (ref 6–20)
CHLORIDE: 107 mmol/L (ref 101–111)
CO2: 26 mmol/L (ref 22–32)
Calcium: 7.6 mg/dL — ABNORMAL LOW (ref 8.9–10.3)
Creatinine, Ser: 0.79 mg/dL (ref 0.44–1.00)
GFR calc non Af Amer: >60 mL/min (ref >60)
GLUCOSE: 88 mg/dL (ref 65–99)
Potassium: 3.8 mmol/L (ref 3.5–5.1)
Sodium: 139 mmol/L (ref 135–145)

## 2015-03-17 LAB — URINE MICROSCOPIC-ADD ON

## 2015-03-17 NOTE — Progress Notes (Signed)
UR COMPLETED  

## 2015-03-17 NOTE — Progress Notes (Signed)
VASCULAR LAB PRELIMINARY  ARTERIAL  ABI completed:  Right ABI in the mild to moderate range.  Left ABI indicates severe loss of arterial flow.  Please note:   patient unable to recline due to back problems.    RIGHT    LEFT    PRESSURE WAVEFORM  PRESSURE WAVEFORM  BRACHIAL 127 Triphasic  BRACHIAL 138 Triphasic   DP 96 Triphasic  DP 57 Dampened monophasic   AT   AT    PT 105 Monophasic  PT 65 Dampened monophasic   PER   PER    GREAT TOE  NA GREAT TOE  NA    RIGHT LEFT  ABI 0.76 increase since study of 03-06-15 0.47 decrease since study of 03-06-15     Briana Mosley, RVT 03/17/2015, 3:03 PM

## 2015-03-17 NOTE — Progress Notes (Addendum)
  Progress Note    03/17/2015 7:33 AM 1 Day Post-Op  Subjective:  C/o swelling in her leg  Afebrile HR 80's-90's NSR 283'T-517'O systolic (cuff) 16% RA  Filed Vitals:   03/17/15 0600  BP: 109/71  Pulse: 96  Temp:   Resp: 13    Physical Exam: Cardiac:  regular Lungs:  Non labored Incisions:  No active drainage-nylon sutures in place. Extremities:  + doppler signals right DP/PT/peroneal; + right leg swelling   CBC    Component Value Date/Time   WBC 8.7 03/17/2015 0455   RBC 3.35* 03/17/2015 0455   HGB 9.5* 03/17/2015 0455   HCT 28.6* 03/17/2015 0455   PLT 214 03/17/2015 0455   MCV 85.4 03/17/2015 0455   MCH 28.4 03/17/2015 0455   MCHC 33.2 03/17/2015 0455   RDW 14.2 03/17/2015 0455   LYMPHSABS 1.5 03/16/2015 1400   MONOABS 0.7 03/16/2015 1400   EOSABS 0.1 03/16/2015 1400   BASOSABS 0.0 03/16/2015 1400    BMET    Component Value Date/Time   NA 139 03/17/2015 0455   K 3.8 03/17/2015 0455   CL 107 03/17/2015 0455   CO2 26 03/17/2015 0455   GLUCOSE 88 03/17/2015 0455   BUN 7 03/17/2015 0455   CREATININE 0.79 03/17/2015 0455   CALCIUM 7.6* 03/17/2015 0455   GFRNONAA >60 03/17/2015 0455   GFRAA >60 03/17/2015 0455    INR    Component Value Date/Time   INR 1.13 02/28/2015 0306     Intake/Output Summary (Last 24 hours) at 03/17/15 0733 Last data filed at 03/17/15 0200  Gross per 24 hour  Intake 2472.5 ml  Output    375 ml  Net 2097.5 ml   Anaerobic gram stain wound: Gram Stain FEW WBC PRESENT, PREDOMINANTLY PMN  NO SQUAMOUS EPITHELIAL CELLS SEEN  NO ORGANISMS SEEN           Wound gram stain: Gram Stain FEW WBC PRESENT, PREDOMINANTLY PMN  NO SQUAMOUS EPITHELIAL CELLS SEEN  NO ORGANISMS SEEN               Assessment:  49 y.o. female is s/p:  Revision of right popliteal incision, control of bleeding and repair of distal anastomosis  1 Day Post-Op  Plan: -patent bypass graft with doppler signals present in the right  DP/PT/peroneal -acute blood loss anemia-received 2 units PRBC's and hgb up to 9.5 from 6.1 serosanguinous drainage on bandage this am, but no frank bloody drainage. -swelling in RLE-elevate as tolerated.  Having her legs down doesn't hurt her back and she may not tolerate elevation -DVT prophylaxis:  None-high risk of bleeding at this time -leave foley today-u/a sent yesterday as she had a lot of sediment when foley was placed.   -wound cultures are pending-gram stain reveals few WBC present-no organisms seen -pre-operative ulcers right leg healing -check cbc in am   Leontine Locket, PA-C [facility] [phone] 03/17/2015 7:33 AM    I have examined the patient, reviewed and agree with above.  Curt Jews, MD 03/17/2015 2:51 PM

## 2015-03-17 NOTE — Evaluation (Signed)
Physical Therapy Evaluation Patient Details Name: Briana Mosley MRN: [id number] DOB: 1966/05/04 Today's Date: 03/17/2015   History of Present Illness  Patient is a 49 y/o female who is 10 days s/p right femoral to below-knee popliteal bypass with saphenous vein who was discharged home and then returned due to severe bleeding from her incision now s/p revision of right popliteal incision, control of bleeding and repair of distal anastomosis. PMH includes back pain, HTN, anxiety.   Clinical Impression  Patient presents with pain, anxiety, weakness and balance deficits s/p above surgery impacting mobility. Pt very anxious during session and fearful of falling. Tolerated SPT to chair with Mod A for balance/safety. Concerned about pt returning home pending level of assist (per previous note son works at night). Refusing SNF. Will continue to follow to maximize independence and mobility prior to return home.     Follow Up Recommendations Home health PT;Supervision for mobility/OOB    Equipment Recommendations  None recommended by PT    Recommendations for Other Services OT consult     Precautions / Restrictions Precautions Precautions: Fall Restrictions Weight Bearing Restrictions: No      Mobility  Bed Mobility Overal bed mobility: Needs Assistance Bed Mobility: Supine to Sit     Supine to sit: Mod assist     General bed mobility comments: Assist with bringing BLEs to EOB, and to elevate trunk/scoot bottom to EOB. Increased time due to pain/anxiety.  Transfers Overall transfer level: Needs assistance Equipment used: Rolling walker (2 wheeled) Transfers: Sit to/from Stand Sit to Stand: Mod assist Stand pivot transfers: Mod assist       General transfer comment: Despite cues for hand placement, pt pulling up on RW. Mod A for anterior weight shift and to power up to standing. Anxious, "don't let me fall." "Can you even lift me?" Able to shuffle feet to transfer to  chair.  Ambulation/Gait                Stairs            Wheelchair Mobility    Modified Rankin (Stroke Patients Only)       Balance Overall balance assessment: Needs assistance   Sitting balance-Leahy Scale: Fair     Standing balance support: During functional activity;Bilateral upper extremity supported Standing balance-Leahy Scale: Poor Standing balance comment: Relient on RW.                              Pertinent Vitals/Pain Pain Assessment: Faces Faces Pain Scale: Hurts whole lot Pain Location: back and RLE Pain Descriptors / Indicators: Aching;Sore Pain Intervention(s): Monitored during session;Premedicated before session;Repositioned;Limited activity within patient's tolerance    Home Living Family/patient expects to be discharged to:: Private residence Living Arrangements: Children Available Help at Discharge: Family;Friend(s) Type of Home: Mobile home Home Access: Stairs to enter   Entrance Stairs-Number of Steps: 2 Home Layout: One level Home Equipment: Tub bench;Walker - 2 wheels;Bedside commode;Wheelchair - manual Additional Comments: Information above taken from previous admission ~1 week ago. Today, pt reports no steps at home?     Prior Function Level of Independence: Independent with assistive device(s)         Comments: Pt reports working with HHPT and walking with her PT from 1 side of the house to the other. Uses w/c otherwise. "I can for myself" Son assists with IADLs.     Hand Dominance   Dominant Hand: Right  Extremity/Trunk Assessment   Upper Extremity Assessment: Defer to OT evaluation           Lower Extremity Assessment: Generalized weakness (Pt declining formal testing of LEs. Able to stand with increased knee/hip flexion bilaterally. No knee buckling.)      Cervical / Trunk Assessment: Normal  Communication   Communication: No difficulties  Cognition Arousal/Alertness: Awake/alert Behavior  During Therapy: Anxious Overall Cognitive Status: Within Functional Limits for tasks assessed                      General Comments General comments (skin integrity, edema, etc.): HR ranged from 109-130 bpm during activity. Sa02 stable. After transfer, pt not allowing therapist to replace leads. RN notified.     Exercises        Assessment/Plan    PT Assessment Patient needs continued PT services  PT Diagnosis Difficulty walking;Acute pain;Generalized weakness   PT Problem List Decreased strength;Pain;Decreased activity tolerance;Decreased balance;Decreased mobility  PT Treatment Interventions DME instruction;Balance training;Gait training;Functional mobility training;Therapeutic activities;Therapeutic exercise;Patient/family education;Stair training   PT Goals (Current goals can be found in the Care Plan section) Acute Rehab PT Goals Patient Stated Goal: to get back home ASAP PT Goal Formulation: With patient Time For Goal Achievement: 03/31/15 Potential to Achieve Goals: Fair    Frequency Min 3X/week   Barriers to discharge Other (comment)      Co-evaluation               End of Session Equipment Utilized During Treatment: Gait belt Activity Tolerance: Patient limited by pain Patient left: in chair;with call bell/phone within reach Nurse Communication: Mobility status         Time: 0737-1062 PT Time Calculation (min) (ACUTE ONLY): 23 min   Charges:   PT Evaluation $Initial PT Evaluation Tier I: 1 Procedure PT Treatments $Therapeutic Activity: 8-22 mins   PT G Codes:        Tayshun Gappa A Aayat Hajjar 03/17/2015, 11:14 AM  Wray Kearns, PT, DPT [phone]

## 2015-03-17 NOTE — Progress Notes (Signed)
Pt transferred from the PACU around 2030, admitted to Rm/3s14. She is alert and oriented x4, complaints of back pain (chronic). Blood infusing, started in PACU. Incision to Right leg noted with staples to thigh and gauze covering calf area, moderated drainage marked. Placed on telemetry, currently NSR. Oriented to room, instructed to call for assistance before getting out of bed. Will continue to monitor

## 2015-03-17 NOTE — Progress Notes (Signed)
Attempted to mobilize pt to the chair this AM but pt refused stating that she is having back spasms and wants to sleep. Pt states that she has chronic back pain and she is more comfortable in the chair with less pain but does not wish to move at this time.  Pt requesting briefs or depends before removing Foley, will bring them from home

## 2015-03-17 NOTE — Progress Notes (Signed)
Medicare Important Message given? YES (If response is "NO", the following Medicare IM given date fields will be blank) Date Medicare IM given:03/17/15 Medicare IM given by: Tykia Mellone 

## 2015-03-17 NOTE — Evaluation (Signed)
Occupational Therapy Evaluation Patient Details Name: Briana Mosley MRN: [id number] DOB: 29-Nov-1965 Today's Date: 03/17/2015    History of Present Illness Patient is a 48 y/o female who is 10 days s/p right femoral to below-knee popliteal bypass with saphenous vein who was discharged home and then returned due to severe bleeding from her incision now s/p revision of right popliteal incision, control of bleeding and repair of distal anastomosis. PMH includes back pain, HTN, anxiety.    Clinical Impression   Pt admitted with above. She demonstrates the below listed deficits and will benefit from continued OT to maximize safety and independence with BADLs.  Pt presents to OT with generalized weakness, impaired balance, and increased pain.  Currently, she requires max A for LB ADLs and mod A for pivot transfers.  She is limited by fear of falling and increased pain.  She is insistent on going home at discharge with [facility] therapies.  Recommend 24 hour assistance at discharge.  She has all needed DME.       Follow Up Recommendations  Home health OT;Supervision/Assistance - 24 hour    Equipment Recommendations  None recommended by OT    Recommendations for Other Services       Precautions / Restrictions Precautions Precautions: Fall      Mobility Bed Mobility                  Transfers Overall transfer level: Needs assistance Equipment used: Rolling walker (2 wheeled) Transfers: Sit to/from [facility] Sit to Stand: Mod assist         General transfer comment: Pt requires verbal cues for hand placement and assist to power up into standing.  Pt is very fearful of falling     Balance Overall balance assessment: Needs assistance Sitting-balance support: Feet supported Sitting balance-Leahy Scale: Fair     Standing balance support: During functional activity Standing balance-Leahy Scale: Poor Standing balance comment: reliant on bil. UE support                              ADL Overall ADL's : Needs assistance/impaired Eating/Feeding: Independent   Grooming: Wash/dry hands;Wash/dry face;Oral care;Brushing hair;Set up;Sitting   Upper Body Bathing: Set up;Sitting   Lower Body Bathing: Maximal assistance;Sit to/from stand   Upper Body Dressing : Set up;Sitting   Lower Body Dressing: Total assistance;Sit to/from stand   Toilet Transfer: Moderate assistance;Stand-pivot;BSC;RW   Toileting- Clothing Manipulation and Hygiene: Maximal assistance;Sit to/from stand       Functional mobility during ADLs: Moderate assistance;Rolling walker (sit to stand only) General ADL Comments: Pt is very anxious and very fearful of falling.  She is unable to access feet due to bil. foot pain      Vision     Perception     Praxis      Pertinent Vitals/Pain Pain Assessment: 0-10 Pain Score: 7  Pain Location: back and Rt LE  Pain Descriptors / Indicators: Aching;Constant;Sharp Pain Intervention(s): Monitored during session;Repositioned;Patient requesting pain meds-RN notified     Hand Dominance Right   Extremity/Trunk Assessment Upper Extremity Assessment Upper Extremity Assessment: Generalized weakness   Lower Extremity Assessment Lower Extremity Assessment: Defer to PT evaluation   Cervical / Trunk Assessment Cervical / Trunk Assessment: Normal   Communication Communication Communication: No difficulties   Cognition Arousal/Alertness: Awake/alert Behavior During Therapy: Anxious Overall Cognitive Status: Within Functional Limits for tasks assessed  General Comments       Exercises       Shoulder Instructions      Home Living Family/patient expects to be discharged to:: Private residence Living Arrangements: Children Available Help at Discharge: Family;Friend(s) Type of Home: Mobile home Home Access: Stairs to enter Entrance Stairs-Number of Steps: 2   Home Layout: One level      Bathroom Shower/Tub: Tub/shower unit;Curtain Shower/tub characteristics: Architectural technologist: Standard     Home Equipment: Tub bench;Walker - 2 wheels;Bedside commode;Wheelchair - manual   Additional Comments: Information above taken from previous admission ~1 week ago. Today, pt reports no steps at home?       Prior Functioning/Environment Level of Independence: Independent with assistive device(s)        Comments: Pt reports working with HHPT and walking with her PT from 1 side of the house to the other. Uses w/c otherwise. "I can for myself" Son assists with IADLs.    OT Diagnosis: Generalized weakness;Acute pain   OT Problem List: Decreased activity tolerance;Impaired balance (sitting and/or standing);Decreased safety awareness;Decreased knowledge of use of DME or AE;Pain   OT Treatment/Interventions: Self-care/ADL training;DME and/or AE instruction;Therapeutic activities;Patient/family education;Balance training    OT Goals(Current goals can be found in the care plan section) Acute Rehab OT Goals Patient Stated Goal: to get back home ASAP OT Goal Formulation: With patient Time For Goal Achievement: 03/31/15 Potential to Achieve Goals: Good ADL Goals Pt Will Perform Grooming: with min assist;standing Pt Will Perform Lower Body Bathing: with min assist;with adaptive equipment;sit to/from stand Pt Will Perform Lower Body Dressing: with min assist;with adaptive equipment;sit to/from stand Pt Will Transfer to Toilet: with min assist;ambulating;regular height toilet;bedside commode;grab bars Pt Will Perform Toileting - Clothing Manipulation and hygiene: with min assist;sit to/from stand  OT Frequency: Min 2X/week   Barriers to D/C: Decreased caregiver support          Co-evaluation              End of Session Equipment Utilized During Treatment: Rolling walker Nurse Communication: Mobility status  Activity Tolerance: Patient limited by pain Patient left:  in chair;with call bell/phone within reach   Time: 1512-1536 OT Time Calculation (min): 24 min Charges:  OT General Charges $OT Visit: 1 Procedure OT Evaluation $Initial OT Evaluation Tier I: 1 Procedure OT Treatments $Therapeutic Activity: 8-22 mins G-Codes:    Raneisha Bress M 05-Apr-2015, 3:50 PM

## 2015-03-18 ENCOUNTER — Encounter (HOSPITAL_COMMUNITY): Payer: Self-pay | Admitting: General Practice

## 2015-03-18 LAB — CBC
HEMATOCRIT: 26.7 % — AB (ref 36.0–46.0)
Hemoglobin: 9 g/dL — ABNORMAL LOW (ref 12.0–15.0)
MCH: 29.1 pg (ref 26.0–34.0)
MCHC: 33.7 g/dL (ref 30.0–36.0)
MCV: 86.4 fL (ref 78.0–100.0)
Platelets: 172 10*3/uL (ref 150–400)
RBC: 3.09 MIL/uL — ABNORMAL LOW (ref 3.87–5.11)
RDW: 14.6 % (ref 11.5–15.5)
WBC: 7.3 10*3/uL (ref 4.0–10.5)

## 2015-03-18 LAB — TYPE AND SCREEN
ABO/RH(D): A NEG
Antibody Screen: NEGATIVE
UNIT DIVISION: 0
Unit division: 0

## 2015-03-18 NOTE — Progress Notes (Signed)
Subjective: Interval History: none.. Comfortable up in chair  Objective: Vital signs in last 24 hours: Temp:  [98.4 F (36.9 C)-99.6 F (37.6 C)] 98.6 F (37 C) (06/13 2328) Pulse Rate:  [99-102] 99 (06/13 2328) Resp:  [11-20] 11 (06/13 2328) BP: (114-145)/(59-63) 144/59 mmHg (06/13 2328) SpO2:  [92 %-100 %] 100 % (06/13 2328)  Intake/Output from previous day: 06/13 0701 - 06/14 0700 In: -  Out: 1300 [Urine:1300] Intake/Output this shift: Total I/O In: -  Out: 450 [Urine:450]  Serous drainage from popliteal wound. Foot well-perfused.  Lab Results:  Recent Labs  03/17/15 0455 03/18/15 0510  WBC 8.7 7.3  HGB 9.5* 9.0*  HCT 28.6* 26.7*  PLT 214 172   BMET  Recent Labs  03/16/15 1400 03/16/15 1639 03/17/15 0455  NA 139 140 139  K 3.8 3.7 3.8  CL 106  --  107  CO2 26  --  26  GLUCOSE 124* 129* 88  BUN 6  --  7  CREATININE 0.72  --  0.79  CALCIUM 7.9*  --  7.6*    Studies/Results: Dg Chest 2 View  02/26/2015   CLINICAL DATA:  COPD, long-term tobacco use, peripheral vascular disease.  EXAM: CHEST  2 VIEW  COMPARISON:  AP chest x-ray dated November 29, 2011  FINDINGS: The lungs are mildly hyperinflated and clear. The heart and pulmonary vascularity are normal. The mediastinum is normal in width. There is no pleural effusion. There is stable levocurvature of the thoracolumbar spine.  IMPRESSION: Hyperinflation consistent with COPD. There is no active cardiopulmonary disease.   Electronically Signed   By: David  Martinique M.D.   On: 02/26/2015 08:17   Nm Myocar Multi W/spect W/wall Motion / Ef  02/28/2015    Findings consistent with ischemia.  This is an intermediate risk study.  The left ventricular ejection fraction is normal (55-65%).  Defect 1: There is a small defect of mild severity present in the mid  anterior, apical anterior and apical septal location.    Dg Chest Port 1 View  03/16/2015   CLINICAL DATA:  Central venous catheter placement.  EXAM: PORTABLE  CHEST - 1 VIEW  COMPARISON:  02/26/2015 and prior radiographs  FINDINGS: The cardiomediastinal silhouette is unremarkable.  A left IJ central venous catheter is present with tip overlying the upper SVC.  There is no evidence of focal airspace disease, pulmonary edema, suspicious pulmonary nodule/mass, pleural effusion, or pneumothorax. No acute bony abnormalities are identified.  IMPRESSION: Left IJ central venous catheter with tip overlying the upper SVC. No evidence of pneumothorax.   Electronically Signed   By: Margarette Canada M.D.   On: 03/16/2015 20:42   Anti-infectives: Anti-infectives    Start     Dose/Rate Route Frequency Ordered Stop   03/16/15 2200  cefUROXime (ZINACEF) 1.5 g in dextrose 5 % 50 mL IVPB     1.5 g 100 mL/hr over 30 Minutes Intravenous Every 12 hours 03/16/15 2128 03/17/15 0942   03/16/15 1445  cefUROXime (ZINACEF) 1.5 g in dextrose 5 % 50 mL IVPB  Status:  Discontinued     1.5 g 100 mL/hr over 30 Minutes Intravenous To Surgery 03/16/15 1443 03/16/15 1918      Assessment/Plan: s/p Procedure(s): Exploration of  (N/A) Exploration of below the knee popliteal anastimosis, repair of femoral popliteal anastimosis, evacuation of hematoma right. (Right) Transfer to 2 [place] to mobilize. DC Foley. Possible discharge in the morning.   LOS: 2 days   Curt Jews 03/18/2015, 6:59  AM    

## 2015-03-18 NOTE — Care Management Note (Signed)
Case Management Note  Patient Details  Name: KAMMY KLETT MRN: [id number] Date of Birth: 1965-12-01  Subjective/Objective:          Pt from home  who is 10 days status post right femoral to below-knee popliteal bypass. Readmitted with severe bleeding from her left lower incision of the popliteal space.     Action/Plan: Return to home when medically stable. CM to f/u with d/c needs.  Expected Discharge Date:                  Expected Discharge Plan:  [place]  In-House Referral:     Discharge planning Services  CM Consult  Post Acute Care Choice:    Choice offered to:     DME Arranged:    DME Agency:     HH Arranged:    [place] Agency:     Status of Service:  In process, will continue to follow  Medicare Important Message Given:    Date Medicare IM Given:    Medicare IM give by:    Date Additional Medicare IM Given:    Additional Medicare Important Message give by:     If discussed at [place] of Stay Meetings, dates discussed:    Additional Comments: HH SERVICES WAS SETUP WITH [facility] FOR RN/PT/OT WITH LAST ADMIT. Whitman Hero [place], RN 03/18/2015, 9:44 AM

## 2015-03-19 ENCOUNTER — Encounter (HOSPITAL_COMMUNITY): Payer: Self-pay

## 2015-03-19 LAB — WOUND CULTURE

## 2015-03-19 MED ORDER — LEVOFLOXACIN 750 MG PO TABS
750.0000 mg | ORAL_TABLET | Freq: Every day | ORAL | Status: DC
  Administered 2015-03-19 – 2015-03-21 (×3): 750 mg via ORAL
  Filled 2015-03-19 (×3): qty 1

## 2015-03-19 MED ORDER — CEFPODOXIME PROXETIL 200 MG PO TABS
200.0000 mg | ORAL_TABLET | Freq: Two times a day (BID) | ORAL | Status: DC
Start: 2015-03-19 — End: 2015-03-19
  Filled 2015-03-19 (×2): qty 1

## 2015-03-19 NOTE — Progress Notes (Signed)
Physical Therapy Treatment Patient Details Name: Briana Mosley MRN: [id number] DOB: 1966-01-31 Today's Date: 03/19/2015    History of Present Illness Patient is a 49 y/o female who is 10 days s/p right femoral to below-knee popliteal bypass with saphenous vein who was discharged home and then returned due to severe bleeding from her incision now s/p revision of right popliteal incision, control of bleeding and repair of distal anastomosis. PMH includes back pain, HTN, anxiety.     PT Comments    Progressing towards physical therapy goals. Ambulates very slowly up to 20 feet today with close guard for safety. Min assist to rise from chair. States she is primarily wheelchair bound at home but transfers without assistance. Tolerated exercises well, eager to return home Patient will continue to benefit from skilled physical therapy services to further improve independence with functional mobility.   Follow Up Recommendations  Home health PT;Supervision for mobility/OOB     Equipment Recommendations  None recommended by PT    Recommendations for Other Services OT consult     Precautions / Restrictions Precautions Precautions: Fall Restrictions Weight Bearing Restrictions: No    Mobility  Bed Mobility               General bed mobility comments: sitting in chair  Transfers Overall transfer level: Needs assistance Equipment used: Rolling walker (2 wheeled) Transfers: Sit to/from Stand Sit to Stand: Min assist         General transfer comment: Min assist for boost to stand from reclining chair with VC for hand placement. Good stability once upright holding RW for support.  Ambulation/Gait Ambulation/Gait assistance: Min guard Ambulation Distance (Feet): 20 Feet Assistive device: Rolling walker (2 wheeled) Gait Pattern/deviations: Step-to pattern;Decreased step length - left;Decreased stance time - right;Decreased dorsiflexion - left;Decreased dorsiflexion -  right;Shuffle;Antalgic;Trunk flexed;Wide base of support Gait velocity: very slow   General Gait Details: Close guard for safety. Moderately antalgic with some difficulty of foot placement. Relies heavily on RW for support. Cues for walker placement and upright posture as able however increased back pain with upright posture.   Stairs            Wheelchair Mobility    Modified Rankin (Stroke Patients Only)       Balance                                    Cognition Arousal/Alertness: Awake/alert Behavior During Therapy: Anxious Overall Cognitive Status: Within Functional Limits for tasks assessed                      Exercises General Exercises - Lower Extremity Ankle Circles/Pumps: AROM;Both;10 reps;Seated Quad Sets: AROM;Right;10 reps;Seated Long Arc Quad: AROM;Both;Seated;5 reps Hip Flexion/Marching: Strengthening;Both;5 reps;Seated Other Exercises Other Exercises: seated hip abduction/adduction x5    General Comments General comments (skin integrity, edema, etc.): HR to 112 while ambulating.  Explained to keep RLE elevated to heart level with pillows. Left pt in this position in recliner.      Pertinent Vitals/Pain Pain Assessment: 0-10 Pain Score:  ("Feels like it's going to bust" no value given) Pain Location: RLE Pain Descriptors / Indicators: Pressure ("bursting") Pain Intervention(s): Monitored during session;Repositioned    Home Living                      Prior Function  PT Goals (current goals can now be found in the care plan section) Acute Rehab PT Goals Patient Stated Goal: to get back home ASAP PT Goal Formulation: With patient Time For Goal Achievement: 03/31/15 Potential to Achieve Goals: Fair Progress towards PT goals: Progressing toward goals    Frequency  Min 3X/week    PT Plan Current plan remains appropriate    Co-evaluation             End of Session Equipment Utilized During  Treatment: Gait belt Activity Tolerance: Patient limited by fatigue Patient left: in chair;with call bell/phone within reach     Time: 1111-1131 PT Time Calculation (min) (ACUTE ONLY): 20 min  Charges:  $Gait Training: 8-22 mins                    G Codes:      Ellouise Newer 04/09/15, 12:05 PM Elayne Snare, [place]

## 2015-03-19 NOTE — Progress Notes (Signed)
Pt refused to have foley removed. Pt educated. Pt stated she will like it removed later the following morning.    Raliegh Ip RN

## 2015-03-19 NOTE — Progress Notes (Addendum)
[facility] [place]  Subjective  - Doing well over all, no new complaints.     Objective 126/60 88 98.1 F (36.7 C) (Oral) 18 100%  Intake/Output Summary (Last 24 hours) at 03/19/15 0741 Last data filed at 03/19/15 4982  Gross per 24 hour  Intake    600 ml  Output   2650 ml  Net  -2050 ml    Right LE sever edema, distal incision with SS drainage clean dry dressing applied Lateral malleolus wound clean with beefy red base Foot warm and well perfused  Assessment/Planning: POD # 3 Exploration of below the knee popliteal anastimosis, repair of femoral popliteal anastimosis, evacuation of hematoma right.  D/C foley will start cipro PO for current UTI 03/17/2015 cefpodoxime per pharmacy for 7 days due to allergies.   I spoke with the pharmacy department and at this time she does not have a WBC count and is afebrile.  We will not treat her UA as an active infection.  Post op the wound had a few pseudomonas Aeruginosa present.  At this time I will discuss further planning with Dr. Donnetta Hutching.  Elevation of right LE above the level of the heart as much as possible   Laurence Slate [facility] 03/19/2015 7:41 AM --  Laboratory Lab Results:  Recent Labs  03/17/15 0455 03/18/15 0510  WBC 8.7 7.3  HGB 9.5* 9.0*  HCT 28.6* 26.7*  PLT 214 172   BMET  Recent Labs  03/16/15 1400 03/16/15 1639 03/17/15 0455  NA 139 140 139  K 3.8 3.7 3.8  CL 106  --  107  CO2 26  --  26  GLUCOSE 124* 129* 88  BUN 6  --  7  CREATININE 0.72  --  0.79  CALCIUM 7.9*  --  7.6*    COAG Lab Results  Component Value Date   INR 1.13 02/28/2015   No results found for: PTT    I have examined the patient, reviewed and agree with above.  Curt Jews, MD 03/19/2015 4:53 PM

## 2015-03-20 NOTE — Progress Notes (Signed)
UR Completed. Charnay Nazario, RN, BSN.  [phone] 

## 2015-03-20 NOTE — Progress Notes (Addendum)
[facility]   Subjective  - She wants to go home as soon as she can.   Objective 119/55 95 98.7 F (37.1 C) (Oral) 18 98%  Intake/Output Summary (Last 24 hours) at 03/20/15 0751 Last data filed at 03/19/15 1514  Gross per 24 hour  Intake    360 ml  Output      0 ml  Net    360 ml    Doppler DP/PT right SS drainage sever edema, no erythema   Assessment/Planning: POD # 4 Exploration of below the knee popliteal anastimosis, repair of femoral popliteal anastimosis, evacuation of hematoma right. Oral antibiotics Levaquin for Post op the wound had a few pseudomonas Aeruginosa present.   Will discuss discharge planning.   Laurence Slate [facility] 03/20/2015 7:51 AM --  Laboratory Lab Results:  Recent Labs  03/18/15 0510  WBC 7.3  HGB 9.0*  HCT 26.7*  PLT 172   BMET No results for input(s): NA, K, CL, CO2, GLUCOSE, BUN, CREATININE, CALCIUM in the last 72 hours.  COAG Lab Results  Component Value Date   INR 1.13 02/28/2015   No results found for: PTT    I have examined the patient, reviewed and agree with above. Continues to do well. Serous drainage from below knee popliteal incision. Has marked venous stasis changes with marked edema. Will plan on discharge tomorrow if she remains afebrile. Will DC staples and right leg  Curt Jews, MD 03/20/2015 8:41 AM

## 2015-03-20 NOTE — Care Management Note (Addendum)
Case Management Note  Patient Details  Name: Briana Mosley MRN: [id number] Date of Birth: 13-Aug-1966  Subjective/Objective:   Pt admitted with PAD             Action/Plan:  Pt is active with [facility] for RN, PT, OT.  CM will ask for resumption/new order from [place] depending on [facility] therapist recommendations.  CM will continue to monitor for disposition needs   Expected Discharge Date:                  Expected Discharge Plan:  [place]  In-House Referral:     Discharge planning Services  CM Consult  Post Acute Care Choice:  Resumption of Svcs/PTA Provider Choice offered to:  Patient  DME Arranged:    DME Agency:     HH Arranged:  RN, PT, OT [place] Agency:  [place]  Status of Service:  In process, will continue to follow  Medicare Important Message Given:  Yes Date Medicare IM Given:  03/19/15 Medicare IM give by:  Marvetta Gibbons RN, BSN  Date Additional Medicare IM Given:    Additional Medicare Important Message give by:     If discussed at [place] of Stay Meetings, dates discussed:    Additional Comments: 03/20/15 Elenor Quinones, RN, BSN [phone] CM asked bedside nurse to request resumption orders of [facility] services, CM talked with PA; all services should be resumed post discharge.  PT/OT eval this admit recommending [facility] PT and [place] OT.  CM contacted [facility] and informed that pt is admitted, referral for resumption was accepted.  Pt states she already has the following equipment; wheelchair, walker and shower chair.    Maryclare Labrador, RN 03/20/2015, 11:46 AM

## 2015-03-20 NOTE — Progress Notes (Signed)
       Care management was consulted for home health needs.  Plan discharge for tomorrow.    Moroni Nester MAUREEN PA-C

## 2015-03-20 NOTE — Progress Notes (Signed)
Removed all staples from right leg.  Minimal distress noted during the pulling.  Pre-medication given and pulled everyother staple in two different sessions so the patient would tollerate better.

## 2015-03-21 LAB — ANAEROBIC CULTURE

## 2015-03-21 MED ORDER — LEVOFLOXACIN 750 MG PO TABS: 750.0000 mg | ORAL_TABLET | Freq: Every day | ORAL | Status: DC

## 2015-03-21 NOTE — Care Management Note (Signed)
Case Management Note  Patient Details  Name: Briana Mosley MRN: [id number] Date of Birth: 11/27/1965  Subjective/Objective:          Pt from home  who is 10 days status post right femoral to below-knee popliteal bypass. Readmitted with severe bleeding from her left lower incision of the popliteal space.     Action/Plan: Return to home when medically stable. CM to f/u with d/c needs.  Expected Discharge Date:       03/21/15           Expected Discharge Plan:  [place]  In-House Referral:     Discharge planning Services  CM Consult  Post Acute Care Choice:  Resumption of Svcs/PTA Provider Choice offered to:  Patient  DME Arranged:    DME Agency:     HH Arranged:  RN, PT, OT, Nurse's Aide [place] Agency:  [place]  Status of Service:  Completed, signed off  Medicare Important Message Given:  Yes Date Medicare IM Given:  03/19/15 Medicare IM give by:  Marvetta Gibbons RN, BSN  Date Additional Medicare IM Given:    Additional Medicare Important Message give by:     If discussed at [place] of Stay Meetings, dates discussed:    Additional Comments:  03/21/15- notified Santiago Glad with [facility] of pt's pending discharge for today.  HH SERVICES WAS SETUP WITH [facility] FOR RN/PT/OT WITH LAST ADMIT. Dawayne Patricia, RN 03/21/2015, 4:10 PM

## 2015-03-21 NOTE — Progress Notes (Signed)
Physical Therapy Treatment Patient Details Name: Briana Mosley MRN: [id number] DOB: 05-Aug-1966 Today's Date: 03/21/2015    History of Present Illness Patient is a 49 y/o female who is 10 days s/p right femoral to below-knee popliteal bypass with saphenous vein who was discharged home and then returned due to severe bleeding from her incision now s/p revision of right popliteal incision, control of bleeding and repair of distal anastomosis. PMH includes back pain, HTN, anxiety.     PT Comments    Pt admitted with above diagnosis. Pt currently with functional limitations due to balance and endurance deficits.Feel pt would benefit from short Rehab stay due to slow progress.  Pt needs mod assist for most mobility.   Pt will benefit from skilled PT to increase their independence and safety with mobility to allow discharge to the venue listed below.    Follow Up Recommendations  SNF;Supervision/Assistance - 24 hour     Equipment Recommendations  None recommended by PT    Recommendations for Other Services       Precautions / Restrictions Precautions Precautions: Fall Restrictions Weight Bearing Restrictions: No    Mobility  Bed Mobility Overal bed mobility: Needs Assistance Bed Mobility: Supine to Sit     Supine to sit: Min assist     General bed mobility comments: Needed some assist for LEs and use of pad to get pt to eOb.   Transfers Overall transfer level: Needs assistance Equipment used: Rolling walker (2 wheeled) Transfers: Sit to/from Stand Sit to Stand: Mod assist Stand pivot transfers: Mod assist       General transfer comment: Mod assist for boost to stand from bed with VC for hand placement. fair stability once upright holding RW for support with pt slightly unsteady until she widened BOS.  Pt took about 4 steps to recliner with bil knee instability noted with bil knee hyperextension.  Pt would not ambulate further and needed assist to control descent into  chair.    Ambulation/Gait                 Stairs            Wheelchair Mobility    Modified Rankin (Stroke Patients Only)       Balance Overall balance assessment: Needs assistance;History of Falls Sitting-balance support: No upper extremity supported;Feet supported Sitting balance-Leahy Scale: Fair     Standing balance support: Bilateral upper extremity supported;During functional activity Standing balance-Leahy Scale: Poor Standing balance comment: reliant on bil UE support. Leans posteriorly in static stance until she widens BOS.                      Cognition Arousal/Alertness: Awake/alert Behavior During Therapy: Anxious Overall Cognitive Status: Within Functional Limits for tasks assessed                      Exercises General Exercises - Lower Extremity Ankle Circles/Pumps: AROM;Both;10 reps;Seated Long Arc Quad: AROM;Both;Seated;5 reps Hip Flexion/Marching: Strengthening;Both;5 reps;Seated    General Comments        Pertinent Vitals/Pain Pain Assessment: No/denies pain' VSS    Home Living                      Prior Function            PT Goals (current goals can now be found in the care plan section) Progress towards PT goals: Progressing toward goals (although slowly)    Frequency  Min 3X/week    PT Plan Discharge plan needs to be updated    Co-evaluation             End of Session Equipment Utilized During Treatment: Gait belt Activity Tolerance: Patient limited by fatigue Patient left: in chair;with call bell/phone within reach     Time: 1237-1247 PT Time Calculation (min) (ACUTE ONLY): 10 min  Charges:  $Gait Training: 8-22 mins                    G CodesDenice Paradise 04/15/2015, 4:43 PM [employer] Acute Rehabilitation [phone] (pager)

## 2015-03-21 NOTE — Progress Notes (Signed)
Left IJ removed per policy. Pt tolerated well with no issues.

## 2015-03-21 NOTE — Progress Notes (Addendum)
  [facility] Progress Note  Subjective  - POD #5  No complaints. Ready to go home.   Objective Filed Vitals:   03/21/15 0420  BP: 146/80  Pulse: 100  Temp: 98.9 F (37.2 C)  Resp: 18    Intake/Output Summary (Last 24 hours) at 03/21/15 0844 Last data filed at 03/21/15 0839  Gross per 24 hour  Intake    480 ml  Output      0 ml  Net    480 ml   Right leg markedly edematous, excellent doppler flow to right DP and PT Right groin incision with mild separation and fibrinous exudate. Right above knee incision with some serous drainage and slight separation Right below knee  Incision with some separation, nylon sutures intact and serous drainage.   Assessment/Planning: 49 y.o. female is s/p: exploration of below knee popliteal anastomosis, repair of femoral popliteal anastomosis and evacuation of right below knee hematoma 5 Days Post-Op   Has been afebrile. Bypass is patent. Severe edema of right leg. Expressed need for elevation of leg above heart.  Wound grew few pseudomonas. Started on levaquin yesterday. Continue home health wound care, PT and OT. Explained to patient importance of skin hygiene and elevation of leg to prevent further wound separation and breakdown.  Discharge home today on levaquin. F/u with Dr. Bridgett Larsson on 04/04/15.   Alvia Grove, PA-C 03/21/2015 8:44 AM --  Laboratory CBC    Component Value Date/Time   WBC 7.3 03/18/2015 0510   HGB 9.0* 03/18/2015 0510   HCT 26.7* 03/18/2015 0510   PLT 172 03/18/2015 0510    BMET    Component Value Date/Time   NA 139 03/17/2015 0455   K 3.8 03/17/2015 0455   CL 107 03/17/2015 0455   CO2 26 03/17/2015 0455   GLUCOSE 88 03/17/2015 0455   BUN 7 03/17/2015 0455   CREATININE 0.79 03/17/2015 0455   CALCIUM 7.6* 03/17/2015 0455   GFRNONAA >60 03/17/2015 0455   GFRAA >60 03/17/2015 0455    COAG Lab Results  Component Value Date   INR 1.13 02/28/2015   No results found for:  PTT  Antibiotics Anti-infectives    Start     Dose/Rate Route Frequency Ordered Stop   03/19/15 1145  levofloxacin (LEVAQUIN) tablet 750 mg     750 mg Oral Daily 03/19/15 1142     03/19/15 1000  cefpodoxime (VANTIN) tablet 200 mg  Status:  Discontinued     200 mg Oral Every 12 hours 03/19/15 0755 03/19/15 0833   03/16/15 2200  cefUROXime (ZINACEF) 1.5 g in dextrose 5 % 50 mL IVPB     1.5 g 100 mL/hr over 30 Minutes Intravenous Every 12 hours 03/16/15 2128 03/17/15 0942   03/16/15 1445  cefUROXime (ZINACEF) 1.5 g in dextrose 5 % 50 mL IVPB  Status:  Discontinued     1.5 g 100 mL/hr over 30 Minutes Intravenous To Surgery 03/16/15 1443 03/16/15 1918       Virgina Jock, PA-C [facility] Office: [phone] Pager: [phone] 03/21/2015 8:44 AM     I have examined the patient, reviewed and agree with above. Patient asking for additional pain medications. Defer this to her pain management contract and she understands. Explain the importance of seeing her in the office next week. She reports that she has difficulty with transportation but will try to follow-up with Dr. Bridgett Larsson in one week  Curt Jews, MD 03/21/2015 10:43 AM

## 2015-03-24 NOTE — Discharge Summary (Signed)
[facility] Discharge Summary  Briana Mosley [birth year] 49 y.o. female  [id number]  Admission Date: 03/16/2015  Discharge Date: 03/21/15  Physician: Curt Jews, MD  Admission Diagnosis: Bleeding from right popliteal incision with disruption of below-knee anastomosis  HPI:   This is a 49 y.o. female who is 10 days status post right femoral to below-knee popliteal bypass with saphenous vein with Dr. Bridgett Larsson. She was discharged home. She called EMS today with severe bleeding from her left lower incision of the popliteal space. She was initially taken to [facility]. EMS reports great deal blood at the scene and also continued bleeding in their vehicle. She was hemodynamically stable at [facility] was transferred to [facility] for treatment. Patient is alert and oriented. She does report to moderate pain.   Hospital Course:  The patient was admitted to the hospital and taken to the operating room on 03/16/2015 and underwent: Exploration of below the knee popliteal anastimosis, repair of femoral popliteal anastimosis, evacuation of hematoma right.  The patient tolerated the procedure well and was transported to the PACU in stable condition. Her post-op hemoglobin was 6.1. She received 2 units of pRBCs.   On POD 1, she had doppler signals of her right dorsalis pedis, posterior tibial and peroneal arteries. Her acute blood loss anemia was stable after transfusion. She had some swelling of her right leg. Her incisions were intact. Her wound cultures were pending.   POD 2: Had some serous drainage from popliteal wound. Her foot was well perfused. Transferred to telemetry floor. Began mobilization. Foley was discontinued.   POD 3: Wound culture grew some pseudomonas. She was started on po levaquin. She continued to have marked edema of her right leg. Continued to have serous drainage from popliteal wound. Right groin with some minor separation.   POD 4, 5:  remained stable. Afebrile. Was discharged home on levaquin on POD 5 in good condition. Home health services (PT, OT, RN) were resumed. Patient asked about pain medication but was not prescribed any due pain medicine contract. She said she would not be able to follow up in one week due to transportation issues but was strongly encouraged to make the appointment. A two-week follow-up appointment was made as well.     CBC    Component Value Date/Time   WBC 7.3 03/18/2015 0510   RBC 3.09* 03/18/2015 0510   HGB 9.0* 03/18/2015 0510   HCT 26.7* 03/18/2015 0510   PLT 172 03/18/2015 0510   MCV 86.4 03/18/2015 0510   MCH 29.1 03/18/2015 0510   MCHC 33.7 03/18/2015 0510   RDW 14.6 03/18/2015 0510   LYMPHSABS 1.5 03/16/2015 1400   MONOABS 0.7 03/16/2015 1400   EOSABS 0.1 03/16/2015 1400   BASOSABS 0.0 03/16/2015 1400    BMET    Component Value Date/Time   NA 139 03/17/2015 0455   K 3.8 03/17/2015 0455   CL 107 03/17/2015 0455   CO2 26 03/17/2015 0455   GLUCOSE 88 03/17/2015 0455   BUN 7 03/17/2015 0455   CREATININE 0.79 03/17/2015 0455   CALCIUM 7.6* 03/17/2015 0455   GFRNONAA >60 03/17/2015 0455   GFRAA >60 03/17/2015 0455     Discharge Instructions:   The patient is discharged to home with extensive instructions on wound care and progressive ambulation.  They are instructed not to drive or perform any heavy lifting until returning to see the physician in his office.  Discharge Instructions    Call MD for:  redness,  tenderness, or signs of infection (pain, swelling, bleeding, redness, odor or green/yellow discharge around incision site)    Complete by:  As directed      Call MD for:  severe or increased pain, loss or decreased feeling  in affected limb(s)    Complete by:  As directed      Call MD for:  temperature >100.5    Complete by:  As directed      Discharge wound care:    Complete by:  As directed   Wash groin wounds and right leg wounds daily with soap and water. Apply  dry gauze or dry washcloth to right groin to wick moisture.     Driving Restrictions    Complete by:  As directed   No driving for 2 weeks     Increase activity slowly    Complete by:  As directed   Walk with assistance use walker or cane as needed     Lifting restrictions    Complete by:  As directed   No lifting for 2 weeks     Resume previous diet    Complete by:  As directed            Discharge Diagnosis:  Bleeding from right popliteal incision with disruption of below-knee anastomosis  Secondary Diagnosis: Patient Active Problem List   Diagnosis Date Noted  . PAD (peripheral artery disease) 03/16/2015  . Non-healing wound of lower extremity 03/06/2015  . Critical lower limb ischemia 02/21/2015  . Carotid artery occlusion 01/27/2015  . Carotid stenosis 01/27/2015  . Chronic venous insufficiency 01/27/2015  . Venous stasis ulcer of ankle 01/27/2015  . Hallucinations 11/29/2011  . Confusion 11/29/2011  . Hypertension 11/29/2011  . Back pain 11/29/2011   Past Medical History  Diagnosis Date  . Chronic back pain   . Hypertension   . Thyroid disease     hyperthyroidism  . Lower extremity weakness   . Anxiety   . GERD (gastroesophageal reflux disease)     otc med if needed       Medication List    TAKE these medications        ALPRAZolam 0.5 MG tablet  Commonly known as:  XANAX  Take 0.5 tablets (0.25 mg total) by mouth 3 (three) times daily as needed for anxiety.     aspirin EC 81 MG tablet  Take 1 tablet (81 mg total) by mouth daily.     baclofen 10 MG tablet  Commonly known as:  LIORESAL  Take 10 mg by mouth 4 (four) times daily.     clonazePAM 0.5 MG tablet  Commonly known as:  KLONOPIN  Take 0.5 mg by mouth 3 (three) times daily as needed for anxiety.     levofloxacin 750 MG tablet  Commonly known as:  LEVAQUIN  Take 1 tablet (750 mg total) by mouth daily.     magnesium oxide 400 MG tablet  Commonly known as:  MAG-OX  Take 400 mg by mouth  daily.     Oxycodone HCl 10 MG Tabs  Take 1 tablet (10 mg total) by mouth 4 (four) times daily as needed (pain).     simvastatin 10 MG tablet  Commonly known as:  ZOCOR  Take 1 tablet (10 mg total) by mouth daily.        No pain medication prescribed.   Disposition: Home  Patient's condition: is Good  Follow up: 1. Dr. Bridgett Larsson in one week.    Virgina Jock, PA-C [facility] [phone] 03/24/2015  9:48 AM  - For VQI Registry use --- Instructions: Press F2 to tab through selections.  Delete question if not applicable.   Post-op:  Wound infection: No  Graft infection: Yes  Transfusion: Yes  If yes, 2 units given New Arrhythmia: No Ipsilateral amputation: No, [ ]  Minor, [ ]  BKA, [ ]  AKA Discharge patency: [ x] Primary, [ ]  Primary assisted, [ ]  Secondary, [ ]  Occluded Patency judged by: [x]  Dopper only, [ ]  Palpable graft pulse, [ ]  Palpable distal pulse, [ ]  ABI inc. > 0.15, [ ]  Duplex Discharge ABI: R 0.76, L 0.47 D/C Ambulatory Status: Ambulatory with Assistance  Complications: MI: No, [ ]  Troponin only, [ ]  EKG or Clinical CHF: No Resp failure:No, [ ]  Pneumonia, [ ]  Ventilator Chg in renal function: No, [ ]  Inc. Cr > 0.5, [ ]  Temp. Dialysis, [ ]  Permanent dialysis Stroke: No, [ ]  Minor, [ ]  Major Return to OR: No  Reason for return to OR: [ ]  Bleeding, [ ]  Infection, [ ]  Thrombosis, [ ]  Revision  Discharge medications: Statin use:  yes ASA use:  yes Plavix use:  no Beta blocker use: no Coumadin use: no

## 2015-03-25 ENCOUNTER — Encounter: Payer: Self-pay | Admitting: Vascular Surgery

## 2015-03-25 ENCOUNTER — Telehealth: Payer: Self-pay | Admitting: Vascular Surgery

## 2015-03-25 NOTE — Telephone Encounter (Signed)
Patient refused the appointment for 06/24 [place] and [place] no other way to get here. I confirmed her appt for 04/04/2015. dpm

## 2015-03-25 NOTE — Telephone Encounter (Signed)
-----   Message from Mena Goes, RN sent at 03/21/2015 11:45 AM EDT ----- Regarding: Schedule   ----- Message -----    From: Alvia Grove, PA-C    Sent: 03/21/2015  10:31 AM      To: Vvs Charge Pool  S/p exploration of below knee popliteal anastomosis, repair of femoral popliteal anastomosis and evacuation of right below knee hematoma 03/16/15  Patient has appointment for 04/04/15 with Dr. Bridgett Larsson. She says she doesn't know if she can make it to appointment in one week due to transportation issues. Do prefer that she follows up next Friday 04/27/15. Please make appointment for next Friday. If she is unable to make it, she will follow up on 7/1.   Thanks Maudie Mercury

## 2015-03-28 ENCOUNTER — Encounter: Payer: Medicare Other | Admitting: Vascular Surgery

## 2015-04-01 NOTE — Anesthesia Postprocedure Evaluation (Signed)
  Anesthesia Post-op Note  Patient: Briana Mosley  Procedure(s) Performed: Procedure(s): Exploration of  (N/A) Exploration of below the knee popliteal anastimosis, repair of femoral popliteal anastimosis, evacuation of hematoma right. (Right)  Patient Location: PACU  Anesthesia Type:General  Level of Consciousness: awake, alert  and oriented  Airway and Oxygen Therapy: Patient Spontanous Breathing and Patient connected to nasal cannula oxygen  Post-op Pain: mild  Post-op Assessment: Post-op Vital signs reviewed, Patient's Cardiovascular Status Stable, Respiratory Function Stable, Patent Airway and Pain level controlled LLE Motor Response: Purposeful movement, Responds to commands LLE Sensation: Full sensation, No numbness, No tingling RLE Motor Response: Purposeful movement, Responds to commands RLE Sensation: Full sensation, No numbness, No tingling      Post-op Vital Signs: stable  Last Vitals:  Filed Vitals:   03/21/15 1414  BP: 106/60  Pulse: 107  Temp: 36.7 C  Resp: 18    Complications: No apparent anesthesia complications

## 2015-04-02 ENCOUNTER — Encounter: Payer: Self-pay | Admitting: Vascular Surgery

## 2015-04-04 ENCOUNTER — Ambulatory Visit (INDEPENDENT_AMBULATORY_CARE_PROVIDER_SITE_OTHER): Payer: Self-pay | Admitting: Vascular Surgery

## 2015-04-04 ENCOUNTER — Encounter: Payer: Self-pay | Admitting: Vascular Surgery

## 2015-04-04 VITALS — BP 149/83 | HR 95 | Ht 65.0 in | Wt 140.0 lb

## 2015-04-04 DIAGNOSIS — I998 Other disorder of circulatory system: Secondary | ICD-10-CM

## 2015-04-04 DIAGNOSIS — I70229 Atherosclerosis of native arteries of extremities with rest pain, unspecified extremity: Secondary | ICD-10-CM

## 2015-04-04 NOTE — Progress Notes (Signed)
    Postoperative Visit   History of Present Illness  Briana Mosley is a 49 y.o. year old female who presents for postoperative follow-up for:  1. R CFA to BK pop BPG w/ NR ips GSV (Date: 03/06/15).   2. Revision of popliteal anastomosis for bleeding (Date: 03/16/15).  R lateral shin ulcer is healing.  Pt notes some drainage from medial incision.  For VQI Use Only  PRE-ADM LIVING: Home  AMB STATUS: Ambulatory with Assistance  Physical Examination  Filed Vitals:   04/04/15 1457  BP: 149/83  Pulse: 95   RLE: lateral venous ulcer is healing and contracting with some bleeding (previously none), medial popliteal incision remains somewhat swollen, sutures in place, some serosang. Drainage, venous skin changes, some weeping  Medical Decision Making  Briana Mosley is a 49 y.o. year old female who presents s/p R CFA to BK pop bypass complicated with distal anastomotic disruption concerning for infection, baseline significant CVI, radicular sx related to DDD vs HNP . The venous ulcer healing justifies the procedure. I have concerns that the distal anastomotic disruption occurred due to a Pseudomonas infection which fortunately was pan-sensitive. Patient has finished her abx regimen.  Sutures were removed today.  Wet-to-dry dressing to lateral ulcer and medial incision.  Wrap foot in Kerlix and ACE bandage.  Once she heals, she will need thigh compression stocking on R foot to help managed her CVI (C6)  Follow up in 2 weeks for wound check   Briana Barthel, MD [facility] [place]: [phone] Pager: [phone]  04/04/2015, 3:20 PM

## 2015-04-22 ENCOUNTER — Encounter: Payer: Self-pay | Admitting: Vascular Surgery

## 2015-04-25 ENCOUNTER — Ambulatory Visit (INDEPENDENT_AMBULATORY_CARE_PROVIDER_SITE_OTHER): Payer: Self-pay | Admitting: Vascular Surgery

## 2015-04-25 ENCOUNTER — Encounter: Payer: Self-pay | Admitting: Vascular Surgery

## 2015-04-25 VITALS — BP 164/78 | HR 88 | Temp 98.4°F | Ht 65.0 in | Wt 140.0 lb

## 2015-04-25 DIAGNOSIS — I998 Other disorder of circulatory system: Secondary | ICD-10-CM

## 2015-04-25 DIAGNOSIS — I70229 Atherosclerosis of native arteries of extremities with rest pain, unspecified extremity: Secondary | ICD-10-CM

## 2015-04-25 DIAGNOSIS — Z48812 Encounter for surgical aftercare following surgery on the circulatory system: Secondary | ICD-10-CM

## 2015-04-25 NOTE — Progress Notes (Signed)
    Postoperative Visit   History of Present Illness  Briana Mosley is a 49 y.o. year old female who presents for continued follow-up for: 1. R CFA to BK pop BPG w/ NR ips GSV (Date: 03/06/15).  2. Revision of popliteal anastomosis for bleeding (Date: 03/16/15).  She was last seen on 04/04/15. Her right medial leg incision is now nearly healed. Her right lateral shin ulcer is completely healed. She notes some improvement in her right leg swelling. Her ambulation is improving. She denies any leg pain.   For VQI Use Only  PRE-ADM LIVING: Home  AMB STATUS: Ambulatory with Assistance  Physical Examination  Filed Vitals:   04/25/15 1406  BP: 164/78  Pulse: 88  Temp: 98.4 F (36.9 C)   RLE: Right medial leg incision dry and nearly healed. Other incisions well-healed. Right lateral shin ulcer healed. Foot is warm and well perfused. Moderate edema.   Medical Decision Making  Briana Mosley is a 49 y.o. year old female who presents s/p R CFA to BK pop bypass complicated with distal anastomotic disruption concerning for infection, baseline significant CVI, radicular sx related to DDD vs HNP   The patient's right lateral shin ulcer is now healed. Her medial leg incision is also nearly healed.  Her ambulation is improving.  Start compression therapy to right lower leg to assist with swelling.   No further need for home health wound care.  Follow up in three months with RLE bypass duplex and ABIs.   The patient still has chronic back pain. Advised patient to ask PCP for referral to spine surgeon once she recovers from this current surgical course.   Virgina Jock, PA-C [facility] [place] Office: [phone]  04/25/2015, 2:28 PM  This patient was seen and examined in conjunction with Dr. Bridgett Larsson.   Addendum  I have independently interviewed and examined the patient, and I agree with the physician assistant's findings.  Venous ulcers are healed  now.  R pop incision is nearly healed.  Suggested OTC compression stocking to R leg to help with swelling due to use if GSV for bypass.  Follow up in 3 months for surveillance studies.  Adele Barthel, MD [facility] [place] Office: [phone] Pager: [phone]  04/25/2015, 5:18 PM

## 2015-07-02 LAB — NM MYOCAR MULTI W/SPECT W/WALL MOTION / EF
CHL CUP NUCLEAR SDS: 6
LHR: 0
LV sys vol: 20 mL
LVDIAVOL: 59 mL
NUC STRESS EF: 65 %
NUC STRESS TID: 1
SRS: 4
SSS: 10

## 2015-08-01 ENCOUNTER — Other Ambulatory Visit (HOSPITAL_COMMUNITY): Payer: Medicare Other

## 2015-08-01 ENCOUNTER — Ambulatory Visit: Payer: Medicare Other | Admitting: Vascular Surgery

## 2015-08-01 ENCOUNTER — Encounter (HOSPITAL_COMMUNITY): Payer: Medicare Other

## 2015-10-10 ENCOUNTER — Encounter: Payer: Self-pay | Admitting: Vascular Surgery

## 2015-10-14 DIAGNOSIS — M545 Low back pain: Secondary | ICD-10-CM | POA: Diagnosis not present

## 2015-10-14 DIAGNOSIS — Z418 Encounter for other procedures for purposes other than remedying health state: Secondary | ICD-10-CM | POA: Diagnosis not present

## 2015-10-14 DIAGNOSIS — F419 Anxiety disorder, unspecified: Secondary | ICD-10-CM | POA: Diagnosis not present

## 2015-10-14 DIAGNOSIS — G8389 Other specified paralytic syndromes: Secondary | ICD-10-CM | POA: Diagnosis not present

## 2015-10-14 DIAGNOSIS — F329 Major depressive disorder, single episode, unspecified: Secondary | ICD-10-CM | POA: Diagnosis not present

## 2015-10-17 ENCOUNTER — Ambulatory Visit: Payer: Medicare Other | Admitting: Vascular Surgery

## 2015-10-17 ENCOUNTER — Encounter (HOSPITAL_COMMUNITY): Payer: Medicare Other

## 2015-10-17 ENCOUNTER — Other Ambulatory Visit (HOSPITAL_COMMUNITY): Payer: Medicare Other

## 2015-11-27 ENCOUNTER — Encounter: Payer: Self-pay | Admitting: Vascular Surgery

## 2015-12-01 DIAGNOSIS — R05 Cough: Secondary | ICD-10-CM | POA: Diagnosis not present

## 2015-12-01 DIAGNOSIS — Z79899 Other long term (current) drug therapy: Secondary | ICD-10-CM | POA: Diagnosis not present

## 2015-12-01 DIAGNOSIS — F17210 Nicotine dependence, cigarettes, uncomplicated: Secondary | ICD-10-CM | POA: Diagnosis not present

## 2015-12-01 DIAGNOSIS — J111 Influenza due to unidentified influenza virus with other respiratory manifestations: Secondary | ICD-10-CM | POA: Diagnosis not present

## 2015-12-04 ENCOUNTER — Encounter: Payer: Self-pay | Admitting: Vascular Surgery

## 2015-12-05 ENCOUNTER — Inpatient Hospital Stay (INDEPENDENT_AMBULATORY_CARE_PROVIDER_SITE_OTHER)
Admission: RE | Admit: 2015-12-05 | Discharge: 2015-12-05 | Disposition: A | Discharge status: Home / Self Care | Payer: Medicare Other | Source: Ambulatory Visit | Attending: Vascular Surgery | Admitting: Vascular Surgery

## 2015-12-05 ENCOUNTER — Ambulatory Visit: Payer: Medicare Other | Admitting: Vascular Surgery

## 2015-12-05 DIAGNOSIS — Z48812 Encounter for surgical aftercare following surgery on the circulatory system: Secondary | ICD-10-CM | POA: Diagnosis not present

## 2015-12-05 DIAGNOSIS — I998 Other disorder of circulatory system: Secondary | ICD-10-CM

## 2015-12-12 DIAGNOSIS — M545 Low back pain: Secondary | ICD-10-CM | POA: Diagnosis not present

## 2015-12-12 DIAGNOSIS — G8389 Other specified paralytic syndromes: Secondary | ICD-10-CM | POA: Diagnosis not present

## 2015-12-12 DIAGNOSIS — I739 Peripheral vascular disease, unspecified: Secondary | ICD-10-CM | POA: Diagnosis not present

## 2016-01-03 DIAGNOSIS — I70201 Unspecified atherosclerosis of native arteries of extremities, right leg: Secondary | ICD-10-CM

## 2016-01-03 HISTORY — DX: Unspecified atherosclerosis of native arteries of extremities, right leg: I70.201

## 2016-01-09 ENCOUNTER — Ambulatory Visit (INDEPENDENT_AMBULATORY_CARE_PROVIDER_SITE_OTHER): Payer: Medicare Other | Admitting: Family

## 2016-01-09 ENCOUNTER — Ambulatory Visit (HOSPITAL_COMMUNITY)
Admission: RE | Admit: 2016-01-09 | Discharge: 2016-01-09 | Disposition: A | Discharge status: Home / Self Care | Payer: Medicare Other | Source: Ambulatory Visit | Attending: Vascular Surgery | Admitting: Vascular Surgery

## 2016-01-09 ENCOUNTER — Ambulatory Visit: Payer: Medicare Other | Admitting: Vascular Surgery

## 2016-01-09 ENCOUNTER — Encounter: Payer: Self-pay | Admitting: Family

## 2016-01-09 VITALS — BP 106/69 | HR 83 | Temp 98.3°F | Ht 65.0 in | Wt 140.0 lb

## 2016-01-09 DIAGNOSIS — I998 Other disorder of circulatory system: Secondary | ICD-10-CM

## 2016-01-09 DIAGNOSIS — Z720 Tobacco use: Secondary | ICD-10-CM

## 2016-01-09 DIAGNOSIS — T82898A Other specified complication of vascular prosthetic devices, implants and grafts, initial encounter: Secondary | ICD-10-CM

## 2016-01-09 DIAGNOSIS — F17200 Nicotine dependence, unspecified, uncomplicated: Secondary | ICD-10-CM

## 2016-01-09 DIAGNOSIS — I6522 Occlusion and stenosis of left carotid artery: Secondary | ICD-10-CM

## 2016-01-09 DIAGNOSIS — Z48812 Encounter for surgical aftercare following surgery on the circulatory system: Secondary | ICD-10-CM | POA: Insufficient documentation

## 2016-01-09 DIAGNOSIS — Z95828 Presence of other vascular implants and grafts: Secondary | ICD-10-CM | POA: Insufficient documentation

## 2016-01-09 DIAGNOSIS — E119 Type 2 diabetes mellitus without complications: Secondary | ICD-10-CM | POA: Diagnosis not present

## 2016-01-09 NOTE — Progress Notes (Signed)
[facility] [place] HISTORY AND PHYSICAL -PAD  History of Present Illness Briana Mosley is a 50 y.o. female patient of Dr. Bridgett Larsson who is s/p R CFA to BK pop BPG w/ NR ips GSV (Date: 03/06/15) and Revision of popliteal anastomosis for bleeding (Date: 03/16/15).  She was last seen on 04/25/15. Her right medial leg incision was nearly healed. Her right lateral shin ulcer was completely healed. She notes some improvement in her right leg swelling. Her ambulation was improving. She denied any leg pain.  Venous ulcers had healed. R pop incision was nearly healed. Suggested OTC compression stocking to R leg to help with swelling due to use if GSV for bypass. Pt was to return in 3 months for surveillance, but she returns today with an occluded graft as found on right leg arterial duplex today. Pt states her son died around the time she was to return.  Pt states swelling and redness started 1-2 weeks ago in her lower right leg and foot, pt's niece states this is worsening. Pt does not seem to have any pain in this area, states the jumping in her right leg keeps her awake for years. She is not taking any antibx.  Pt states she was scheduled to see Dr. Bridgett Larsson today but Dr. Bridgett Larsson is not in the office today.  Pt states she has a pinched nerve in her back and 3 bulging discs, states she has a hx of 3 back surgeries. Both feet feel numb, right worse than left, numbness is no worse lately, she has had this for years.    Pt Diabetic: No Pt smoker: smoker  (1 ppd, started in her 20's)  Pt meds include: Statin :No Betablocker: No ASA: Yes Other anticoagulants/antiplatelets: no  Past Medical History  Diagnosis Date  . Chronic back pain   . Hypertension   . Thyroid disease     hyperthyroidism  . Lower extremity weakness   . Anxiety   . GERD (gastroesophageal reflux disease)     otc med if needed    Social History Social History  Substance Use Topics  . Smoking status:  Current Every Day Smoker -- 1.00 packs/day for 30 years    Types: Cigarettes  . Smokeless tobacco: Never Used  . Alcohol Use: No    Family History Family History  Problem Relation Age of Onset  . Cancer Mother   . Heart disease Father   . Diabetes Sister     Past Surgical History  Procedure Laterality Date  . Cholecystectomy    . Aortogram N/A 02/24/2015    Procedure: AORTOGRAM WITH BILATERAL LOWER EXTREMITY RUNOFF;  Surgeon: Conrad [place], MD;  Location: [facility];  Service: Vascular;  Laterality: N/A;  . Cardiac catheterization N/A 02/28/2015    Procedure: Left Heart Cath and Coronary Angiography;  Surgeon: Adrian Prows, MD;  Location: [facility];  Service: Cardiovascular;  Laterality: N/A;  . Femoral-popliteal bypass graft Right 03/06/2015    Procedure: RIGHT LEG FEMORAL-BELOW KNEE POPLITEAL ARTERY;  Surgeon: Conrad [place], MD;  Location: [facility];  Service: Vascular;  Laterality: Right;  . Femoral-popliteal bypass graft N/A 03/16/2015    Procedure: Exploration of ;  Surgeon: Rosetta Posner, MD;  Location: [facility];  Service: Vascular;  Laterality: N/A;  . Femoral artery exploration Right 03/16/2015    Procedure: Exploration of below the knee popliteal anastimosis, repair of femoral popliteal anastimosis, evacuation of hematoma right.;  Surgeon: Rosetta Posner, MD;  Location:  [facility] OR;  Service: Vascular;  Laterality: Right;  . Thoracic laminectomy  04/2010    w/tumor resetion/notes 04/19/2010  . Back surgery      Allergies  Allergen Reactions  . Bactrim   . Neurontin [Gabapentin]   . Tramadol Nausea Only  . Ciprofloxacin Other (See Comments)    hallucination  . Sulfa Antibiotics Rash    Current Outpatient Prescriptions  Medication Sig Dispense Refill  . ALPRAZolam (XANAX) 0.5 MG tablet Take 0.5 tablets (0.25 mg total) by mouth 3 (three) times daily as needed for anxiety. (Patient not taking: Reported on 01/27/2015)    . amoxicillin (AMOXIL) 500 MG capsule     . aspirin EC 81 MG tablet Take  1 tablet (81 mg total) by mouth daily.    . baclofen (LIORESAL) 10 MG tablet Take 10 mg by mouth 4 (four) times daily.    . clonazePAM (KLONOPIN) 0.5 MG tablet Take 0.5 mg by mouth 3 (three) times daily as needed for anxiety.    . furosemide (LASIX) 40 MG tablet     . levofloxacin (LEVAQUIN) 750 MG tablet Take 1 tablet (750 mg total) by mouth daily. 14 tablet 0  . magnesium oxide (MAG-OX) 400 MG tablet Take 400 mg by mouth daily.    . Oxycodone HCl 10 MG TABS Take 1 tablet (10 mg total) by mouth 4 (four) times daily as needed (pain). 30 tablet 0  . potassium chloride (K-DUR,KLOR-CON) 10 MEQ tablet     . simvastatin (ZOCOR) 10 MG tablet Take 1 tablet (10 mg total) by mouth daily. 30 tablet 11   No current facility-administered medications for this visit.    ROS: See HPI for pertinent positives and negatives.   Physical Examination  Filed Vitals:   01/09/16 1507  BP: 106/69  Pulse: 83  Temp: 98.3 F (36.8 C)  TempSrc: Oral  Height: 5\' 5"  (1.651 m)  Weight: 140 lb (63.504 kg)  SpO2: 100%   Body mass index is 23.3 kg/(m^2).  General: A&O x 3, WDWN. Gait: seated in wheelchair Eyes: Pupils are equal Pulmonary: respirations are non labored, rare wheezes , no rales or rhonchi. Cardiac: regular rhythm, no detected murmur.         Carotid Bruits Right Left   Negative Positive  Aorta is not palpable. Radial pulses: 2+ palpable and =                           VASCULAR EXAM: Extremities with ischemic changes: dependent rubor in both feet and lower legs that fades with elevation of feet, right more so than left without Gangrene; without open wounds. Right toes with mild cyanosis.                                                                                                          LE Pulses Right Left       FEMORAL   palpable   palpable        POPLITEAL  not palpable   not palpable  POSTERIOR TIBIAL  not palpable   not palpable        DORSALIS PEDIS      ANTERIOR  TIBIAL not palpable  not palpable    Abdomen: soft, NT, no palpable masses. Skin: no rashes, no ulcers, see Extremities. Musculoskeletal: no muscle wasting or atrophy.  Neurologic: A&O X 3; Appropriate Affect ; SENSATION: normal; MOTOR FUNCTION:  moving all extremities equally, motor strength 5/5 throughout. Speech is fluent/normal.  CN 2-12 intact.    Non-Invasive Vascular Imaging: DATE: 01/09/2016  ABI: RIGHT: 0.20 (0.76, 03/17/15 at [facility]), Waveforms: monophasic PT, DP and peroneal not detected;  LEFT: 0.58 (0.47), Waveforms: monophasic  Right LE arterial Duplex: Incidental note: the right femoral popliteal venous system is compressible. No flow adequately detected or visualized within the right leg bypass graft. No previous duplex exam at this facility for comparison.     ASSESSMENT: Briana Mosley is a 50 y.o. female who is s/p R CFA to BK pop BPG w/ NR ips GSV (Date: 03/06/15) and Revision of popliteal anastomosis for bleeding (Date: 03/16/15). She returns today with a 1-2 week hx of reported swelling and redness of right lower leg and foot.  She has dependent rubor in both lower legs and feet that fads with elevation of legs, right more so than left. No edema in her lower legs or feet. Toes of right foot are slightly cyanotic which fades with elevation.  No ulcers, no gangrene. She denies pain in her feet or legs, she reports continued numbness in her feet for years. Pt states she has a pinched nerve in her back and 3 bulging discs, states she has a hx of 3 back surgeries.   Right LE arterial duplex today indicates no flow within the right leg bypass graft. No previous duplex exam at this facility for comparison. Incidental note: the right femoral popliteal venous system is compressible.  Right ABI has decreased from moderate arterial occlusive disease to critical limb ischemia since June of 2016, left ABI has improved from 47% to 58% but remains in the severe arterial occlusive  disease category.  Fortunately she does not have DM and takes a daily ASA. Unfortunately she smokes a ppd and does not take prescribed statin as she states she ran out after the original prescription.    Left carotid bruit present, pt denies any hx of stroke or TIA.  Carotid duplex on 01/27/15 (review of records) indicates 1-39% right ICA stenosis and occluded left ICA.    PLAN:  Based on the patient's vascular studies and examination, and after discussing with Dr. Scot Dock, pt will be scheduled to see Dr. Bridgett Larsson as soon as he returns from out of town which will be in 2 weeks. If pt feels her symptoms worsen, then I advised her to get to [facility] ED for an evaluation.  The patient was counseled re smoking cessation and given several free resources re smoking cessation.  I discussed in depth with the patient the nature of atherosclerosis, and emphasized the importance of maximal medical management including strict control of blood pressure, blood glucose, and lipid levels, obtaining regular exercise, and cessation of smoking.  The patient is aware that without maximal medical management the underlying atherosclerotic disease process will progress, limiting the benefit of any interventions.  The patient was given information about PAD including signs, symptoms, treatment, what symptoms should prompt the patient to seek immediate medical care, and risk reduction measures to take.  Vinnie Level Asako Saliba, RN, MSN, FNP-C [facility] [employer] Phone: [phone]  Clinic MD: Scot Dock on call  01/09/2016 3:35 PM

## 2016-01-14 ENCOUNTER — Encounter: Payer: Self-pay | Admitting: Vascular Surgery

## 2016-01-22 NOTE — Progress Notes (Addendum)
Established Previous Bypass   History of Present Illness  Briana Mosley is a 50 y.o. (Jul 09, 1966) female who presents with chief complaint: pain in right foot and lower leg.  Previous operation(s) include:  1. R CFA to BK pop BPG w/ NR ips GSV (Date: 03/06/15).  2. Revision of popliteal anastomosis for bleeding (Date: 03/16/15).  This patient failed to show for her last schedule appointment due to a family death.  ~3 week ago she has sudden worsening in discoloration in her right foot.  Since then she has had significant increase in pain in right foot with swelling.  The patient's symptoms are: rest pain. The patient's treatment regimen currently included: maximal medical management.  Past Medical History  Diagnosis Date  . Chronic back pain   . Hypertension   . Thyroid disease     hyperthyroidism  . Lower extremity weakness   . Anxiety   . GERD (gastroesophageal reflux disease)     otc med if needed    Past Surgical History  Procedure Laterality Date  . Cholecystectomy    . Aortogram N/A 02/24/2015    Procedure: AORTOGRAM WITH BILATERAL LOWER EXTREMITY RUNOFF;  Surgeon: Conrad Purcell, MD;  Location: [place];  Service: Vascular;  Laterality: N/A;  . Cardiac catheterization N/A 02/28/2015    Procedure: Left Heart Cath and Coronary Angiography;  Surgeon: Adrian Prows, MD;  Location: [place] [facility];  Service: Cardiovascular;  Laterality: N/A;  . Femoral-popliteal bypass graft Right 03/06/2015    Procedure: RIGHT LEG FEMORAL-BELOW KNEE POPLITEAL ARTERY;  Surgeon: Conrad [place]ville, MD;  Location: [place];  Service: Vascular;  Laterality: Right;  . Femoral-popliteal bypass graft N/A 03/16/2015    Procedure: Exploration of ;  Surgeon: Rosetta Posner, MD;  Location: [facility] OR;  Service: Vascular;  Laterality: N/A;  . Femoral artery exploration Right 03/16/2015    Procedure: Exploration of below the knee popliteal anastimosis, repair of femoral popliteal anastimosis, evacuation of hematoma right.;   Surgeon: Rosetta Posner, MD;  Location: [place];  Service: Vascular;  Laterality: Right;  . Thoracic laminectomy  04/2010    w/tumor resetion/notes 04/19/2010  . Back surgery      Social History   Social History  . Marital Status: Legally Separated    Spouse Name: N/A  . Number of Children: N/A  . Years of Education: N/A   Occupational History  . Not on file.   Social History Main Topics  . Smoking status: Current Every Day Smoker -- 1.00 packs/day for 30 years    Types: Cigarettes  . Smokeless tobacco: Never Used  . Alcohol Use: No  . Drug Use: No  . Sexual Activity: Not on file   Other Topics Concern  . Not on file   Social History Narrative    Family History  Problem Relation Age of Onset  . Cancer Mother   . Heart disease Father   . Diabetes Sister     Current Outpatient Prescriptions  Medication Sig Dispense Refill  . ALPRAZolam (XANAX) 0.5 MG tablet Take 0.5 tablets (0.25 mg total) by mouth 3 (three) times daily as needed for anxiety.    Marland Kitchen aspirin EC 81 MG tablet Take 1 tablet (81 mg total) by mouth daily.    . clonazePAM (KLONOPIN) 0.5 MG tablet Take 0.5 mg by mouth 3 (three) times daily as needed for anxiety.    . magnesium oxide (MAG-OX) 400 MG tablet Take 400 mg by mouth daily.    Marland Kitchen  Oxycodone HCl 10 MG TABS Take 1 tablet (10 mg total) by mouth 4 (four) times daily as needed (pain). 30 tablet 0  . simvastatin (ZOCOR) 10 MG tablet Take 1 tablet (10 mg total) by mouth daily. (Patient taking differently: Take 40 mg by mouth daily. Per patients niece she is now taking 40 mg orally) 30 tablet 11  . amoxicillin (AMOXIL) 500 MG capsule Reported on 01/23/2016    . baclofen (LIORESAL) 10 MG tablet Take 10 mg by mouth 4 (four) times daily. Reported on 01/23/2016    . furosemide (LASIX) 40 MG tablet Reported on 01/23/2016    . levofloxacin (LEVAQUIN) 750 MG tablet Take 1 tablet (750 mg total) by mouth daily. (Patient not taking: Reported on 01/23/2016) 14 tablet 0  . potassium  chloride (K-DUR,KLOR-CON) 10 MEQ tablet Reported on 01/23/2016     No current facility-administered medications for this visit.     Allergies  Allergen Reactions  . Bactrim   . Neurontin [Gabapentin]   . Tramadol Nausea Only  . Ciprofloxacin Other (See Comments)    hallucination  . Sulfa Antibiotics Rash     REVIEW OF SYSTEMS:  (Positives checked otherwise negative)  CARDIOVASCULAR:   [ ]  chest pain,  [ ]  chest pressure,  [ ]  palpitations,  [ ]  shortness of breath when laying flat,  [ ]  shortness of breath with exertion,   [x]  pain in feet when walking,  [x]  pain in feet when laying flat, [ ]  history of blood clot in veins (DVT),  [ ]  history of phlebitis,  [x]  swelling in legs,  [ ]  varicose veins  PULMONARY:   [ ]  productive cough,  [ ]  asthma,  [ ]  wheezing  NEUROLOGIC:   [ ]  weakness in arms or legs,  [ ]  numbness in arms or legs,  [ ]  difficulty speaking or slurred speech,  [ ]  temporary loss of vision in one eye,  [ ]  dizziness  HEMATOLOGIC:   [ ]  bleeding problems,  [ ]  problems with blood clotting too easily  MUSCULOSKEL:   [ ]  joint pain, [ ]  joint swelling  GASTROINTEST:   [ ]  vomiting blood,  [ ]  blood in stool     GENITOURINARY:   [ ]  burning with urination,  [ ]  blood in urine  PSYCHIATRIC:   [ ]  history of major depression  INTEGUMENTARY:   [ ]  rashes,  [ ]  ulcers  CONSTITUTIONAL:   [ ]  fever,  [ ]  chills     Physical Examination  Vitals  Temp 97.1 F, 108/61, HR 68  General: A&O x 3, WD, distress evident  Pulmonary: Sym exp, good air movt, CTAB, no rales, rhonchi, & wheezing  Cardiac: RRR, Nl S1, S2, no Murmurs, rubs or gallops  Vascular: Vessel Right Left  Radial Palpable Palpable  Brachial Palpable Palpable  Carotid Palpable, without bruit Palpable, without bruit  Aorta Not palpable N/A  Femoral Palpable Palpable  Popliteal Not palpable Not palpable  PT Not Palpable Not Palpable  DP Not Palpable Not Palpable    Gastrointestinal: soft, NTND, no G/R, no HSM, no masses, no CVAT B  Musculoskeletal: M/S 5/5 throughout , R lower leg swollen, cyanosis in right foot not dependent, tenderness to palpation of plantar surface, ischemic skin changes in plantar surface  Neurologic: Pain and light touch intact in extremities , Motor exam as listed above   Non-Invasive Vascular Imaging ABI (Date: 01/09/16)  R:   ABI: 0.20 (0.76),   DP: ND,  PT: mono,   TBI: ND  L:   ABI: 0.58 (0.47),   DP: mono,   PT: mono,   TBI: ND  Bypass Duplex (Date: 01/09/16)  Occluded bypass   Medical Decision Making  Briana Mosley is a 50 y.o. 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  Her foot demonstrates clear signs of ischemia.  I recommended: repeat R leg angiogram to determine remaining revascularization options.  She is scheduled for this coming Monday 24th April  She will likely need a repeat R CFA to BK pop bypass vs R CFA to tibial artery bypass depending on the available distal target.  I discussed in depth with the patient the nature of atherosclerosis, and emphasized the importance of maximal medical management including strict control of blood pressure, blood glucose, and lipid levels, obtaining regular exercise, and cessation of smoking.    The patient is aware that without maximal medical management the underlying atherosclerotic disease process will progress, limiting the benefit of any interventions.  I discussed in depth with the patient the need for long term surveillance to improve the primary assisted patency of his bypass.  The patient agrees to cooperate with such. The patient is currently on a statin: Zocor.  The patient is currently on an anti-platelet: ASA.  Thank you for allowing [place] to participate in this patient's care.   Adele Barthel, MD [facility]  [place] Office: [phone] Pager: [phone]  01/22/2016, 10:01 PM

## 2016-01-23 ENCOUNTER — Ambulatory Visit (INDEPENDENT_AMBULATORY_CARE_PROVIDER_SITE_OTHER): Payer: Medicare Other | Admitting: Vascular Surgery

## 2016-01-23 ENCOUNTER — Other Ambulatory Visit: Payer: Self-pay

## 2016-01-23 ENCOUNTER — Encounter: Payer: Self-pay | Admitting: Vascular Surgery

## 2016-01-23 DIAGNOSIS — I6522 Occlusion and stenosis of left carotid artery: Secondary | ICD-10-CM | POA: Diagnosis not present

## 2016-01-23 DIAGNOSIS — I70229 Atherosclerosis of native arteries of extremities with rest pain, unspecified extremity: Secondary | ICD-10-CM

## 2016-01-23 DIAGNOSIS — L97319 Non-pressure chronic ulcer of right ankle with unspecified severity: Principal | ICD-10-CM

## 2016-01-23 DIAGNOSIS — I83013 Varicose veins of right lower extremity with ulcer of ankle: Secondary | ICD-10-CM | POA: Diagnosis not present

## 2016-01-23 DIAGNOSIS — I998 Other disorder of circulatory system: Secondary | ICD-10-CM

## 2016-01-23 NOTE — Progress Notes (Signed)
Pre-op instructions left on pt voicemail at # [phone] according to pre-op checklist. Pt made aware to stop vitamins, fish oil, herbal medications and NSAID's.

## 2016-01-26 ENCOUNTER — Inpatient Hospital Stay (HOSPITAL_COMMUNITY): Payer: Medicare Other

## 2016-01-26 ENCOUNTER — Ambulatory Visit (HOSPITAL_COMMUNITY): Admission: RE | Admit: 2016-01-26 | Payer: Medicare Other | Source: Ambulatory Visit | Admitting: Vascular Surgery

## 2016-01-26 ENCOUNTER — Inpatient Hospital Stay (HOSPITAL_COMMUNITY): Payer: Medicare Other | Admitting: Anesthesiology

## 2016-01-26 ENCOUNTER — Encounter (HOSPITAL_COMMUNITY): Payer: Self-pay | Admitting: Anesthesiology

## 2016-01-26 ENCOUNTER — Encounter (HOSPITAL_COMMUNITY): Admission: RE | Payer: Self-pay | Source: Ambulatory Visit

## 2016-01-26 ENCOUNTER — Encounter (HOSPITAL_COMMUNITY)
Admission: RE | Disposition: A | Discharge status: Home Health | Payer: Self-pay | Source: Ambulatory Visit | Attending: Vascular Surgery

## 2016-01-26 ENCOUNTER — Inpatient Hospital Stay (HOSPITAL_COMMUNITY)
Admission: RE | Admit: 2016-01-26 | Discharge: 2016-01-30 | DRG: 240 | Disposition: A | Discharge status: Home Health | Payer: Medicare Other | Source: Ambulatory Visit | Attending: Vascular Surgery | Admitting: Vascular Surgery

## 2016-01-26 DIAGNOSIS — I70239 Atherosclerosis of native arteries of right leg with ulceration of unspecified site: Secondary | ICD-10-CM | POA: Diagnosis present

## 2016-01-26 DIAGNOSIS — G8929 Other chronic pain: Secondary | ICD-10-CM | POA: Diagnosis present

## 2016-01-26 DIAGNOSIS — M549 Dorsalgia, unspecified: Secondary | ICD-10-CM | POA: Diagnosis present

## 2016-01-26 DIAGNOSIS — M6289 Other specified disorders of muscle: Secondary | ICD-10-CM | POA: Diagnosis not present

## 2016-01-26 DIAGNOSIS — E0590 Thyrotoxicosis, unspecified without thyrotoxic crisis or storm: Secondary | ICD-10-CM | POA: Diagnosis present

## 2016-01-26 DIAGNOSIS — Z7982 Long term (current) use of aspirin: Secondary | ICD-10-CM | POA: Diagnosis not present

## 2016-01-26 DIAGNOSIS — T82392A Other mechanical complication of femoral arterial graft (bypass), initial encounter: Secondary | ICD-10-CM | POA: Diagnosis not present

## 2016-01-26 DIAGNOSIS — I10 Essential (primary) hypertension: Secondary | ICD-10-CM | POA: Diagnosis not present

## 2016-01-26 DIAGNOSIS — T82868A Thrombosis of vascular prosthetic devices, implants and grafts, initial encounter: Secondary | ICD-10-CM | POA: Diagnosis not present

## 2016-01-26 DIAGNOSIS — L98499 Non-pressure chronic ulcer of skin of other sites with unspecified severity: Secondary | ICD-10-CM | POA: Diagnosis present

## 2016-01-26 DIAGNOSIS — Z79899 Other long term (current) drug therapy: Secondary | ICD-10-CM | POA: Diagnosis not present

## 2016-01-26 DIAGNOSIS — I70209 Unspecified atherosclerosis of native arteries of extremities, unspecified extremity: Secondary | ICD-10-CM | POA: Diagnosis present

## 2016-01-26 DIAGNOSIS — Z419 Encounter for procedure for purposes other than remedying health state, unspecified: Secondary | ICD-10-CM

## 2016-01-26 DIAGNOSIS — Y832 Surgical operation with anastomosis, bypass or graft as the cause of abnormal reaction of the patient, or of later complication, without mention of misadventure at the time of the procedure: Secondary | ICD-10-CM | POA: Diagnosis present

## 2016-01-26 DIAGNOSIS — M79671 Pain in right foot: Secondary | ICD-10-CM | POA: Diagnosis not present

## 2016-01-26 DIAGNOSIS — K219 Gastro-esophageal reflux disease without esophagitis: Secondary | ICD-10-CM | POA: Diagnosis present

## 2016-01-26 DIAGNOSIS — F419 Anxiety disorder, unspecified: Secondary | ICD-10-CM | POA: Diagnosis present

## 2016-01-26 DIAGNOSIS — F17210 Nicotine dependence, cigarettes, uncomplicated: Secondary | ICD-10-CM | POA: Diagnosis present

## 2016-01-26 DIAGNOSIS — N390 Urinary tract infection, site not specified: Secondary | ICD-10-CM | POA: Diagnosis not present

## 2016-01-26 DIAGNOSIS — D62 Acute posthemorrhagic anemia: Secondary | ICD-10-CM | POA: Diagnosis not present

## 2016-01-26 DIAGNOSIS — I70238 Atherosclerosis of native arteries of right leg with ulceration of other part of lower right leg: Secondary | ICD-10-CM | POA: Diagnosis not present

## 2016-01-26 DIAGNOSIS — I70221 Atherosclerosis of native arteries of extremities with rest pain, right leg: Secondary | ICD-10-CM | POA: Diagnosis not present

## 2016-01-26 DIAGNOSIS — L97819 Non-pressure chronic ulcer of other part of right lower leg with unspecified severity: Secondary | ICD-10-CM | POA: Diagnosis not present

## 2016-01-26 DIAGNOSIS — Z89611 Acquired absence of right leg above knee: Secondary | ICD-10-CM | POA: Diagnosis not present

## 2016-01-26 HISTORY — PX: AORTOGRAM: SHX6300

## 2016-01-26 HISTORY — DX: Unspecified atherosclerosis of native arteries of extremities, right leg: I70.201

## 2016-01-26 LAB — POCT I-STAT, CHEM 8
BUN: 17 mg/dL (ref 6–20)
CALCIUM ION: 1.2 mmol/L (ref 1.12–1.23)
CHLORIDE: 100 mmol/L — AB (ref 101–111)
Creatinine, Ser: 0.8 mg/dL (ref 0.44–1.00)
Glucose, Bld: 78 mg/dL (ref 65–99)
HEMATOCRIT: 46 % (ref 36.0–46.0)
Hemoglobin: 15.6 g/dL — ABNORMAL HIGH (ref 12.0–15.0)
Potassium: 4.4 mmol/L (ref 3.5–5.1)
SODIUM: 142 mmol/L (ref 135–145)
TCO2: 31 mmol/L (ref 0–100)

## 2016-01-26 LAB — CBC
HCT: 37.1 % (ref 36.0–46.0)
Hemoglobin: 12.1 g/dL (ref 12.0–15.0)
MCH: 29.4 pg (ref 26.0–34.0)
MCHC: 32.6 g/dL (ref 30.0–36.0)
MCV: 90 fL (ref 78.0–100.0)
Platelets: 151 10*3/uL (ref 150–400)
RBC: 4.12 MIL/uL (ref 3.87–5.11)
RDW: 13.3 % (ref 11.5–15.5)
WBC: 6.3 10*3/uL (ref 4.0–10.5)

## 2016-01-26 LAB — CREATININE, SERUM
CREATININE: 0.61 mg/dL (ref 0.44–1.00)
GFR calc Af Amer: >60 mL/min (ref >60)

## 2016-01-26 SURGERY — ABDOMINAL AORTOGRAM
Anesthesia: LOCAL

## 2016-01-26 SURGERY — AORTOGRAM
Anesthesia: Monitor Anesthesia Care | Site: Groin

## 2016-01-26 SURGERY — ABDOMINAL AORTOGRAM W/LOWER EXTREMITY
Anesthesia: LOCAL

## 2016-01-26 MED ORDER — FENTANYL CITRATE (PF) 250 MCG/5ML IJ SOLN
INTRAMUSCULAR | Status: DC | PRN
  Administered 2016-01-26 (×7): 50 ug via INTRAVENOUS

## 2016-01-26 MED ORDER — HYDRALAZINE HCL 20 MG/ML IJ SOLN: 5.0000 mg | INTRAMUSCULAR | Status: DC | PRN

## 2016-01-26 MED ORDER — SODIUM CHLORIDE 0.9 % IV SOLN
INTRAVENOUS | Status: DC | PRN
  Administered 2016-01-26: 500 mL

## 2016-01-26 MED ORDER — BISACODYL 10 MG RE SUPP: 10.0000 mg | Freq: Every day | RECTAL | Status: DC | PRN

## 2016-01-26 MED ORDER — SIMVASTATIN 40 MG PO TABS
40.0000 mg | ORAL_TABLET | Freq: Every day | ORAL | Status: DC
  Administered 2016-01-26 – 2016-01-30 (×5): 40 mg via ORAL
  Filled 2016-01-26 (×5): qty 1

## 2016-01-26 MED ORDER — MIDAZOLAM HCL 2 MG/2ML IJ SOLN
INTRAMUSCULAR | Status: AC
  Filled 2016-01-26: qty 2

## 2016-01-26 MED ORDER — SODIUM CHLORIDE 0.9 % IV SOLN
INTRAVENOUS | Status: DC
  Administered 2016-01-26 – 2016-01-27 (×2): via INTRAVENOUS

## 2016-01-26 MED ORDER — METOPROLOL TARTRATE 1 MG/ML IV SOLN
2.0000 mg | INTRAVENOUS | Status: DC | PRN
  Filled 2016-01-26: qty 5

## 2016-01-26 MED ORDER — OXYCODONE HCL 5 MG/5ML PO SOLN: 5.0000 mg | Freq: Once | ORAL | Status: DC | PRN

## 2016-01-26 MED ORDER — FLEET ENEMA 7-19 GM/118ML RE ENEM: 1.0000 | ENEMA | Freq: Once | RECTAL | Status: DC | PRN

## 2016-01-26 MED ORDER — ALUM & MAG HYDROXIDE-SIMETH 200-200-20 MG/5ML PO SUSP: 15.0000 mL | ORAL | Status: DC | PRN

## 2016-01-26 MED ORDER — OXYCODONE HCL 5 MG PO TABS
5.0000 mg | ORAL_TABLET | Freq: Once | ORAL | Status: AC
  Administered 2016-01-26: 5 mg via ORAL
  Filled 2016-01-26: qty 1

## 2016-01-26 MED ORDER — ONDANSETRON HCL 4 MG/2ML IJ SOLN: 4.0000 mg | Freq: Four times a day (QID) | INTRAMUSCULAR | Status: DC | PRN

## 2016-01-26 MED ORDER — LIDOCAINE HCL (PF) 1 % IJ SOLN
INTRAMUSCULAR | Status: AC
  Filled 2016-01-26: qty 30

## 2016-01-26 MED ORDER — ENOXAPARIN SODIUM 40 MG/0.4ML ~~LOC~~ SOLN
40.0000 mg | SUBCUTANEOUS | Status: DC
  Administered 2016-01-26 – 2016-01-29 (×4): 40 mg via SUBCUTANEOUS
  Filled 2016-01-26 (×4): qty 0.4

## 2016-01-26 MED ORDER — LIDOCAINE HCL (PF) 1 % IJ SOLN
INTRAMUSCULAR | Status: DC | PRN
  Administered 2016-01-26: 10 mL

## 2016-01-26 MED ORDER — PANTOPRAZOLE SODIUM 40 MG PO TBEC
40.0000 mg | DELAYED_RELEASE_TABLET | Freq: Every day | ORAL | Status: DC
Start: 2016-01-26 — End: 2016-01-30
  Administered 2016-01-26 – 2016-01-30 (×5): 40 mg via ORAL
  Filled 2016-01-26 (×5): qty 1

## 2016-01-26 MED ORDER — MIDAZOLAM HCL 5 MG/5ML IJ SOLN
INTRAMUSCULAR | Status: DC | PRN
  Administered 2016-01-26 (×2): 1 mg via INTRAVENOUS

## 2016-01-26 MED ORDER — SODIUM CHLORIDE 0.9 % IV SOLN
INTRAVENOUS | Status: DC
  Administered 2016-01-26: 18:00:00 via INTRAVENOUS

## 2016-01-26 MED ORDER — IODIXANOL 320 MG/ML IV SOLN
INTRAVENOUS | Status: DC | PRN
  Administered 2016-01-26 (×2): 100 mL via INTRA_ARTERIAL
  Administered 2016-01-26: 20 mL via INTRA_ARTERIAL

## 2016-01-26 MED ORDER — FENTANYL CITRATE (PF) 100 MCG/2ML IJ SOLN
25.0000 ug | INTRAMUSCULAR | Status: DC | PRN
  Administered 2016-01-26 (×2): 50 ug via INTRAVENOUS

## 2016-01-26 MED ORDER — LACTATED RINGERS IV SOLN
INTRAVENOUS | Status: DC
  Administered 2016-01-26: 13:00:00 via INTRAVENOUS

## 2016-01-26 MED ORDER — MAGNESIUM OXIDE 400 (241.3 MG) MG PO TABS
400.0000 mg | ORAL_TABLET | Freq: Every day | ORAL | Status: DC
  Administered 2016-01-26 – 2016-01-30 (×5): 400 mg via ORAL
  Filled 2016-01-26 (×5): qty 1

## 2016-01-26 MED ORDER — OXYCODONE HCL 5 MG PO TABS: 5.0000 mg | ORAL_TABLET | Freq: Once | ORAL | Status: DC | PRN

## 2016-01-26 MED ORDER — ASPIRIN EC 81 MG PO TBEC
81.0000 mg | DELAYED_RELEASE_TABLET | Freq: Every day | ORAL | Status: DC
  Administered 2016-01-27 – 2016-01-30 (×4): 81 mg via ORAL
  Filled 2016-01-26 (×4): qty 1

## 2016-01-26 MED ORDER — FENTANYL CITRATE (PF) 250 MCG/5ML IJ SOLN
INTRAMUSCULAR | Status: AC
  Filled 2016-01-26: qty 5

## 2016-01-26 MED ORDER — PROPOFOL 10 MG/ML IV BOLUS
INTRAVENOUS | Status: AC
  Filled 2016-01-26: qty 20

## 2016-01-26 MED ORDER — CLONAZEPAM 0.5 MG PO TABS
0.5000 mg | ORAL_TABLET | Freq: Three times a day (TID) | ORAL | Status: DC | PRN
  Administered 2016-01-30: 0.5 mg via ORAL
  Filled 2016-01-26: qty 1

## 2016-01-26 MED ORDER — PROPOFOL 10 MG/ML IV BOLUS
INTRAVENOUS | Status: DC | PRN
  Administered 2016-01-26 (×3): 10 mg via INTRAVENOUS
  Administered 2016-01-26 (×3): 20 mg via INTRAVENOUS
  Administered 2016-01-26 (×2): 10 mg via INTRAVENOUS
  Administered 2016-01-26: 20 mg via INTRAVENOUS
  Administered 2016-01-26: 10 mg via INTRAVENOUS
  Administered 2016-01-26: 20 mg via INTRAVENOUS
  Administered 2016-01-26: 10 mg via INTRAVENOUS
  Administered 2016-01-26: 20 mg via INTRAVENOUS
  Administered 2016-01-26 (×4): 10 mg via INTRAVENOUS
  Administered 2016-01-26 (×2): 30 mg via INTRAVENOUS
  Administered 2016-01-26: 20 mg via INTRAVENOUS
  Administered 2016-01-26: 10 mg via INTRAVENOUS

## 2016-01-26 MED ORDER — MORPHINE SULFATE (PF) 2 MG/ML IV SOLN
2.0000 mg | INTRAVENOUS | Status: DC | PRN
  Administered 2016-01-26 – 2016-01-27 (×2): 2 mg via INTRAVENOUS
  Administered 2016-01-27: 5 mg via INTRAVENOUS
  Administered 2016-01-27 – 2016-01-28 (×14): 2 mg via INTRAVENOUS
  Administered 2016-01-29 (×2): 5 mg via INTRAVENOUS
  Administered 2016-01-29: 2 mg via INTRAVENOUS
  Administered 2016-01-29: 4 mg via INTRAVENOUS
  Administered 2016-01-29: 2 mg via INTRAVENOUS
  Administered 2016-01-29: 4 mg via INTRAVENOUS
  Filled 2016-01-26: qty 1
  Filled 2016-01-26 (×2): qty 3
  Filled 2016-01-26 (×4): qty 1
  Filled 2016-01-26: qty 2
  Filled 2016-01-26 (×3): qty 1
  Filled 2016-01-26: qty 2
  Filled 2016-01-26 (×8): qty 1
  Filled 2016-01-26 (×2): qty 3
  Filled 2016-01-26 (×3): qty 1

## 2016-01-26 MED ORDER — MAGNESIUM HYDROXIDE 400 MG/5ML PO SUSP: 30.0000 mL | Freq: Every day | ORAL | Status: DC | PRN

## 2016-01-26 MED ORDER — SENNA 8.6 MG PO TABS
1.0000 | ORAL_TABLET | Freq: Two times a day (BID) | ORAL | Status: DC
  Administered 2016-01-27: 8.6 mg via ORAL
  Filled 2016-01-26 (×6): qty 1

## 2016-01-26 MED ORDER — OXYCODONE HCL 5 MG PO TABS
10.0000 mg | ORAL_TABLET | Freq: Four times a day (QID) | ORAL | Status: DC | PRN
  Administered 2016-01-27 – 2016-01-30 (×8): 10 mg via ORAL
  Filled 2016-01-26 (×8): qty 2

## 2016-01-26 MED ORDER — LABETALOL HCL 5 MG/ML IV SOLN
10.0000 mg | INTRAVENOUS | Status: DC | PRN
  Administered 2016-01-30 (×2): 10 mg via INTRAVENOUS
  Filled 2016-01-26: qty 4

## 2016-01-26 MED ORDER — FENTANYL CITRATE (PF) 100 MCG/2ML IJ SOLN
INTRAMUSCULAR | Status: AC
  Filled 2016-01-26: qty 2

## 2016-01-26 MED ORDER — ONDANSETRON HCL 4 MG/2ML IJ SOLN: 4.0000 mg | Freq: Once | INTRAMUSCULAR | Status: DC | PRN

## 2016-01-26 SURGICAL SUPPLY — 52 items
CANISTER SUCTION 2500CC (MISCELLANEOUS) IMPLANT
CATH ANGIO 5F BER2 65CM (CATHETERS) IMPLANT
CATH BEACON 5.038 65CM KMP-01 (CATHETERS) IMPLANT
CATH OMNI FLUSH .035X70CM (CATHETERS) ×3 IMPLANT
COVER PROBE W GEL 5X96 (DRAPES) ×3 IMPLANT
DEVICE CLOSURE PERCLS PRGLD 6F (VASCULAR PRODUCTS) IMPLANT
DRAPE PERI GROIN 82X75IN TIB (DRAPES) ×3 IMPLANT
DRSG TEGADERM 2-3/8X2-3/4 SM (GAUZE/BANDAGES/DRESSINGS) ×6 IMPLANT
ELECT REM PT RETURN 9FT ADLT (ELECTROSURGICAL) ×6
ELECTRODE REM PT RTRN 9FT ADLT (ELECTROSURGICAL) ×2 IMPLANT
GAUZE SPONGE 2X2 8PLY STRL LF (GAUZE/BANDAGES/DRESSINGS) ×1 IMPLANT
GLOVE BIO SURGEON STRL SZ7 (GLOVE) ×3 IMPLANT
GLOVE BIOGEL PI IND STRL 6.5 (GLOVE) ×1 IMPLANT
GLOVE BIOGEL PI IND STRL 7.0 (GLOVE) ×1 IMPLANT
GLOVE BIOGEL PI IND STRL 7.5 (GLOVE) ×1 IMPLANT
GLOVE BIOGEL PI INDICATOR 6.5 (GLOVE) ×2
GLOVE BIOGEL PI INDICATOR 7.0 (GLOVE) ×2
GLOVE BIOGEL PI INDICATOR 7.5 (GLOVE) ×2
GLOVE ECLIPSE 7.5 STRL STRAW (GLOVE) ×3 IMPLANT
GLOVE SURG SS PI 6.5 STRL IVOR (GLOVE) ×3 IMPLANT
GOWN STRL REUS W/ TWL LRG LVL3 (GOWN DISPOSABLE) ×3 IMPLANT
GOWN STRL REUS W/TWL LRG LVL3 (GOWN DISPOSABLE) ×6
GRAFT BALLN CATH 65CM (STENTS) IMPLANT
KIT BASIN OR (CUSTOM PROCEDURE TRAY) ×3 IMPLANT
KIT ROOM TURNOVER OR (KITS) ×3 IMPLANT
LIQUID BAND (GAUZE/BANDAGES/DRESSINGS) ×3 IMPLANT
NEEDLE PERC 18GX7CM (NEEDLE) ×3 IMPLANT
NS IRRIG 1000ML POUR BTL (IV SOLUTION) ×3 IMPLANT
PACK ENDOVASCULAR (PACKS) ×3 IMPLANT
PAD ARMBOARD 7.5X6 YLW CONV (MISCELLANEOUS) IMPLANT
PERCLOSE PROGLIDE 6F (VASCULAR PRODUCTS)
SET MICROPUNCTURE 5F STIFF (MISCELLANEOUS) ×3 IMPLANT
SHEATH AVANTI 11CM 5FR (MISCELLANEOUS) ×3 IMPLANT
SHEATH AVANTI 11CM 8FR (MISCELLANEOUS) IMPLANT
SHEATH BRITE TIP 8FR 23CM (MISCELLANEOUS) IMPLANT
SPONGE GAUZE 2X2 STER 10/PKG (GAUZE/BANDAGES/DRESSINGS) ×2
STAPLER VISISTAT 35W (STAPLE) IMPLANT
STENT GRAFT BALLN CATH 65CM (STENTS)
STOPCOCK MORSE 400PSI 3WAY (MISCELLANEOUS) ×6 IMPLANT
SUT ETHILON 3 0 PS 1 (SUTURE) IMPLANT
SUT MNCRL AB 4-0 PS2 18 (SUTURE) IMPLANT
SUT PROLENE 5 0 C 1 24 (SUTURE) IMPLANT
SUT VIC AB 2-0 CTX 36 (SUTURE) IMPLANT
SUT VIC AB 3-0 SH 27 (SUTURE)
SUT VIC AB 3-0 SH 27X BRD (SUTURE) IMPLANT
SYR 30ML LL (SYRINGE) ×3 IMPLANT
SYR CONTROL 10ML LL (SYRINGE) ×3 IMPLANT
SYR MEDRAD MARK V 150ML (SYRINGE) ×3 IMPLANT
TRAY FOLEY W/METER SILVER 16FR (SET/KITS/TRAYS/PACK) IMPLANT
TUBING HIGH PRESSURE 120CM (CONNECTOR) ×6 IMPLANT
WIRE AMPLATZ SS-J .035X180CM (WIRE) IMPLANT
WIRE BENTSON .035X145CM (WIRE) ×3 IMPLANT

## 2016-01-26 NOTE — Op Note (Signed)
OPERATIVE NOTE   PROCEDURE: 1.  Left common femoral artery cannulation under ultrasound guidance 2.  Placement of catheter in aorta 3.  Aortogram 4.  Second order arterial selection 5.  Right leg runoff  PRE-OPERATIVE DIAGNOSIS: right femoropopliteal bypass, right foot ischemia  POST-OPERATIVE DIAGNOSIS: same as above   SURGEON: Adele Barthel, MD  ANESTHESIA: MAC/Local  ESTIMATED BLOOD LOSS: 50 cc  CONTRAST: 220 cc  FINDING(S):  Poor quality of distal leg images due to difficulties with patient movement  Aorta: Patent  Superior mesenteric artery: not well visualized Celiac artery: not visualized   Right Left  RA Not visualized Not visualized  CIA Patent Patent  EIA Patent Patent  IIA Not well visualized Not well visualized  CFA Patent   SFA Occluded, proximal femoropopliteal bypass patent   PFA Patent, collaterals evident   Pop Not visualized, only geniculate collaterals evident   Trif Occluded   AT Not visualized proximal, reconstitutes miniscle target distal   Pero Not well visualized proximal, reconstitutes distally, <2 mm target   PT Not well visualized, superimposed with bone edge, miniscule target    SPECIMEN(S):  none  INDICATIONS:   Briana Mosley is a 50 y.o. female who presents with occluded right femoropopliteal graft.  The patient presents for: aortogram, right leg runoff.  I discussed with the patient the nature of angiographic procedures, especially the limited patencies of any endovascular intervention.  The patient is aware of that the risks of an angiographic procedure include but are not limited to: bleeding, infection, access site complications, renal failure, embolization, rupture of vessel, dissection, possible need for emergent surgical intervention, possible need for surgical procedures to treat the patient's pathology, and stroke and death.  The patient is aware of the risks and agrees to proceed.  DESCRIPTION: After full informed  consent was obtained from the patient, the patient was brought back to the operating room.  The patient was placed supine upon the angiography table and connected to cardiopulmonary monitoring equipment.  The patient received light sedation under the supervision of an anesthetist throughout this case.  The patient was prepped and drape in the standard fashion for an angiographic procedure.  At this point, attention was turned to the left groin.  Under ultrasound guidance, the subcutaneous tissue surrounding the left common femoral artery was anesthesized with 1% lidocaine with epinephrine.  The artery was then cannulated with a micropuncture needle.  The microwire was advanced into the iliac arterial system.  The needle was exchanged for a microsheath, which was loaded into the common femoral artery over the wire.  The microwire was exchanged for a [facility] wire which was advanced into the aorta.  The microsheath was then exchanged for a 5-Fr sheath which was loaded into the common femoral artery.  The Omniflush catheter was then loaded over the wire up to the level of L1.  The catheter was connected to the power injector circuit.  After de-airring and de-clotting the circuit, a power injector aortogram was completed.  As a C-arm was utilized, the field of view was limited.  The findings are listed above.  The [facility] wire was replaced in the catheter, and using the [place] and Omniflush catheter, the right common iliac artery was selected.  The catheter and wire were advanced into the external iliac artery.  The right leg runoff was completed in station.  The findings are listed above.  Due to this patient involuntary leg movements and technical issues with the C-arm, limited images were  obtained.  The findings are listed above.   Based on the images, I felt this patient is likely to be a poor candidate for a femorotibial bypass on the right side.  I replaced the wire into the catheter, straightening out the crook in  the catheter.  Both were removed from the sheath together.  The sheath was aspirated.  No clots were present and the sheath was reloaded with heparinized saline.     COMPLICATIONS: none  CONDITION: stable   Adele Barthel, MD [facility] [place] Office: [phone] Pager: [phone]  01/26/2016, 5:08 PM

## 2016-01-26 NOTE — Anesthesia Preprocedure Evaluation (Addendum)
Anesthesia Evaluation  Patient identified by MRN, date of birth, ID band Patient awake    Reviewed: Allergy & Precautions, NPO status , Patient's Chart, lab work & pertinent test results  History of Anesthesia Complications Negative for: history of anesthetic complications  Airway Mallampati: II  TM Distance: >3 FB Neck ROM: Full    Dental  (+) Edentulous Upper, Partial Lower   Pulmonary Current Smoker,    breath sounds clear to auscultation       Cardiovascular hypertension, + Peripheral Vascular Disease   Rhythm:Regular Rate:Normal     Neuro/Psych    GI/Hepatic Neg liver ROS, GERD  ,  Endo/Other    Renal/GU      Musculoskeletal   Abdominal   Peds  Hematology negative hematology ROS (+)   Anesthesia Other Findings   Reproductive/Obstetrics                            Anesthesia Physical Anesthesia Plan  ASA: III  Anesthesia Plan: MAC   Post-op Pain Management:    Induction: Intravenous  Airway Management Planned: Natural Airway  Additional Equipment:   Intra-op Plan:   Post-operative Plan:   Informed Consent: I have reviewed the patients History and Physical, chart, labs and discussed the procedure including the risks, benefits and alternatives for the proposed anesthesia with the patient or authorized representative who has indicated his/her understanding and acceptance.   Dental advisory given  Plan Discussed with: CRNA and Surgeon  Anesthesia Plan Comments:         Anesthesia Quick Evaluation

## 2016-01-26 NOTE — H&P (View-Only) (Signed)
Established Previous Bypass   History of Present Illness  Briana Mosley is a 50 y.o. (Feb 06, 1966) female who presents with chief complaint: pain in right foot and lower leg.  Previous operation(s) include:  1. R CFA to BK pop BPG w/ NR ips GSV (Date: 03/06/15).  2. Revision of popliteal anastomosis for bleeding (Date: 03/16/15).  This patient failed to show for her last schedule appointment due to a family death.  ~3 week ago she has sudden worsening in discoloration in her right foot.  Since then she has had significant increase in pain in right foot with swelling.  The patient's symptoms are: rest pain. The patient's treatment regimen currently included: maximal medical management.  Past Medical History  Diagnosis Date  . Chronic back pain   . Hypertension   . Thyroid disease     hyperthyroidism  . Lower extremity weakness   . Anxiety   . GERD (gastroesophageal reflux disease)     otc med if needed    Past Surgical History  Procedure Laterality Date  . Cholecystectomy    . Aortogram N/A 02/24/2015    Procedure: AORTOGRAM WITH BILATERAL LOWER EXTREMITY RUNOFF;  Surgeon: Conrad [place], MD;  Location: [place];  Service: Vascular;  Laterality: N/A;  . Cardiac catheterization N/A 02/28/2015    Procedure: Left Heart Cath and Coronary Angiography;  Surgeon: Adrian Prows, MD;  Location: [place] [facility];  Service: Cardiovascular;  Laterality: N/A;  . Femoral-popliteal bypass graft Right 03/06/2015    Procedure: RIGHT LEG FEMORAL-BELOW KNEE POPLITEAL ARTERY;  Surgeon: Conrad Crossnore, MD;  Location: [place];  Service: Vascular;  Laterality: Right;  . Femoral-popliteal bypass graft N/A 03/16/2015    Procedure: Exploration of ;  Surgeon: Rosetta Posner, MD;  Location: [facility] OR;  Service: Vascular;  Laterality: N/A;  . Femoral artery exploration Right 03/16/2015    Procedure: Exploration of below the knee popliteal anastimosis, repair of femoral popliteal anastimosis, evacuation of hematoma right.;   Surgeon: Rosetta Posner, MD;  Location: [place];  Service: Vascular;  Laterality: Right;  . Thoracic laminectomy  04/2010    w/tumor resetion/notes 04/19/2010  . Back surgery      Social History   Social History  . Marital Status: Legally Separated    Spouse Name: N/A  . Number of Children: N/A  . Years of Education: N/A   Occupational History  . Not on file.   Social History Main Topics  . Smoking status: Current Every Day Smoker -- 1.00 packs/day for 30 years    Types: Cigarettes  . Smokeless tobacco: Never Used  . Alcohol Use: No  . Drug Use: No  . Sexual Activity: Not on file   Other Topics Concern  . Not on file   Social History Narrative    Family History  Problem Relation Age of Onset  . Cancer Mother   . Heart disease Father   . Diabetes Sister     Current Outpatient Prescriptions  Medication Sig Dispense Refill  . ALPRAZolam (XANAX) 0.5 MG tablet Take 0.5 tablets (0.25 mg total) by mouth 3 (three) times daily as needed for anxiety.    Marland Kitchen aspirin EC 81 MG tablet Take 1 tablet (81 mg total) by mouth daily.    . clonazePAM (KLONOPIN) 0.5 MG tablet Take 0.5 mg by mouth 3 (three) times daily as needed for anxiety.    . magnesium oxide (MAG-OX) 400 MG tablet Take 400 mg by mouth daily.    Marland Kitchen  Oxycodone HCl 10 MG TABS Take 1 tablet (10 mg total) by mouth 4 (four) times daily as needed (pain). 30 tablet 0  . simvastatin (ZOCOR) 10 MG tablet Take 1 tablet (10 mg total) by mouth daily. (Patient taking differently: Take 40 mg by mouth daily. Per patients niece she is now taking 40 mg orally) 30 tablet 11  . amoxicillin (AMOXIL) 500 MG capsule Reported on 01/23/2016    . baclofen (LIORESAL) 10 MG tablet Take 10 mg by mouth 4 (four) times daily. Reported on 01/23/2016    . furosemide (LASIX) 40 MG tablet Reported on 01/23/2016    . levofloxacin (LEVAQUIN) 750 MG tablet Take 1 tablet (750 mg total) by mouth daily. (Patient not taking: Reported on 01/23/2016) 14 tablet 0  . potassium  chloride (K-DUR,KLOR-CON) 10 MEQ tablet Reported on 01/23/2016     No current facility-administered medications for this visit.     Allergies  Allergen Reactions  . Bactrim   . Neurontin [Gabapentin]   . Tramadol Nausea Only  . Ciprofloxacin Other (See Comments)    hallucination  . Sulfa Antibiotics Rash     REVIEW OF SYSTEMS:  (Positives checked otherwise negative)  CARDIOVASCULAR:   [ ]  chest pain,  [ ]  chest pressure,  [ ]  palpitations,  [ ]  shortness of breath when laying flat,  [ ]  shortness of breath with exertion,   [x]  pain in feet when walking,  [x]  pain in feet when laying flat, [ ]  history of blood clot in veins (DVT),  [ ]  history of phlebitis,  [x]  swelling in legs,  [ ]  varicose veins  PULMONARY:   [ ]  productive cough,  [ ]  asthma,  [ ]  wheezing  NEUROLOGIC:   [ ]  weakness in arms or legs,  [ ]  numbness in arms or legs,  [ ]  difficulty speaking or slurred speech,  [ ]  temporary loss of vision in one eye,  [ ]  dizziness  HEMATOLOGIC:   [ ]  bleeding problems,  [ ]  problems with blood clotting too easily  MUSCULOSKEL:   [ ]  joint pain, [ ]  joint swelling  GASTROINTEST:   [ ]  vomiting blood,  [ ]  blood in stool     GENITOURINARY:   [ ]  burning with urination,  [ ]  blood in urine  PSYCHIATRIC:   [ ]  history of major depression  INTEGUMENTARY:   [ ]  rashes,  [ ]  ulcers  CONSTITUTIONAL:   [ ]  fever,  [ ]  chills     Physical Examination  Vitals  Temp 97.1 F, 108/61, HR 68  General: A&O x 3, WD, distress evident  Pulmonary: Sym exp, good air movt, CTAB, no rales, rhonchi, & wheezing  Cardiac: RRR, Nl S1, S2, no Murmurs, rubs or gallops  Vascular: Vessel Right Left  Radial Palpable Palpable  Brachial Palpable Palpable  Carotid Palpable, without bruit Palpable, without bruit  Aorta Not palpable N/A  Femoral Palpable Palpable  Popliteal Not palpable Not palpable  PT Not Palpable Not Palpable  DP Not Palpable Not Palpable    Gastrointestinal: soft, NTND, no G/R, no HSM, no masses, no CVAT B  Musculoskeletal: M/S 5/5 throughout , R lower leg swollen, cyanosis in right foot not dependent, tenderness to palpation of plantar surface, ischemic skin changes in plantar surface  Neurologic: Pain and light touch intact in extremities , Motor exam as listed above   Non-Invasive Vascular Imaging ABI (Date: 01/09/16)  R:   ABI: 0.20 (0.76),   DP: ND,  PT: mono,   TBI: ND  L:   ABI: 0.58 (0.47),   DP: mono,   PT: mono,   TBI: ND  Bypass Duplex (Date: 01/09/16)  Occluded bypass   Medical Decision Making  Briana Mosley is a 50 y.o. 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  Her foot demonstrates clear signs of ischemia.  I recommended: repeat R leg angiogram to determine remaining revascularization options.  She is scheduled for this coming Monday 24th April  She will likely need a repeat R CFA to BK pop bypass vs R CFA to tibial artery bypass depending on the available distal target.  I discussed in depth with the patient the nature of atherosclerosis, and emphasized the importance of maximal medical management including strict control of blood pressure, blood glucose, and lipid levels, obtaining regular exercise, and cessation of smoking.    The patient is aware that without maximal medical management the underlying atherosclerotic disease process will progress, limiting the benefit of any interventions.  I discussed in depth with the patient the need for long term surveillance to improve the primary assisted patency of his bypass.  The patient agrees to cooperate with such. The patient is currently on a statin: Zocor.  The patient is currently on an anti-platelet: ASA.  Thank you for allowing [place] to participate in this patient's care.   Adele Barthel, MD [facility]  [place] Office: [phone] Pager: [phone]  01/22/2016, 10:01 PM

## 2016-01-26 NOTE — Interval H&P Note (Signed)
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 After consideration of risks, benefits and other options for treatment, the patient has consented to  Procedure(s): AORTOGRAM (N/A) as a surgical intervention .  The patient's history has been reviewed, patient examined, no change in status, stable for surgery.  I have reviewed the patient's chart and labs.  Questions were answered to the patient's satisfaction.     Adele Barthel

## 2016-01-26 NOTE — Transfer of Care (Signed)
Immediate Anesthesia Transfer of Care Note  Patient: [name]  Procedure(s) Performed: Procedure(s): ABDOMINAL AORTOGRAM  WITH  RIGHT LEG RUN OFF (N/A)  Patient Location: PACU  Anesthesia Type:MAC  Level of Consciousness: awake, alert  and oriented  Airway & Oxygen Therapy: Patient Spontanous Breathing and Patient connected to nasal cannula oxygen  Post-op Assessment: Report given to RN and Post -op Vital signs reviewed and stable  Post vital signs: Reviewed and stable  Last Vitals:  Filed Vitals:   01/26/16 1300  BP: 133/64  Pulse: 92  Temp: 36.3 C  Resp: 18    Complications: No apparent anesthesia complications

## 2016-01-27 ENCOUNTER — Encounter (HOSPITAL_COMMUNITY): Payer: Self-pay | Admitting: General Practice

## 2016-01-27 LAB — URINALYSIS, ROUTINE W REFLEX MICROSCOPIC
Bilirubin Urine: NEGATIVE
GLUCOSE, UA: NEGATIVE mg/dL
Ketones, ur: NEGATIVE mg/dL
Nitrite: NEGATIVE
PH: 7.5 (ref 5.0–8.0)
PROTEIN: NEGATIVE mg/dL
Specific Gravity, Urine: 1.019 (ref 1.005–1.030)

## 2016-01-27 LAB — BASIC METABOLIC PANEL
Anion gap: 8 (ref 5–15)
BUN: 11 mg/dL (ref 6–20)
CALCIUM: 8.6 mg/dL — AB (ref 8.9–10.3)
CO2: 27 mmol/L (ref 22–32)
Chloride: 103 mmol/L (ref 101–111)
Creatinine, Ser: 0.71 mg/dL (ref 0.44–1.00)
GFR calc Af Amer: >60 mL/min (ref >60)
GLUCOSE: 93 mg/dL (ref 65–99)
POTASSIUM: 4.2 mmol/L (ref 3.5–5.1)
Sodium: 138 mmol/L (ref 135–145)

## 2016-01-27 LAB — CBC
HEMATOCRIT: 35.9 % — AB (ref 36.0–46.0)
Hemoglobin: 11.7 g/dL — ABNORMAL LOW (ref 12.0–15.0)
MCH: 28.5 pg (ref 26.0–34.0)
MCHC: 32.6 g/dL (ref 30.0–36.0)
MCV: 87.6 fL (ref 78.0–100.0)
PLATELETS: 172 10*3/uL (ref 150–400)
RBC: 4.1 MIL/uL (ref 3.87–5.11)
RDW: 13.4 % (ref 11.5–15.5)
WBC: 7.4 10*3/uL (ref 4.0–10.5)

## 2016-01-27 LAB — URINE MICROSCOPIC-ADD ON

## 2016-01-27 MED ORDER — NITROFURANTOIN MONOHYD MACRO 100 MG PO CAPS
100.0000 mg | ORAL_CAPSULE | Freq: Two times a day (BID) | ORAL | Status: DC
  Administered 2016-01-27 – 2016-01-30 (×7): 100 mg via ORAL
  Filled 2016-01-27 (×8): qty 1

## 2016-01-27 MED ORDER — CEFAZOLIN SODIUM 1-5 GM-% IV SOLN
1.0000 g | INTRAVENOUS | Status: DC
  Filled 2016-01-27: qty 50

## 2016-01-27 NOTE — Progress Notes (Signed)
Pt agreed to having surgery. MD paged. Will continue to monitor.

## 2016-01-27 NOTE — Progress Notes (Signed)
Utilization review completed.  

## 2016-01-27 NOTE — Progress Notes (Addendum)
   Daily Progress Note  Assessment/Planning: POD #1 s/p Ao, RRo for R leg ischemia, UTI   I had a long discussion with the patient in regards to the findings of the angiogram and possible options  Given the small targets, I doubt any meaningful long term patency for a fem-tib bypass in right leg, subsequently, I discussed proceeding with a R AKA with the patient.  She is considering proceeding with such. I discussed in depth the nature of right above-the-knee amputation with the patient, including risks, benefits, and alternatives.   The patient is aware that the risks of above-the-knee amputation include but are not limited to: bleeding, infection, myocardial infarction, stroke, death, failure to heal amputation wound, and possible need for more proximal amputation.   As I will be in clinic tomorrow, one of my partners will have to do the case.  I discussed with the patient, Dr. Donnetta Hutching. In regards to her UTI, I am giving her Macrobid 100 mg PO BID x 7 days  Subjective  - 1 Day Post-Op  Pain control so-so  Objective Filed Vitals:   01/26/16 1830 01/26/16 1940 01/27/16 0043 01/27/16 0603  BP:  148/61 150/59 119/97  Pulse:  99 97 100  Temp: 97.8 F (36.6 C) 98.1 F (36.7 C) 97.9 F (36.6 C) 97.9 F (36.6 C)  TempSrc:   Oral Oral  Resp:  16 17 19   Weight:      SpO2:  99% 100% 100%    Intake/Output Summary (Last 24 hours) at 01/27/16 1154 Last data filed at 01/26/16 1800  Gross per 24 hour  Intake 836.66 ml  Output      1 ml  Net 835.66 ml    PULM  CTAB CV  RRR GI  soft, NTND VASC  L groin without hematoma, R lower leg with frank skin ulceration, R foot appears ischemic  Laboratory CBC    Component Value Date/Time   WBC 7.4 01/27/2016 0533   HGB 11.7* 01/27/2016 0533   HCT 35.9* 01/27/2016 0533   PLT 172 01/27/2016 0533    BMET    Component Value Date/Time   NA 138 01/27/2016 0533   K 4.2 01/27/2016 0533   CL 103 01/27/2016 0533   CO2 27 01/27/2016 0533   GLUCOSE 93 01/27/2016 0533   BUN 11 01/27/2016 0533   CREATININE 0.71 01/27/2016 0533   CALCIUM 8.6* 01/27/2016 0533   GFRNONAA >60 01/27/2016 0533   GFRAA >60 01/27/2016 0533    Adele Barthel, MD [facility]: [phone] Pager: [phone]  01/27/2016, 11:54 AM

## 2016-01-27 NOTE — Anesthesia Postprocedure Evaluation (Signed)
Anesthesia Post Note  Patient: Briana Mosley  Procedure(s) Performed: Procedure(s) (LRB): ABDOMINAL AORTOGRAM  WITH  RIGHT LEG RUN OFF (N/A)  Patient location during evaluation: PACU Anesthesia Type: MAC Level of consciousness: awake, awake and alert and oriented Pain management: pain level controlled Vital Signs Assessment: post-procedure vital signs reviewed and stable Respiratory status: spontaneous breathing, nonlabored ventilation and respiratory function stable Cardiovascular status: blood pressure returned to baseline Anesthetic complications: no    Last Vitals:  Filed Vitals:   01/27/16 1407 01/27/16 2029  BP: 140/59 131/58  Pulse: 97 91  Temp: 36.7 C 36.7 C  Resp: 18 18    Last Pain:  Filed Vitals:   01/27/16 2052  PainSc: 9                  Eleuterio Dollar COKER

## 2016-01-28 ENCOUNTER — Encounter (HOSPITAL_COMMUNITY): Payer: Self-pay | Admitting: Certified Registered Nurse Anesthetist

## 2016-01-28 ENCOUNTER — Encounter (HOSPITAL_COMMUNITY)
Admission: RE | Disposition: A | Discharge status: Home Health | Payer: Self-pay | Source: Ambulatory Visit | Attending: Vascular Surgery

## 2016-01-28 ENCOUNTER — Inpatient Hospital Stay (HOSPITAL_COMMUNITY): Payer: Medicare Other | Admitting: Certified Registered Nurse Anesthetist

## 2016-01-28 HISTORY — PX: AMPUTATION: SHX166

## 2016-01-28 LAB — CBC
HCT: 34.7 % — ABNORMAL LOW (ref 36.0–46.0)
Hemoglobin: 11.4 g/dL — ABNORMAL LOW (ref 12.0–15.0)
MCH: 29 pg (ref 26.0–34.0)
MCHC: 32.9 g/dL (ref 30.0–36.0)
MCV: 88.3 fL (ref 78.0–100.0)
PLATELETS: 148 10*3/uL — AB (ref 150–400)
RBC: 3.93 MIL/uL (ref 3.87–5.11)
RDW: 13.2 % (ref 11.5–15.5)
WBC: 6.1 10*3/uL (ref 4.0–10.5)

## 2016-01-28 LAB — BASIC METABOLIC PANEL
Anion gap: 11 (ref 5–15)
BUN: 5 mg/dL — ABNORMAL LOW (ref 6–20)
CALCIUM: 8.7 mg/dL — AB (ref 8.9–10.3)
CHLORIDE: 107 mmol/L (ref 101–111)
CO2: 23 mmol/L (ref 22–32)
CREATININE: 0.67 mg/dL (ref 0.44–1.00)
GFR calc non Af Amer: >60 mL/min (ref >60)
Glucose, Bld: 97 mg/dL (ref 65–99)
Potassium: 3.9 mmol/L (ref 3.5–5.1)
SODIUM: 141 mmol/L (ref 135–145)

## 2016-01-28 LAB — URINE CULTURE

## 2016-01-28 LAB — SURGICAL PCR SCREEN
MRSA, PCR: NEGATIVE
STAPHYLOCOCCUS AUREUS: NEGATIVE

## 2016-01-28 SURGERY — AMPUTATION, ABOVE KNEE
Anesthesia: General | Site: Leg Upper | Laterality: Right

## 2016-01-28 MED ORDER — DOCUSATE SODIUM 100 MG PO CAPS
100.0000 mg | ORAL_CAPSULE | Freq: Every day | ORAL | Status: DC
  Filled 2016-01-28 (×2): qty 1

## 2016-01-28 MED ORDER — LIDOCAINE HCL (CARDIAC) 20 MG/ML IV SOLN
INTRAVENOUS | Status: DC | PRN
  Administered 2016-01-28: 60 mg via INTRAVENOUS

## 2016-01-28 MED ORDER — ACETAMINOPHEN 325 MG RE SUPP: 325.0000 mg | RECTAL | Status: DC | PRN

## 2016-01-28 MED ORDER — DEXAMETHASONE SODIUM PHOSPHATE 10 MG/ML IJ SOLN
INTRAMUSCULAR | Status: DC | PRN
  Administered 2016-01-28: 10 mg via INTRAVENOUS

## 2016-01-28 MED ORDER — LACTATED RINGERS IV SOLN
INTRAVENOUS | Status: DC
  Administered 2016-01-28: 12:00:00 via INTRAVENOUS

## 2016-01-28 MED ORDER — PHENYLEPHRINE HCL 10 MG/ML IJ SOLN
INTRAMUSCULAR | Status: DC | PRN
  Administered 2016-01-28 (×2): 80 ug via INTRAVENOUS

## 2016-01-28 MED ORDER — NICOTINE 21 MG/24HR TD PT24
21.0000 mg | MEDICATED_PATCH | Freq: Every day | TRANSDERMAL | Status: DC
Start: 2016-01-29 — End: 2016-01-30
  Administered 2016-01-29 – 2016-01-30 (×2): 21 mg via TRANSDERMAL
  Filled 2016-01-28 (×2): qty 1

## 2016-01-28 MED ORDER — DEXTROSE 5 % IV SOLN
1.5000 g | Freq: Two times a day (BID) | INTRAVENOUS | Status: AC
  Administered 2016-01-28 – 2016-01-29 (×2): 1.5 g via INTRAVENOUS
  Filled 2016-01-28 (×2): qty 1.5

## 2016-01-28 MED ORDER — SUCCINYLCHOLINE CHLORIDE 20 MG/ML IJ SOLN
INTRAMUSCULAR | Status: AC
  Filled 2016-01-28: qty 1

## 2016-01-28 MED ORDER — HYDROMORPHONE HCL 1 MG/ML IJ SOLN
INTRAMUSCULAR | Status: AC
  Administered 2016-01-28: 0.5 mg via INTRAVENOUS
  Filled 2016-01-28: qty 1

## 2016-01-28 MED ORDER — MAGNESIUM SULFATE 2 GM/50ML IV SOLN
2.0000 g | Freq: Every day | INTRAVENOUS | Status: DC | PRN
  Filled 2016-01-28: qty 50

## 2016-01-28 MED ORDER — ONDANSETRON HCL 4 MG/2ML IJ SOLN: 4.0000 mg | Freq: Four times a day (QID) | INTRAMUSCULAR | Status: DC | PRN

## 2016-01-28 MED ORDER — LIDOCAINE HCL (CARDIAC) 20 MG/ML IV SOLN
INTRAVENOUS | Status: AC
  Filled 2016-01-28: qty 5

## 2016-01-28 MED ORDER — OXYCODONE HCL 5 MG PO TABS: 5.0000 mg | ORAL_TABLET | Freq: Once | ORAL | Status: DC | PRN

## 2016-01-28 MED ORDER — FENTANYL CITRATE (PF) 250 MCG/5ML IJ SOLN
INTRAMUSCULAR | Status: DC | PRN
  Administered 2016-01-28 (×2): 25 ug via INTRAVENOUS
  Administered 2016-01-28: 50 ug via INTRAVENOUS
  Administered 2016-01-28 (×4): 25 ug via INTRAVENOUS

## 2016-01-28 MED ORDER — ONDANSETRON HCL 4 MG/2ML IJ SOLN
INTRAMUSCULAR | Status: DC | PRN
  Administered 2016-01-28: 4 mg via INTRAVENOUS

## 2016-01-28 MED ORDER — ROCURONIUM BROMIDE 50 MG/5ML IV SOLN
INTRAVENOUS | Status: AC
  Filled 2016-01-28: qty 1

## 2016-01-28 MED ORDER — MIDAZOLAM HCL 2 MG/2ML IJ SOLN
INTRAMUSCULAR | Status: AC
  Filled 2016-01-28: qty 2

## 2016-01-28 MED ORDER — FENTANYL CITRATE (PF) 250 MCG/5ML IJ SOLN
INTRAMUSCULAR | Status: AC
  Filled 2016-01-28: qty 5

## 2016-01-28 MED ORDER — PHENYLEPHRINE 40 MCG/ML (10ML) SYRINGE FOR IV PUSH (FOR BLOOD PRESSURE SUPPORT)
PREFILLED_SYRINGE | INTRAVENOUS | Status: AC
  Filled 2016-01-28: qty 10

## 2016-01-28 MED ORDER — PHENOL 1.4 % MT LIQD
1.0000 | OROMUCOSAL | Status: DC | PRN
Start: 2016-01-28 — End: 2016-01-30

## 2016-01-28 MED ORDER — OXYCODONE HCL 5 MG/5ML PO SOLN: 5.0000 mg | Freq: Once | ORAL | Status: DC | PRN

## 2016-01-28 MED ORDER — HYDROMORPHONE HCL 1 MG/ML IJ SOLN
0.2500 mg | INTRAMUSCULAR | Status: DC | PRN
  Administered 2016-01-28 (×2): 0.5 mg via INTRAVENOUS

## 2016-01-28 MED ORDER — LORAZEPAM 2 MG/ML IJ SOLN
1.0000 mg | Freq: Once | INTRAMUSCULAR | Status: AC
  Administered 2016-01-28: 1 mg via INTRAVENOUS
  Filled 2016-01-28: qty 1

## 2016-01-28 MED ORDER — 0.9 % SODIUM CHLORIDE (POUR BTL) OPTIME
TOPICAL | Status: DC | PRN
  Administered 2016-01-28: 1000 mL

## 2016-01-28 MED ORDER — DEXAMETHASONE SODIUM PHOSPHATE 10 MG/ML IJ SOLN
INTRAMUSCULAR | Status: AC
  Filled 2016-01-28: qty 1

## 2016-01-28 MED ORDER — MIDAZOLAM HCL 2 MG/2ML IJ SOLN
INTRAMUSCULAR | Status: DC | PRN
  Administered 2016-01-28: 2 mg via INTRAVENOUS

## 2016-01-28 MED ORDER — PROPOFOL 10 MG/ML IV BOLUS
INTRAVENOUS | Status: AC
  Filled 2016-01-28: qty 20

## 2016-01-28 MED ORDER — ONDANSETRON HCL 4 MG/2ML IJ SOLN
INTRAMUSCULAR | Status: AC
  Filled 2016-01-28: qty 2

## 2016-01-28 MED ORDER — EPHEDRINE SULFATE 50 MG/ML IJ SOLN
INTRAMUSCULAR | Status: AC
  Filled 2016-01-28: qty 1

## 2016-01-28 MED ORDER — PROPOFOL 10 MG/ML IV BOLUS
INTRAVENOUS | Status: DC | PRN
  Administered 2016-01-28: 130 mg via INTRAVENOUS

## 2016-01-28 MED ORDER — GUAIFENESIN-DM 100-10 MG/5ML PO SYRP: 15.0000 mL | ORAL_SOLUTION | ORAL | Status: DC | PRN

## 2016-01-28 MED ORDER — SODIUM CHLORIDE 0.9 % IV SOLN
INTRAVENOUS | Status: DC
  Administered 2016-01-28 – 2016-01-29 (×3): via INTRAVENOUS

## 2016-01-28 MED ORDER — ACETAMINOPHEN 325 MG PO TABS
325.0000 mg | ORAL_TABLET | ORAL | Status: DC | PRN
  Administered 2016-01-30: 650 mg via ORAL
  Filled 2016-01-28: qty 2

## 2016-01-28 MED ORDER — POTASSIUM CHLORIDE CRYS ER 20 MEQ PO TBCR
20.0000 meq | EXTENDED_RELEASE_TABLET | Freq: Every day | ORAL | Status: DC | PRN
Start: 2016-01-28 — End: 2016-01-30

## 2016-01-28 SURGICAL SUPPLY — 49 items
BANDAGE ELASTIC 6 VELCRO ST LF (GAUZE/BANDAGES/DRESSINGS) ×3 IMPLANT
BANDAGE ESMARK 6X9 LF (GAUZE/BANDAGES/DRESSINGS) IMPLANT
BLADE SAW RECIP 87.9 MT (BLADE) ×3 IMPLANT
BNDG COHESIVE 6X5 TAN STRL LF (GAUZE/BANDAGES/DRESSINGS) ×3 IMPLANT
BNDG ESMARK 6X9 LF (GAUZE/BANDAGES/DRESSINGS)
BNDG GAUZE ELAST 4 BULKY (GAUZE/BANDAGES/DRESSINGS) ×3 IMPLANT
CANISTER SUCTION 2500CC (MISCELLANEOUS) ×3 IMPLANT
CLIP TI MEDIUM 6 (CLIP) ×3 IMPLANT
COVER SURGICAL LIGHT HANDLE (MISCELLANEOUS) ×3 IMPLANT
CUFF TOURNIQUET SINGLE 24IN (TOURNIQUET CUFF) IMPLANT
CUFF TOURNIQUET SINGLE 34IN LL (TOURNIQUET CUFF) IMPLANT
CUFF TOURNIQUET SINGLE 44IN (TOURNIQUET CUFF) IMPLANT
DRAPE ORTHO SPLIT 77X108 STRL (DRAPES) ×4
DRAPE PROXIMA HALF (DRAPES) IMPLANT
DRAPE SURG ORHT 6 SPLT 77X108 (DRAPES) ×2 IMPLANT
DRSG ADAPTIC 3X8 NADH LF (GAUZE/BANDAGES/DRESSINGS) ×3 IMPLANT
DRSG PAD ABDOMINAL 8X10 ST (GAUZE/BANDAGES/DRESSINGS) ×3 IMPLANT
ELECT REM PT RETURN 9FT ADLT (ELECTROSURGICAL) ×3
ELECTRODE REM PT RTRN 9FT ADLT (ELECTROSURGICAL) ×1 IMPLANT
EVACUATOR 1/8 PVC DRAIN (DRAIN) ×3 IMPLANT
GAUZE SPONGE 4X4 12PLY STRL (GAUZE/BANDAGES/DRESSINGS) ×3 IMPLANT
GLOVE BIO SURGEON STRL SZ7 (GLOVE) ×3 IMPLANT
GLOVE ECLIPSE 7.0 STRL STRAW (GLOVE) ×3 IMPLANT
GLOVE SS BIOGEL STRL SZ 7 (GLOVE) ×1 IMPLANT
GLOVE SUPERSENSE BIOGEL SZ 7 (GLOVE) ×2
GOWN STRL REUS W/ TWL LRG LVL3 (GOWN DISPOSABLE) ×3 IMPLANT
GOWN STRL REUS W/ TWL XL LVL3 (GOWN DISPOSABLE) ×1 IMPLANT
GOWN STRL REUS W/TWL LRG LVL3 (GOWN DISPOSABLE) ×6
GOWN STRL REUS W/TWL XL LVL3 (GOWN DISPOSABLE) ×2
KIT BASIN OR (CUSTOM PROCEDURE TRAY) ×3 IMPLANT
KIT ROOM TURNOVER OR (KITS) ×3 IMPLANT
NS IRRIG 1000ML POUR BTL (IV SOLUTION) ×3 IMPLANT
PACK GENERAL/GYN (CUSTOM PROCEDURE TRAY) ×3 IMPLANT
PAD ABD 8X10 STRL (GAUZE/BANDAGES/DRESSINGS) ×3 IMPLANT
PAD ARMBOARD 7.5X6 YLW CONV (MISCELLANEOUS) ×6 IMPLANT
PADDING CAST COTTON 6X4 STRL (CAST SUPPLIES) IMPLANT
SPONGE GAUZE 4X4 12PLY STER LF (GAUZE/BANDAGES/DRESSINGS) ×3 IMPLANT
STAPLER VISISTAT 35W (STAPLE) ×3 IMPLANT
STOCKINETTE IMPERVIOUS LG (DRAPES) ×3 IMPLANT
SUT SILK 2 0 (SUTURE) ×2
SUT SILK 2 0 SH CR/8 (SUTURE) ×3 IMPLANT
SUT SILK 2-0 18XBRD TIE 12 (SUTURE) ×1 IMPLANT
SUT SILK 3 0 (SUTURE) ×2
SUT SILK 3-0 18XBRD TIE 12 (SUTURE) ×1 IMPLANT
SUT VIC AB 2-0 CT1 18 (SUTURE) ×3 IMPLANT
SUT VIC AB 2-0 CT1 27 (SUTURE) ×4
SUT VIC AB 2-0 CT1 TAPERPNT 27 (SUTURE) ×2 IMPLANT
UNDERPAD 30X30 INCONTINENT (UNDERPADS AND DIAPERS) ×3 IMPLANT
WATER STERILE IRR 1000ML POUR (IV SOLUTION) ×3 IMPLANT

## 2016-01-28 NOTE — Anesthesia Preprocedure Evaluation (Signed)
Anesthesia Evaluation  Patient identified by MRN, date of birth, ID band Patient awake    Reviewed: Allergy & Precautions, NPO status , Patient's Chart, lab work & pertinent test results  Airway Mallampati: II   Neck ROM: full    Dental   Pulmonary Current Smoker,    breath sounds clear to auscultation       Cardiovascular hypertension, + Peripheral Vascular Disease   Rhythm:regular Rate:Normal     Neuro/Psych Anxiety    GI/Hepatic GERD  ,  Endo/Other    Renal/GU      Musculoskeletal   Abdominal   Peds  Hematology   Anesthesia Other Findings   Reproductive/Obstetrics                             Anesthesia Physical Anesthesia Plan  ASA: II  Anesthesia Plan: General   Post-op Pain Management:    Induction: Intravenous  Airway Management Planned: LMA  Additional Equipment:   Intra-op Plan:   Post-operative Plan:   Informed Consent: I have reviewed the patients History and Physical, chart, labs and discussed the procedure including the risks, benefits and alternatives for the proposed anesthesia with the patient or authorized representative who has indicated his/her understanding and acceptance.     Plan Discussed with: CRNA, Anesthesiologist and Surgeon  Anesthesia Plan Comments:         Anesthesia Quick Evaluation

## 2016-01-28 NOTE — Interval H&P Note (Signed)
History and Physical Interval Note:  01/28/2016 12:26 PM  Briana Mosley  has presented today for surgery, with the diagnosis of peripheral vascular disease with right leg ischemia I70.221  M62.89  The various methods of treatment have been discussed with the patient and family. After consideration of risks, benefits and other options for treatment, the patient has consented to  Procedure(s): AMPUTATION ABOVE KNEE (Right) as a surgical intervention .  The patient's history has been reviewed, patient examined, no change in status, stable for surgery.  I have reviewed the patient's chart and labs.  Questions were answered to the patient's satisfaction.     Tinnie Gens

## 2016-01-28 NOTE — Transfer of Care (Signed)
Immediate Anesthesia Transfer of Care Note  Patient: Briana Mosley  Procedure(s) Performed: Procedure(s): AMPUTATION ABOVE KNEE RIGHT (Right)  Patient Location: PACU  Anesthesia Type:General  Level of Consciousness: awake  Airway & Oxygen Therapy: Patient Spontanous Breathing  Post-op Assessment: Report given to RN and Post -op Vital signs reviewed and stable  Post vital signs: stable  Last Vitals:  Filed Vitals:   01/27/16 2029 01/28/16 0331  BP: 131/58 144/69  Pulse: 91 97  Temp: 36.7 C 36.8 C  Resp: 18 18    Last Pain:  Filed Vitals:   01/28/16 1033  PainSc: 3       Patients Stated Pain Goal: 0 ([id number])  Complications: No apparent anesthesia complications

## 2016-01-28 NOTE — Anesthesia Procedure Notes (Signed)
Procedure Name: Intubation Date/Time: 01/28/2016 1:01 PM Performed by: Rejeana Brock L Pre-anesthesia Checklist: Patient identified, Emergency Drugs available, Timeout performed, Suction available and Patient being monitored Patient Re-evaluated:Patient Re-evaluated prior to inductionOxygen Delivery Method: Circle system utilized Preoxygenation: Pre-oxygenation with 100% oxygen Intubation Type: IV induction Ventilation: Mask ventilation without difficulty LMA: LMA inserted LMA Size: 4.0 Number of attempts: 1 Placement Confirmation: positive ETCO2 and breath sounds checked- equal and bilateral Tube secured with: Tape Dental Injury: Teeth and Oropharynx as per pre-operative assessment

## 2016-01-28 NOTE — H&P (View-Only) (Signed)
   Daily Progress Note  Assessment/Planning: POD #1 s/p Ao, RRo for R leg ischemia, UTI   I had a long discussion with the patient in regards to the findings of the angiogram and possible options  Given the small targets, I doubt any meaningful long term patency for a fem-tib bypass in right leg, subsequently, I discussed proceeding with a R AKA with the patient.  She is considering proceeding with such. I discussed in depth the nature of right above-the-knee amputation with the patient, including risks, benefits, and alternatives.   The patient is aware that the risks of above-the-knee amputation include but are not limited to: bleeding, infection, myocardial infarction, stroke, death, failure to heal amputation wound, and possible need for more proximal amputation.   As I will be in clinic tomorrow, one of my partners will have to do the case.  I discussed with the patient, Dr. Donnetta Hutching. In regards to her UTI, I am giving her Macrobid 100 mg PO BID x 7 days  Subjective  - 1 Day Post-Op  Pain control so-so  Objective Filed Vitals:   01/26/16 1830 01/26/16 1940 01/27/16 0043 01/27/16 0603  BP:  148/61 150/59 119/97  Pulse:  99 97 100  Temp: 97.8 F (36.6 C) 98.1 F (36.7 C) 97.9 F (36.6 C) 97.9 F (36.6 C)  TempSrc:   Oral Oral  Resp:  16 17 19   Weight:      SpO2:  99% 100% 100%    Intake/Output Summary (Last 24 hours) at 01/27/16 1154 Last data filed at 01/26/16 1800  Gross per 24 hour  Intake 836.66 ml  Output      1 ml  Net 835.66 ml    PULM  CTAB CV  RRR GI  soft, NTND VASC  L groin without hematoma, R lower leg with frank skin ulceration, R foot appears ischemic  Laboratory CBC    Component Value Date/Time   WBC 7.4 01/27/2016 0533   HGB 11.7* 01/27/2016 0533   HCT 35.9* 01/27/2016 0533   PLT 172 01/27/2016 0533    BMET    Component Value Date/Time   NA 138 01/27/2016 0533   K 4.2 01/27/2016 0533   CL 103 01/27/2016 0533   CO2 27 01/27/2016 0533   GLUCOSE 93 01/27/2016 0533   BUN 11 01/27/2016 0533   CREATININE 0.71 01/27/2016 0533   CALCIUM 8.6* 01/27/2016 0533   GFRNONAA >60 01/27/2016 0533   GFRAA >60 01/27/2016 0533    Adele Barthel, MD [facility]: [phone] Pager: [phone]  01/27/2016, 11:54 AM

## 2016-01-28 NOTE — Progress Notes (Signed)
Report given to david rees rn as caregiver 

## 2016-01-28 NOTE — Op Note (Signed)
OPERATIVE REPORT  Date of Surgery: 01/26/2016 - 01/28/2016  Surgeon: Tinnie Gens, MD  Assistant: Gerri Lins PA  Pre-op Diagnosis: peripheral vascular disease with right leg ischemia I70.221  M62.89  Post-op Diagnosis: peripheral vascular disease with right leg ischemia I70.221  M62.89  Procedure: Procedure(s): AMPUTATION ABOVE KNEE RIGHT  Drains-2 Hemovac Anesthesia: General  EBL: [id number] cc  Complications: None  The patient was taken the operating room placed in the supine position at which time satisfactory general endotracheal anesthesia was ministered. The right leg was prepped with Betadine scrub and solution draped in routine sterile manner. The leg had been marked preoperatively for laterality purposes. Skin flaps were marked for an equal anterior and posterior flaps basing the femur about 6 inches above the knee joint. Incision was carried down through skin subcutaneous tissue with scalpel. Muscles divided with the Bovie. The femur was cleaned proximally with periosteal elevator divided with a Stryker saw and smoothed with a rasp. Superficial femoral artery was chronically occluded. The vein was ligated with 2-0 silk tie and ligature. The nerve was ligated with 2-0 silk tie and ligature and allowed to retract proximally. There was one femoral-popliteal graft which was transected which was thrombosed. Posterior muscles were divided with the Bovie and the specimen removed from the table. Adequate hemostasis was achieved. Medium Hemovac drain was brought out medially and laterally secured with silk sutures and fascia closed over the bone with interrupted 2-0 Vicryl skin in a subcuticular fashion with 2-0 Vicryl and skin staples sterile dressing applied patient taken to recovery room in satisfactory condition  Procedure Details:   Tinnie Gens, MD 01/28/2016 2:01 PM

## 2016-01-29 ENCOUNTER — Encounter (HOSPITAL_COMMUNITY): Payer: Self-pay | Admitting: Vascular Surgery

## 2016-01-29 DIAGNOSIS — Z89611 Acquired absence of right leg above knee: Secondary | ICD-10-CM

## 2016-01-29 LAB — BASIC METABOLIC PANEL
Anion gap: 8 (ref 5–15)
BUN: 7 mg/dL (ref 6–20)
CHLORIDE: 108 mmol/L (ref 101–111)
CO2: 25 mmol/L (ref 22–32)
CREATININE: 0.61 mg/dL (ref 0.44–1.00)
Calcium: 8.2 mg/dL — ABNORMAL LOW (ref 8.9–10.3)
GFR calc non Af Amer: >60 mL/min (ref >60)
Glucose, Bld: 144 mg/dL — ABNORMAL HIGH (ref 65–99)
Potassium: 4.1 mmol/L (ref 3.5–5.1)
Sodium: 141 mmol/L (ref 135–145)

## 2016-01-29 LAB — CBC
HCT: 31.2 % — ABNORMAL LOW (ref 36.0–46.0)
HEMOGLOBIN: 10.3 g/dL — AB (ref 12.0–15.0)
MCH: 29 pg (ref 26.0–34.0)
MCHC: 33 g/dL (ref 30.0–36.0)
MCV: 87.9 fL (ref 78.0–100.0)
PLATELETS: 147 10*3/uL — AB (ref 150–400)
RBC: 3.55 MIL/uL — AB (ref 3.87–5.11)
RDW: 12.9 % (ref 11.5–15.5)
WBC: 6.3 10*3/uL (ref 4.0–10.5)

## 2016-01-29 NOTE — Consult Note (Signed)
Physical Medicine and Rehabilitation Consult Reason for Consult: Right AKA Referring Physician: Dr. Bridgett Larsson   HPI: Briana Mosley is a 50 y.o. right handed female with history of chronic back pain, hypertension, tobacco abuse, peripheral vascular disease with history of multiple revascularization procedures. Patient lives alone in in [place]. Used a walker and sometimes wheelchair prior to admission. One level home 2 steps to entry. She has 2 nieces in the area that work Presented 01/27/2016 with ischemic right lower extremity. Findings of occluded right femoropopliteal graft. Limb was not felt to be salvageable and underwent right AKA 01/28/2016 per Dr. Kellie Simmering. Hospital course pain management. Subcutaneous Lovenox for DVT prophylaxis. Acute blood loss anemia 10.3 and monitored. Physical and occupational therapy evaluations pending. M.D. has requested physical medicine rehabilitation consult.   Review of Systems  Constitutional: Negative for fever and chills.  HENT: Negative for hearing loss.   Eyes: Negative for blurred vision and double vision.  Respiratory: Positive for cough. Negative for shortness of breath.   Cardiovascular: Positive for leg swelling. Negative for chest pain and palpitations.  Gastrointestinal: Positive for constipation. Negative for nausea and vomiting.  Genitourinary: Negative for dysuria and hematuria.  Musculoskeletal: Positive for back pain.  Skin: Negative for rash.  Neurological: Negative for seizures and headaches.  Psychiatric/Behavioral: The patient has insomnia.        Anxiety  All other systems reviewed and are negative.  Past Medical History  Diagnosis Date  . Chronic back pain   . Hypertension   . Thyroid disease     hyperthyroidism  . Lower extremity weakness   . Anxiety   . GERD (gastroesophageal reflux disease)     otc med if needed  . 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 Cholecystectomy    . Aortogram N/A 02/24/2015    Procedure: AORTOGRAM WITH BILATERAL LOWER EXTREMITY RUNOFF;  Surgeon: Conrad [place], MD;  Location: [facility];  Service: Vascular;  Laterality: N/A;  . Cardiac catheterization N/A 02/28/2015    Procedure: Left Heart Cath and Coronary Angiography;  Surgeon: Adrian Prows, MD;  Location: [facility];  Service: Cardiovascular;  Laterality: N/A;  . Femoral-popliteal bypass graft Right 03/06/2015    Procedure: RIGHT LEG FEMORAL-BELOW KNEE POPLITEAL ARTERY;  Surgeon: Conrad [place], MD;  Location: [facility];  Service: Vascular;  Laterality: Right;  . Femoral-popliteal bypass graft N/A 03/16/2015    Procedure: Exploration of ;  Surgeon: Rosetta Posner, MD;  Location: [facility];  Service: Vascular;  Laterality: N/A;  . Femoral artery exploration Right 03/16/2015    Procedure: Exploration of below the knee popliteal anastimosis, repair of femoral popliteal anastimosis, evacuation of hematoma right.;  Surgeon: Rosetta Posner, MD;  Location: [facility];  Service: Vascular;  Laterality: Right;  . Thoracic laminectomy  04/2010    w/tumor resetion/notes 04/19/2010  . Back surgery    . Aortogram N/A 01/26/2016    Procedure: ABDOMINAL AORTOGRAM  WITH  RIGHT LEG RUN OFF;  Surgeon: Conrad [place], MD;  Location: [facility] OR;  Service: Vascular;  Laterality: N/A;   Family History  Problem Relation Age of Onset  . Cancer Mother   . Heart disease Father   . Diabetes Sister    Social History:  reports that she has been smoking Cigarettes.  She has a 30 pack-year smoking history. She has never used smokeless tobacco. She reports that  she does not drink alcohol or use illicit drugs. Allergies:  Allergies  Allergen Reactions  . Ciprofloxacin Other (See Comments)    hallucination  . Sulfa Antibiotics Rash   Medications Prior to Admission  Medication Sig Dispense Refill  . aspirin EC 81 MG tablet Take 1 tablet (81 mg total) by mouth daily.    . clonazePAM (KLONOPIN)  0.5 MG tablet Take 0.5 mg by mouth 3 (three) times daily as needed for anxiety.    . magnesium oxide (MAG-OX) 400 MG tablet Take 400 mg by mouth daily.    . Oxycodone HCl 10 MG TABS Take 1 tablet (10 mg total) by mouth 4 (four) times daily as needed (pain). 30 tablet 0  . simvastatin (ZOCOR) 40 MG tablet Take 40 mg by mouth daily.  5    Home: Home Living Family/patient expects to be discharged to:: Unsure Living Arrangements: Alone  Functional History:   Functional Status:  Mobility:          ADL:    Cognition: Cognition Orientation Level: Oriented X4    Blood pressure 165/69, pulse 81, temperature 98.3 F (36.8 C), temperature source Oral, resp. rate 18, weight 63.504 kg (140 lb), last menstrual period 11/04/2014, SpO2 93 %. Physical Exam  Vitals reviewed. Constitutional: She is oriented to person, place, and time.  HENT:  Head: Normocephalic.  Eyes: EOM are normal.  Neck: Normal range of motion. Neck supple. No thyromegaly present.  Cardiovascular: Normal rate and regular rhythm.   Respiratory: Effort normal and breath sounds normal. No respiratory distress.  GI: Soft. Bowel sounds are normal. She exhibits no distension.  Musculoskeletal: She exhibits edema.  Neurological: She is alert and oriented to person, place, and time.  Mood is flat but appropriate. Oriented to person place and time. Follows simple commands. UE 5/5. Lifts LLE agst gravity. Did not test RLE due to pain.   Skin:  Amputation site is dressed appropriately tender  Psychiatric: She has a normal mood and affect. Her behavior is normal.    Results for orders placed or performed during the hospital encounter of 01/26/16 (from the past 24 hour(s))  Surgical pcr screen     Status: None   Collection Time: 01/28/16  6:07 AM  Result Value Ref Range   MRSA, PCR NEGATIVE NEGATIVE   Staphylococcus aureus NEGATIVE NEGATIVE  Basic metabolic panel     Status: Abnormal   Collection Time: 01/29/16  3:15 AM    Result Value Ref Range   Sodium 141 135 - 145 mmol/L   Potassium 4.1 3.5 - 5.1 mmol/L   Chloride 108 101 - 111 mmol/L   CO2 25 22 - 32 mmol/L   Glucose, Bld 144 (H) 65 - 99 mg/dL   BUN 7 6 - 20 mg/dL   Creatinine, Ser 0.61 0.44 - 1.00 mg/dL   Calcium 8.2 (L) 8.9 - 10.3 mg/dL   GFR calc non Af Amer >60 >60 mL/min   GFR calc Af Amer >60 >60 mL/min   Anion gap 8 5 - 15  CBC     Status: Abnormal   Collection Time: 01/29/16  3:15 AM  Result Value Ref Range   WBC 6.3 4.0 - 10.5 K/uL   RBC 3.55 (L) 3.87 - 5.11 MIL/uL   Hemoglobin 10.3 (L) 12.0 - 15.0 g/dL   HCT 31.2 (L) 36.0 - 46.0 %   MCV 87.9 78.0 - 100.0 fL   MCH 29.0 26.0 - 34.0 pg   MCHC 33.0 30.0 - 36.0  g/dL   RDW 12.9 11.5 - 15.5 %   Platelets 147 (L) 150 - 400 K/uL   No results found.  Assessment/Plan: Diagnosis: s/p right AKA 1. Does the need for close, 24 hr/day medical supervision in concert with the patient's rehab needs make it unreasonable for this patient to be served in a less intensive setting? Yes and Potentially 2. Co-Morbidities requiring supervision/potential complications: htn, CAS, PAD 3. Due to bladder management, bowel management, safety, skin/wound care, disease management, medication administration, pain management and patient education, does the patient require 24 hr/day rehab nursing? Yes 4. Does the patient require coordinated care of a physician, rehab nurse, PT (1-2 hrs/day, 5 days/week) and OT (1-2 hrs/day, 5 days/week) to address physical and functional deficits in the context of the above medical diagnosis(es)? Yes and Potentially Addressing deficits in the following areas: balance, endurance, locomotion, strength, transferring, bowel/bladder control, bathing, dressing, feeding, grooming, toileting and psychosocial support 5. Can the patient actively participate in an intensive therapy program of at least 3 hrs of therapy per day at least 5 days per week? Yes 6. The potential for patient to make  measurable gains while on inpatient rehab is excellent 7. Anticipated functional outcomes upon discharge from inpatient rehab are modified independent  with PT, modified independent with OT, n/a with SLP. 8. Estimated rehab length of stay to reach the above functional goals is: potentially a week 9. Does the patient have adequate social supports and living environment to accommodate these discharge functional goals? Yes and Potentially 10. Anticipated D/C setting: Home 11. Anticipated post D/C treatments: HH therapy and Outpatient therapy 12. Overall Rehab/Functional Prognosis: excellent  RECOMMENDATIONS: This patient's condition is appropriate for continued rehabilitative care in the following setting: CIR vs Home Health Patient has agreed to participate in recommended program. No and Potentially Note that insurance prior authorization may be required for reimbursement for recommended care.  Comment: Pt seems determined to go home. Does seem to have home set up to accommodate a wheelchair. Has aid/family around too. Will follow along for progress.  Meredith Staggers, MD, [facility] 01/29/2016     01/29/2016

## 2016-01-29 NOTE — Progress Notes (Signed)
Rehab admissions - I met with patient and she prefers to discharge directly home with [facility] rather than come to inpatient rehab.  I will check back with her in am.  Call me for questions.  #[phone]

## 2016-01-29 NOTE — Evaluation (Signed)
Physical Therapy Evaluation Patient Details Name: AKI THOMLINSON MRN: [id number] DOB: 1966/06/27 Today's Date: 01/29/2016   History of Present Illness  pt presents post R AKA.  pt with hx of Chronic Back Pain, Back Surgery, HTN, Hyperthyroidism, and Anxiety.    Clinical Impression  Upon PT arrival pt soaked with urine and admits she did not let anyone know she was wet all night.  Pt states she knows when she urinates, but just uses her brief.  At this time pt is requiring A for all aspects of mobility and care.  Pt has an aide, but does not have 24hr A.  Pt will need 24hr A for a safe return to home, which is where pt wants to D/C to.  Feel pt would benefit from CIR level of therapy at D/C to maximize independence and decrease burden of care.      Follow Up Recommendations CIR    Equipment Recommendations  None recommended by PT    Recommendations for Other Services Rehab consult     Precautions / Restrictions Precautions Precautions: Fall Restrictions Weight Bearing Restrictions: No      Mobility  Bed Mobility Overal bed mobility: Needs Assistance Bed Mobility: Supine to Sit     Supine to sit: Min assist;HOB elevated     General bed mobility comments: pt utilizes bed rails and HOB elevated to complete with MinA for trunk.    Transfers Overall transfer level: Needs assistance Equipment used: None Transfers: Sit to/from [employer] Sit to Stand: Mod assist   Squat pivot transfers: Mod assist     General transfer comment: Attempted to come to stand to finish donning brief, however pt only able to come to squat on L LE and needed ModA.  pt instructed PT where to place chair to simulate her home set-up and pt still needed [place] for squat pivot to recliner.    Ambulation/Gait                Stairs            Wheelchair Mobility    Modified Rankin (Stroke Patients Only)       Balance Overall balance assessment: Needs  assistance Sitting-balance support: Bilateral upper extremity supported;Feet supported Sitting balance-Leahy Scale: Poor Sitting balance - Comments: Needs UE support to maintain balance.   Standing balance support: During functional activity;Bilateral upper extremity supported Standing balance-Leahy Scale: Zero                               Pertinent Vitals/Pain Pain Assessment: 0-10 Pain Score: 6  Pain Location: Tight in residual limb Pain Descriptors / Indicators: Tightness Pain Intervention(s): Monitored during session;Premedicated before session;Repositioned    Home Living Family/patient expects to be discharged to:: Unsure Living Arrangements: Alone Available Help at Discharge: Family;Personal care attendant;Available PRN/intermittently (Aide 5 days/wk for a few hrs per day. Family on weekends.) Type of Home: Mobile home Home Access: Stairs to enter Entrance Stairs-Rails: Right Entrance Stairs-Number of Steps: 2 Home Layout: One level Home Equipment: Tub bench;Walker - 2 wheels;Bedside commode;Wheelchair - manual;Hospital bed      Prior Function Level of Independence: Needs assistance   Gait / Transfers Assistance Needed: pt states she was independent with transfer to W/C and 3-in-1.  ADL's / Homemaking Assistance Needed: Aide performs bathing and set-up for dressing. Aide A with homemaking tasks.          Hand Dominance   Dominant  Hand: Right    Extremity/Trunk Assessment   Upper Extremity Assessment: Defer to OT evaluation           Lower Extremity Assessment: Generalized weakness;RLE deficits/detail;LLE deficits/detail RLE Deficits / Details: pt able to actively move R residual limb, but with hypersensitivities. LLE Deficits / Details: Generally weak 3/5 with decreased proprioception and soft touch sensation.  pt states her LE feels fine, but is unable to tell PT where her LE is and is unsure if it is touching the floor.    Cervical / Trunk  Assessment: Kyphotic  Communication   Communication: No difficulties  Cognition Arousal/Alertness: Awake/alert Behavior During Therapy: WFL for tasks assessed/performed Overall Cognitive Status: Within Functional Limits for tasks assessed                      General Comments      Exercises        Assessment/Plan    PT Assessment Patient needs continued PT services  PT Diagnosis Generalized weakness   PT Problem List Decreased strength;Decreased activity tolerance;Decreased balance;Decreased mobility;Decreased coordination;Decreased knowledge of use of DME;Pain;Impaired sensation  PT Treatment Interventions DME instruction;Functional mobility training;Therapeutic activities;Therapeutic exercise;Balance training;Patient/family education   PT Goals (Current goals can be found in the Care Plan section) Acute Rehab PT Goals Patient Stated Goal: Home PT Goal Formulation: With patient Time For Goal Achievement: 02/12/16 Potential to Achieve Goals: Good    Frequency Min 3X/week   Barriers to discharge Decreased caregiver support;Inaccessible home environment pt has 2 step to enter her home and does not have 24/7 A.      Co-evaluation               End of Session Equipment Utilized During Treatment: Gait belt Activity Tolerance: Patient limited by fatigue Patient left: in chair;with call bell/phone within reach Nurse Communication: Mobility status         Time: [id number] PT Time Calculation (min) (ACUTE ONLY): 41 min   Charges:   PT Evaluation $PT Eval Moderate Complexity: 1 Procedure PT Treatments $Therapeutic Activity: 23-37 mins   PT G CodesCatarina Hartshorn, [place] [id number] 01/29/2016, 9:37 AM

## 2016-01-29 NOTE — Anesthesia Postprocedure Evaluation (Signed)
Anesthesia Post Note  Patient: Briana Mosley  Procedure(s) Performed: Procedure(s) (LRB): AMPUTATION ABOVE KNEE RIGHT (Right)  Patient location during evaluation: PACU Anesthesia Type: General Level of consciousness: awake and alert and patient cooperative Pain management: pain level controlled Vital Signs Assessment: post-procedure vital signs reviewed and stable Respiratory status: spontaneous breathing and respiratory function stable Cardiovascular status: stable Anesthetic complications: no    Last Vitals:  Filed Vitals:   01/29/16 0443 01/29/16 1300  BP: 165/69 153/71  Pulse: 81 90  Temp: 36.8 C 36.9 C  Resp: 18 20    Last Pain:  Filed Vitals:   01/29/16 1351  PainSc: 3                  Vastie Douty S

## 2016-01-29 NOTE — Evaluation (Signed)
Occupational Therapy Evaluation Patient Details Name: Briana Mosley MRN: [id number] DOB: 1966/05/30 Today's Date: 01/29/2016    History of Present Illness pt presents post R AKA.  pt with hx of Chronic Back Pain, Back Surgery, HTN, Hyperthyroidism, and Anxiety.     Clinical Impression   Pt admitted with the above diagnosis and has the deficits listed below. Pt would benefit from cont OT to increase independence in all areas of adls and adl transfers. Pt lives alone and has an aid 5x week for 6 hours.  This is not enough assist at home for this pt at he current level.  Pt is a high fall risk at this point and dc home would be unsafe.  Feel pt would benefit from CIR prior to returning home to maximize independence so she could transfer home with her aid.  Pt would like to d/c straight home, but has been encouraged to consider other options as her personal safety is at risk with that plan.     Follow Up Recommendations  CIR    Equipment Recommendations  None recommended by OT    Recommendations for Other Services Rehab consult     Precautions / Restrictions Precautions Precautions: Fall Restrictions Weight Bearing Restrictions: No      Mobility Bed Mobility Overal bed mobility: Needs Assistance Bed Mobility: Supine to Sit     Supine to sit: Min assist;HOB elevated     General bed mobility comments: pt utilizes bed rails and HOB elevated to complete with MinA for trunk.    Transfers Overall transfer level: Needs assistance Equipment used: None Transfers: Sit to/from [employer] Sit to Stand: Mod assist;+2 physical assistance Stand pivot transfers: Mod assist Squat pivot transfers: Mod assist     General transfer comment: Pt required +2 to get into full standing.  Pt states that she knows how to do everything and will be fine going home.  Pt erquired +2 assist to get up and pt still unable to see that going home alone will be very dangerous.     Balance Overall balance assessment: Needs assistance Sitting-balance support: Bilateral upper extremity supported;Feet supported Sitting balance-Leahy Scale: Poor Sitting balance - Comments: Needs UE support to maintain balance.   Standing balance support: Bilateral upper extremity supported;During functional activity Standing balance-Leahy Scale: Zero Standing balance comment: Pt must have outside assist to stand.                            ADL Overall ADL's : Needs assistance/impaired Eating/Feeding: Independent;Sitting   Grooming: Wash/dry hands;Wash/dry face;Oral care;Set up;Sitting   Upper Body Bathing: Set up;Sitting   Lower Body Bathing: Moderate assistance;Sit to/from stand;Cueing for safety Lower Body Bathing Details (indicate cue type and reason): Pt sat in chair to bathe.  Pt unable to stand at this point without +2 assist.   Upper Body Dressing : Set up;Sitting   Lower Body Dressing: Moderate assistance;Sit to/from stand;Cueing for safety Lower Body Dressing Details (indicate cue type and reason): Pt dressed from chair with mod assist.  Pt needs +2 assist to get into standing.  When pt powers up, pt has hard time knowing when L leg is locked out and supporting her so that knee tends to buckle. Toilet Transfer: Moderate assistance;Stand-pivot;BSC;RW Toilet Transfer Details (indicate cue type and reason): Pt pivoted to strong side.  Pt unaware at times of where her L foot is which makes transfers unsafe and makes pt a fall risk.  Toileting- Clothing Manipulation and Hygiene: Total assistance;Sit to/from stand Toileting - Clothing Manipulation Details (indicate cue type and reason): Pt transferred to standing with +2 assist and when standing therapist managed clothing.     Functional mobility during ADLs: Moderate assistance;Rolling walker General ADL Comments: Pt requires a great amount of assist for adls.  Pt had been in chair for 10 minutes and chair pad was  soaked but pt was unaware that she had urinated.  Concerns lie in area of infection.  If pt goes home, this urine will run down her leg and into her stump.  She states this does not happen at home, only here and when she is at home she changes her depends and can feel when she has to go to the bathroom.       Vision Vision Assessment?: No apparent visual deficits   Perception     Praxis      Pertinent Vitals/Pain Pain Assessment: 0-10 Pain Score: 6  Pain Location: Residual limb Pain Descriptors / Indicators: Tightness Pain Intervention(s): Monitored during session;Patient requesting pain meds-RN notified;Limited activity within patient's tolerance;Repositioned     Hand Dominance Right   Extremity/Trunk Assessment Upper Extremity Assessment Upper Extremity Assessment: Overall WFL for tasks assessed   Lower Extremity Assessment Lower Extremity Assessment: Defer to PT evaluation RLE Deficits / Details: pt able to actively move R residual limb, but with hypersensitivities. LLE Deficits / Details: Generally weak 3/5 with decreased proprioception and soft touch sensation.  pt states her LE feels fine, but is unable to tell PT where her LE is and is unsure if it is touching the floor.   LLE Sensation: decreased light touch;decreased proprioception LLE Coordination: decreased fine motor   Cervical / Trunk Assessment Cervical / Trunk Assessment: Kyphotic   Communication Communication Communication: No difficulties   Cognition Arousal/Alertness: Awake/alert Behavior During Therapy: WFL for tasks assessed/performed Overall Cognitive Status: Within Functional Limits for tasks assessed                     General Comments       Exercises       Shoulder Instructions      Home Living Family/patient expects to be discharged to:: Unsure Living Arrangements: Alone Available Help at Discharge: Family;Personal care attendant;Available PRN/intermittently Type of Home: Mobile  home Home Access: Stairs to enter Entrance Stairs-Number of Steps: 2 Entrance Stairs-Rails: Right Home Layout: One level     Bathroom Shower/Tub: Tub/shower unit;Curtain Shower/tub characteristics: Architectural technologist: Standard     Home Equipment: Tub bench;Walker - 2 wheels;Bedside commode;Wheelchair - manual;Hospital bed          Prior Functioning/Environment Level of Independence: Needs assistance  Gait / Transfers Assistance Needed: pt states she was independent with transfer to W/C and 3-in-1. ADL's / Homemaking Assistance Needed: Aide performs bathing and set-up for dressing. Aide A with homemaking tasks.     Comments: Pt reports working with HHPT and walking with her PT from 1 side of the house to the other. Uses w/c otherwise. "I can for myself" Son assists with IADLs.    OT Diagnosis: Generalized weakness;Acute pain   OT Problem List: Decreased strength;Decreased activity tolerance;Impaired balance (sitting and/or standing);Decreased safety awareness;Decreased knowledge of use of DME or AE;Decreased knowledge of precautions;Pain   OT Treatment/Interventions: Self-care/ADL training;DME and/or AE instruction;Energy conservation;Therapeutic activities    OT Goals(Current goals can be found in the care plan section) Acute Rehab OT Goals Patient Stated Goal: Home OT Goal Formulation: With  patient Time For Goal Achievement: 02/12/16 Potential to Achieve Goals: Good ADL Goals Pt Will Perform Lower Body Bathing: with min guard assist;sit to/from stand Pt Will Perform Lower Body Dressing: with min guard assist;sit to/from stand Pt Will Perform Tub/Shower Transfer: Tub transfer;ambulating;tub bench;with min assist;rolling walker Additional ADL Goal #1: Pt will transfer to [facility] and toilet with min guard assist.  OT Frequency: Min 3X/week   Barriers to D/C: Decreased caregiver support  Pt lives alone with an aid 6 hours a day.       Co-evaluation               End of Session Equipment Utilized During Treatment: Surveyor, mining Communication: Mobility status  Activity Tolerance: Patient limited by pain Patient left: in chair;with call bell/phone within reach;with nursing/sitter in room   Time: 0940-1007 OT Time Calculation (min): 27 min Charges:  OT General Charges $OT Visit: 1 Procedure OT Evaluation $OT Eval Moderate Complexity: 1 Procedure OT Treatments $Self Care/Home Management : 8-22 mins G-Codes:    Glenford Peers Feb 02, 2016, 10:22 AM  [phone]

## 2016-01-29 NOTE — Progress Notes (Addendum)
  Progress Note    01/29/2016 7:53 AM 1 Day Post-Op  Subjective:  Pain is controlled  Afebrile HR 80's-100 NSR/ST 0000000 systolic Q000111Q RA   Filed Vitals:   01/28/16 2025 01/29/16 0443  BP: 141/61 165/69  Pulse: 89 81  Temp: 98.4 F (36.9 C) 98.3 F (36.8 C)  Resp: 18 18    Physical Exam: Incisions:  Right amp site is wrapped and dry.  Drains are in place.   CBC    Component Value Date/Time   WBC 6.3 01/29/2016 0315   RBC 3.55* 01/29/2016 0315   HGB 10.3* 01/29/2016 0315   HCT 31.2* 01/29/2016 0315   PLT 147* 01/29/2016 0315   MCV 87.9 01/29/2016 0315   MCH 29.0 01/29/2016 0315   MCHC 33.0 01/29/2016 0315   RDW 12.9 01/29/2016 0315   LYMPHSABS 1.5 03/16/2015 1400   MONOABS 0.7 03/16/2015 1400   EOSABS 0.1 03/16/2015 1400   BASOSABS 0.0 03/16/2015 1400    BMET    Component Value Date/Time   NA 141 01/29/2016 0315   K 4.1 01/29/2016 0315   CL 108 01/29/2016 0315   CO2 25 01/29/2016 0315   GLUCOSE 144* 01/29/2016 0315   BUN 7 01/29/2016 0315   CREATININE 0.61 01/29/2016 0315   CALCIUM 8.2* 01/29/2016 0315   GFRNONAA >60 01/29/2016 0315   GFRAA >60 01/29/2016 0315    INR    Component Value Date/Time   INR 1.13 02/28/2015 0306     Intake/Output Summary (Last 24 hours) at 01/29/16 0753 Last data filed at 01/29/16 0636  Gross per 24 hour  Intake 2373.75 ml  Output    230 ml  Net 2143.75 ml     Assessment/Plan:  50 y.o. female is s/p right above knee amputation  1 Day Post-Op  -stump is wrapped with ace wrap and is clean and dry -hemovac with 80cc out total -will remove bandages & drains tomorrow -will need to consult biotech for retention sock and prosthesis information   Leontine Locket, PA-C [facility] [phone] 01/29/2016 7:53 AM   Addendum  I agree with the physician assistant's findings.  Drain out once <50 cc.  Dressing off tomorrow.  Think pt would benefit from [place], MD [facility] [place] Office: [phone] Pager: [phone]  01/29/2016, 8:10 PM

## 2016-01-29 NOTE — Care Management Important Message (Signed)
Important Message  Patient Details  Name: Briana Mosley MRN: [id number] Date of Birth: Feb 13, 1966   Medicare Important Message Given:  Yes    Nathen May 01/29/2016, 1:36 PM

## 2016-01-29 NOTE — Care Management Note (Signed)
Case Management Note Marvetta Gibbons RN, BSN Unit 2W-Case Manager [phone]  Patient Details  Name: Briana Mosley MRN: [id number] Date of Birth: 03/28/1966  Subjective/Objective:  Pt admitted with foot ischemia s/p AKA                  Action/Plan: PTA pt lived at home alone- has an aide that comes M-F about 6hrs/day- per PT/OT recommendations CIR consulted- per pt she wants to go home with [facility]- spoke with pt at bedside- per conversation pt reports that her aide is there during day and then her neighbors can assist if needed at night- she reports that she has all needed DME- that includes w/c, hospital bed, elevated toilet seat, shoer chair , lift chair, and grab bars in bathroom. Pt states that she feels like she has the support she needs at home. Discussed that the recommendations are for 24/7 and she does not have that - and that the recommendation is for CIR to maximize independent prior to returning home- pt still states that she understands this but feels like she can manage at home and wants to return home with [facility]- she has used [facility] in past and prefers them as [facility] agency of choice. If MD agrees with d/c to home- will need [place] orders with F2[facility] for HHRN/PT/OT- CM to f/u in the am - have asked pt to think about CIR as safest discharge option.   Expected Discharge Date:                  Expected Discharge Plan:  [place]  In-House Referral:  Clinical Social Work  Discharge planning Services  CM Consult  Post Acute Care Choice:  Home Health Choice offered to:  Patient  DME Arranged:    DME Agency:     HH Arranged:    [place] Agency:  [place]  Status of Service:  In process, will continue to follow  Medicare Important Message Given:  Yes Date Medicare IM Given:    Medicare IM give by:    Date Additional Medicare IM Given:    Additional Medicare Important Message give by:     If discussed at [place] of Stay Meetings, dates discussed:     Additional Comments:  Dawayne Patricia, RN 01/29/2016, 2:31 PM

## 2016-01-30 ENCOUNTER — Ambulatory Visit: Payer: Medicare Other | Admitting: Vascular Surgery

## 2016-01-30 ENCOUNTER — Ambulatory Visit (HOSPITAL_COMMUNITY): Payer: Medicare Other

## 2016-01-30 LAB — BASIC METABOLIC PANEL
Anion gap: 7 (ref 5–15)
BUN: 7 mg/dL (ref 6–20)
CHLORIDE: 109 mmol/L (ref 101–111)
CO2: 27 mmol/L (ref 22–32)
Calcium: 8.3 mg/dL — ABNORMAL LOW (ref 8.9–10.3)
Creatinine, Ser: 0.72 mg/dL (ref 0.44–1.00)
GFR calc Af Amer: >60 mL/min (ref >60)
GFR calc non Af Amer: >60 mL/min (ref >60)
GLUCOSE: 108 mg/dL — AB (ref 65–99)
POTASSIUM: 3.5 mmol/L (ref 3.5–5.1)
Sodium: 143 mmol/L (ref 135–145)

## 2016-01-30 LAB — CBC
HEMATOCRIT: 30.7 % — AB (ref 36.0–46.0)
Hemoglobin: 10.3 g/dL — ABNORMAL LOW (ref 12.0–15.0)
MCH: 29.3 pg (ref 26.0–34.0)
MCHC: 33.6 g/dL (ref 30.0–36.0)
MCV: 87.5 fL (ref 78.0–100.0)
Platelets: 151 10*3/uL (ref 150–400)
RBC: 3.51 MIL/uL — ABNORMAL LOW (ref 3.87–5.11)
RDW: 13.2 % (ref 11.5–15.5)
WBC: 6.8 10*3/uL (ref 4.0–10.5)

## 2016-01-30 MED ORDER — NICOTINE 21 MG/24HR TD PT24: 21.0000 mg | MEDICATED_PATCH | Freq: Every day | TRANSDERMAL | Status: AC

## 2016-01-30 MED ORDER — ACETAMINOPHEN-CODEINE #3 300-30 MG PO TABS: 1.0000 | ORAL_TABLET | Freq: Four times a day (QID) | ORAL | Status: AC | PRN

## 2016-01-30 MED ORDER — NITROFURANTOIN MONOHYD MACRO 100 MG PO CAPS: 100.0000 mg | ORAL_CAPSULE | Freq: Two times a day (BID) | ORAL | Status: AC

## 2016-01-30 NOTE — Progress Notes (Addendum)
  Progress Note    01/30/2016 8:09 AM 2 Days Post-Op  Subjective:  No complaints-really wants to go home vs CIR  Afebrile HR  70's-120's NSR/ST (70's this morning) [id number] systolic [id number] RA   Filed Vitals:   01/30/16 0450 01/30/16 0512  BP: 165/66 156/55  Pulse: 75 77  Temp:    Resp:      Physical Exam: Incisions:  Looks good-staples are in tact and clean and dry. Extremities:  Good ROM  CBC    Component Value Date/Time   WBC 6.8 01/30/2016 0442   RBC 3.51* 01/30/2016 0442   HGB 10.3* 01/30/2016 0442   HCT 30.7* 01/30/2016 0442   PLT 151 01/30/2016 0442   MCV 87.5 01/30/2016 0442   MCH 29.3 01/30/2016 0442   MCHC 33.6 01/30/2016 0442   RDW 13.2 01/30/2016 0442   LYMPHSABS 1.5 03/16/2015 1400   MONOABS 0.7 03/16/2015 1400   EOSABS 0.1 03/16/2015 1400   BASOSABS 0.0 03/16/2015 1400    BMET    Component Value Date/Time   NA 143 01/30/2016 0442   K 3.5 01/30/2016 0442   CL 109 01/30/2016 0442   CO2 27 01/30/2016 0442   GLUCOSE 108* 01/30/2016 0442   BUN 7 01/30/2016 0442   CREATININE 0.72 01/30/2016 0442   CALCIUM 8.3* 01/30/2016 0442   GFRNONAA >60 01/30/2016 0442   GFRAA >60 01/30/2016 0442    INR    Component Value Date/Time   INR 1.13 02/28/2015 0306     Intake/Output Summary (Last 24 hours) at 01/30/16 0809 Last data filed at 01/30/16 [id number]  Gross per 24 hour  Intake   1650 ml  Output     40 ml  Net   1610 ml     Assessment/Plan:  50 y.o. female is s/p right above knee amputation  2 Days Post-Op  -stump is viable -pt does have good ROM with her stump -drain with 40cc out-dressing removed and drains out-wound looks good. -will ask biotech to bring a retention sock -encouraged her to at least consider CIR and hear what they have to say.  I feel she could really benefit from their services.  I explained to her that if she falls on the stump, it could be a real problem and possibly need further surgery.  She says she will think about  it.   Leontine Locket, PA-C [facility] [phone] 01/30/2016 8:09 AM  Addendum  I also agree that she would be better served with CIR. Will be by later today to discuss with patient.   Adele Barthel, MD [facility] [place] Office: [phone] Pager: [phone]  01/30/2016, 10:13 AM   Addendum  R AKA stump viable.  Pain much better after AKA.  Discussed with pt my rationale for CIR but patient still prefers to do her rehab at home.  Will discharge patient home today with services  Adele Barthel, MD [facility] [place] Office: [phone] Pager: [phone]  01/30/2016, 4:06 PM

## 2016-01-30 NOTE — Progress Notes (Signed)
Occupational Therapy Treatment Patient Details Name: Briana Mosley MRN: [id number] DOB: 14-Nov-1965 Today's Date: 01/30/2016    History of present illness pt presents post R AKA.  pt with hx of Chronic Back Pain, Back Surgery, HTN, Hyperthyroidism, and Anxiety.     OT comments  Patient continues to not want to go to rehab, despite needing assistance with all ADLs and mobility at this time. OT will continue to follow.  Follow Up Recommendations  CIR;Other (comment) -- HHOT if she goes home   Equipment Recommendations  Other (comment) (drop arm BSC)    Recommendations for Other Services      Precautions / Restrictions Precautions Precautions: Fall Precaution Comments: R AKA Restrictions Weight Bearing Restrictions: No       Mobility Bed Mobility Overal bed mobility: Needs Assistance Bed Mobility: Supine to Sit;Sit to Supine     Supine to sit: Supervision;HOB elevated Sit to supine: Supervision;HOB elevated   General bed mobility comments: utlized bed rails. Patient reports she has hospital bed at home.  Transfers Overall transfer level: Needs assistance Equipment used: None Transfers: Sit to/from Stand Sit to Stand: Mod assist         General transfer comment: Patient did sit to partial stands x 3 with OT, then practiced scooting R/L along EOB. Refused OOB to chair at this time.     Balance                                   ADL Overall ADL's : Needs assistance/impaired Eating/Feeding: Independent;Bed level   Grooming: Wash/dry hands;Wash/dry face;Supervision/safety;Sitting Grooming Details (indicate cue type and reason): sitting EOB               Lower Body Dressing Details (indicate cue type and reason): donned and doff L shoe only with setup in bed             Functional mobility during ADLs: Moderate assistance (sit to partial stands, scooting along bed) General ADL Comments: Patient remains adamant about not wanting CIR.  Even with lengthy explanations, she does not understand that she needs 24/7 A at this time until she can improve her ability to transfer. Patient practiced sit to partial stands with mod A from EOB. Also discussed scooting transfers with patient to w/c. Patient reports she has sliding board at home. Patient practiced scooting along EOB to the right and back to the left. She required mod A towards R with increased effort, but did better scooting left.       Vision                     Perception     Praxis      Cognition   Behavior During Therapy: WFL for tasks assessed/performed Overall Cognitive Status: Within Functional Limits for tasks assessed                       Extremity/Trunk Assessment               Exercises     Shoulder Instructions       General Comments      Pertinent Vitals/ Pain       Pain Assessment: 0-10 Pain Score: 5  Pain Location: residual limb, back Pain Descriptors / Indicators: Sore Pain Intervention(s): Monitored during session;Repositioned  Home Living  Prior Functioning/Environment              Frequency Min 3X/week     Progress Toward Goals  OT Goals(current goals can now be found in the care plan section)  Progress towards OT goals: Progressing toward goals     Plan Discharge plan remains appropriate    Co-evaluation                 End of Session     Activity Tolerance Patient tolerated treatment well   Patient Left in bed;with call bell/phone within reach   Nurse Communication Mobility status        Time: [id number] OT Time Calculation (min): 16 min  Charges: OT General Charges $OT Visit: 1 Procedure OT Treatments $Self Care/Home Management : 8-22 mins  Laisa Larrick A 01/30/2016, 10:06 AM

## 2016-01-30 NOTE — Progress Notes (Signed)
Rehab admissions - I met with patient and spoke with her extensively about inpatient rehab.  Patient continues to tell me that she prefers to discharge home with [facility] therapies.  She does have an aide 5 days a week and can increase hours as needed (per patient).  I do have bed availability today, but I am unable to change patient's discharge about discharging home directly from hospital.  Call me for questions.  #[phone]

## 2016-01-30 NOTE — Care Management Note (Signed)
Case Management Note Marvetta Gibbons RN, BSN Unit 2W-Case Manager [phone]  Patient Details  Name: Briana Mosley MRN: [id number] Date of Birth: Jan 28, 1966  Subjective/Objective:  Pt admitted with foot ischemia s/p AKA                  Action/Plan: PTA pt lived at home alone- has an aide that comes M-F about 6hrs/day- per PT/OT recommendations CIR consulted- per pt she wants to go home with [facility]- spoke with pt at bedside- per conversation pt reports that her aide is there during day and then her neighbors can assist if needed at night- she reports that she has all needed DME- that includes w/c, hospital bed, elevated toilet seat, shoer chair , lift chair, and grab bars in bathroom. Pt states that she feels like she has the support she needs at home. Discussed that the recommendations are for 24/7 and she does not have that - and that the recommendation is for CIR to maximize independent prior to returning home- pt still states that she understands this but feels like she can manage at home and wants to return home with [facility]- she has used [facility] in past and prefers them as [facility] agency of choice. If MD agrees with d/c to home- will need [place] orders with [facility] for HHRN/PT/OT- CM to f/u in the am - have asked pt to think about CIR as safest discharge option.   Expected Discharge Date:   01/30/16               Expected Discharge Plan:  [employer]  In-House Referral:  Clinical Social Work  Discharge planning Services  CM Consult  Post Acute Care Choice:  Home Health Choice offered to:  Patient  DME Arranged:    DME Agency:     HH Arranged:  PT, OT HH Agency:  [place]  Status of Service:  Completed, signed off  Medicare Important Message Given:  Yes Date Medicare IM Given:    Medicare IM give by:    Date Additional Medicare IM Given:    Additional Medicare Important Message give by:     If discussed at [place] of Stay Meetings, dates discussed:     Additional Comments:  01/30/16- 1450- Marvetta Gibbons Rn, BSN- pt continues to refuse to go to rehab- wants to go home with [facility] although all feel that rehab would be the safest plan for pt prior to returning home to maximize independence- orders have been placed for HH-PT/OT- referral called to [facility] with [facility] per pt choice- for HH-services- PT/OT   Dawayne Patricia, RN 01/30/2016, 2:50 PM

## 2016-01-30 NOTE — Progress Notes (Signed)
Orthopedic Tech Progress Note Patient Details:  Briana Mosley October 19, 1965 [id number]  Patient ID: Epifanio Lesches, female   DOB: 05-21-1966, 50 y.o.   MRN: [id number]   [place] Pink 01/30/2016, 9:02 [facility] for right retention sock.

## 2016-01-30 NOTE — Progress Notes (Signed)
Physical Therapy Treatment Patient Details Name: Briana Mosley MRN: [id number] DOB: Nov 24, 1965 Today's Date: 01/30/2016    History of Present Illness pt presents post R AKA.  pt with hx of Chronic Back Pain, Back Surgery, HTN, Hyperthyroidism, and Anxiety.      PT Comments    Pt is not able to get OOB without 1-2 person A.  Pt continues to rely on staff for mobility, toileting, and hygiene, yet insists she will be able to do it herself at home.  Continue to feel pt is not safe for return home and would greatly benefit from CIR level of therapies to maximize independence.    Follow Up Recommendations  CIR     Equipment Recommendations  None recommended by PT    Recommendations for Other Services       Precautions / Restrictions Precautions Precautions: Fall Precaution Comments: R AKA Restrictions Weight Bearing Restrictions: No    Mobility  Bed Mobility Overal bed mobility: Needs Assistance Bed Mobility: Supine to Sit     Supine to sit: Supervision;HOB elevated Sit to supine: Supervision;HOB elevated   General bed mobility comments: Heavy reliance on bed rails, but was able to complete without A today.    Transfers Overall transfer level: Needs assistance Equipment used: Rolling walker (2 wheeled) Transfers: Sit to/from [facility] Sit to Stand: Mod assist Stand pivot transfers: Mod assist;+2 physical assistance       General transfer comment: pt able to come to standing with RW and ModA today, but needs 2 person A for safety with pivot to recliner as pt tries to sit prematurely due to being too fatigued to remain standing.  2 person A to complete pivot to recliner.    Ambulation/Gait                 Stairs            Wheelchair Mobility    Modified Rankin (Stroke Patients Only)       Balance Overall balance assessment: Needs assistance Sitting-balance support: Bilateral upper extremity supported;Single extremity  supported;Feet supported Sitting balance-Leahy Scale: Fair Sitting balance - Comments: pt was able to don her shoe today with single UE support.   Standing balance support: Bilateral upper extremity supported;During functional activity Standing balance-Leahy Scale: Zero                      Cognition Arousal/Alertness: Awake/alert Behavior During Therapy: WFL for tasks assessed/performed Overall Cognitive Status: Within Functional Limits for tasks assessed                      Exercises      General Comments        Pertinent Vitals/Pain Pain Assessment: 0-10 Pain Score: 5  Pain Location: Residual limb Pain Descriptors / Indicators: Sore Pain Intervention(s): Monitored during session;Premedicated before session;Repositioned    Home Living                      Prior Function            PT Goals (current goals can now be found in the care plan section) Acute Rehab PT Goals Patient Stated Goal: Home PT Goal Formulation: With patient Time For Goal Achievement: 02/12/16 Potential to Achieve Goals: Good Progress towards PT goals: Progressing toward goals    Frequency  Min 3X/week    PT Plan Current plan remains appropriate    Co-evaluation  End of Session Equipment Utilized During Treatment: Gait belt Activity Tolerance: Patient limited by fatigue Patient left: in chair;with call bell/phone within reach;with nursing/sitter in room     Time: 0950-1003 PT Time Calculation (min) (ACUTE ONLY): 13 min  Charges:  $Therapeutic Activity: 8-22 mins                    G CodesCatarina Hartshorn, [place] 01/30/2016, 10:34 AM

## 2016-01-31 LAB — TYPE AND SCREEN
ABO/RH(D): A NEG
Antibody Screen: POSITIVE
DAT, IGG: NEGATIVE
PT AG TYPE: NEGATIVE
UNIT DIVISION: 0
UNIT DIVISION: 0

## 2016-02-02 ENCOUNTER — Telehealth: Payer: Self-pay | Admitting: Vascular Surgery

## 2016-02-02 DIAGNOSIS — E0580 Other thyrotoxicosis without thyrotoxic crisis or storm: Secondary | ICD-10-CM | POA: Diagnosis not present

## 2016-02-02 DIAGNOSIS — I10 Essential (primary) hypertension: Secondary | ICD-10-CM | POA: Diagnosis not present

## 2016-02-02 DIAGNOSIS — Z89611 Acquired absence of right leg above knee: Secondary | ICD-10-CM | POA: Diagnosis not present

## 2016-02-02 DIAGNOSIS — Z7982 Long term (current) use of aspirin: Secondary | ICD-10-CM | POA: Diagnosis not present

## 2016-02-02 DIAGNOSIS — F17211 Nicotine dependence, cigarettes, in remission: Secondary | ICD-10-CM | POA: Diagnosis not present

## 2016-02-02 DIAGNOSIS — Z4781 Encounter for orthopedic aftercare following surgical amputation: Secondary | ICD-10-CM | POA: Diagnosis not present

## 2016-02-02 NOTE — Discharge Summary (Signed)
[facility] Discharge Summary  Briana Mosley 1966/07/08 50 y.o. 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 female who presented to the [facility] office with chief complaint: pain in right foot and lower leg. Previous operation(s) include:  1. R CFA to BK pop BPG w/ NR ips GSV (Date: 03/06/15).  2. Revision of popliteal anastomosis for bleeding (Date: 03/16/15).  This patient failed to show for her last schedule appointment due to a family death. ~3 week ago she has sudden worsening in discoloration in her right foot. Since then she has had significant increase in pain in right foot with swelling. The patient's symptoms are: rest pain. The patient's treatment regimen currently included: maximal medical management.  Hospital Course:  The patient was admitted to the hospital on 01/26/2016 underwent right leg arteriogram to determine remaining revascularization options.   Findings: Right Left   RA Not visualized Not visualized  CIA Patent Patent  EIA Patent Patent  IIA Not well visualized Not well visualized  CFA Patent   SFA Occluded, proximal femoropopliteal bypass patent   PFA Patent, collaterals evident   Pop Not visualized, only geniculate collaterals evident   Trif Occluded   AT Not visualized proximal, reconstitutes miniscle target distal   Pero Not well visualized proximal, reconstitutes distally, <2 mm target   PT Not well visualized, superimposed with bone edge, miniscule target        POD 1 (s/p arteriogram): Dr. Bridgett Larsson had a long discussion with the patient in regards to the findings of the angiogram and possible options. Given the small targets, Dr. Bridgett Larsson doubted any  meaningful long term patency for a fem-tib bypass in right leg. Subsequently, he discussed proceeding with a R AKA with the patient. She was agreeable to proceed. She was placed on macrobid for a UTI.   She was taken to the operating room on 01/28/16 and underwent right above the knee amputation by Dr. Kellie Simmering. She tolerated the procedure well and was taken to the recovery room in stable condition.   POD 1 (s/p right AKA): The patient's dressing was clean and hemovac drain with 80 cc output. CIR was consulted for possible admission.   POD 2: The patient's incision was healing well and she had good range of motion. Her hemovac drain was discontinued. She was evaluated by PT and felt to be a good candidate for CIR. However, the patient wanted to go home. Home health services were set up and the patient was discharged home on POD 2 in good condition.          CBC    Component Value Date/Time   WBC 6.8 01/30/2016 0442   RBC 3.51* 01/30/2016 0442   HGB 10.3* 01/30/2016 0442   HCT 30.7* 01/30/2016 0442   PLT 151 01/30/2016 0442   MCV 87.5 01/30/2016 0442   MCH 29.3 01/30/2016 0442   MCHC 33.6 01/30/2016 0442   RDW 13.2 01/30/2016 0442   LYMPHSABS 1.5 03/16/2015 1400   MONOABS 0.7 03/16/2015 1400   EOSABS 0.1 03/16/2015 1400   BASOSABS 0.0 03/16/2015 1400    BMET    Component Value Date/Time   NA 143 01/30/2016 0442   K 3.5 01/30/2016 0442   CL 109 01/30/2016 0442   CO2 27 01/30/2016  0442   GLUCOSE 108* 01/30/2016 0442   BUN 7 01/30/2016 0442   CREATININE 0.72 01/30/2016 0442   CALCIUM 8.3* 01/30/2016 0442   GFRNONAA >60 01/30/2016 0442   GFRAA >60 01/30/2016 0442     Discharge Instructions:   The patient is discharged to home with extensive instructions on wound care and progressive ambulation.  They are instructed not to drive or perform any heavy lifting until returning to see the physician in his office.  Discharge Instructions    Call MD for:  redness, tenderness, or  signs of infection (pain, swelling, bleeding, redness, odor or green/yellow discharge around incision site)    Complete by:  As directed      Call MD for:  severe or increased pain, loss or decreased feeling  in affected limb(s)    Complete by:  As directed      Call MD for:  temperature >100.5    Complete by:  As directed      Discharge wound care:    Complete by:  As directed   Wash amputation site daily with soap and water and pat dry. Apply dry gauze to incision and wrap stump with kerlix and ACE daily.     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 Atherosclerosis of extremity with ulceration ([place]) 01/26/2016  . PAD (peripheral artery disease) ([place]) 03/16/2015  . Non-healing wound of lower extremity 03/06/2015  . Critical lower limb ischemia 02/21/2015  . Carotid artery occlusion 01/27/2015  . Carotid stenosis 01/27/2015  . Chronic venous insufficiency 01/27/2015  . Venous stasis ulcer of ankle ([place]) 01/27/2015  . Hallucinations 11/29/2011  . Confusion 11/29/2011  . Hypertension 11/29/2011  . Back pain 11/29/2011   Past Medical History  Diagnosis Date  . Chronic back pain   . Hypertension   . Thyroid disease     hyperthyroidism  . Lower extremity weakness   . Anxiety   . GERD (gastroesophageal reflux disease)     otc med if needed  . Atherosclerosis of artery of right lower extremity ([place]) 01/2016       Medication List    TAKE these medications         acetaminophen-codeine 300-30 MG tablet  Commonly known as:  TYLENOL #3  Take 1-2 tablets by mouth every 6 (six) hours as needed for moderate pain.     aspirin EC 81 MG tablet  Take 1 tablet (81 mg total) by mouth daily.     clonazePAM 0.5 MG tablet  Commonly known as:  KLONOPIN  Take 0.5 mg by mouth 3 (three) times daily as needed for anxiety.     magnesium oxide 400 MG tablet  Commonly known as:  MAG-OX  Take 400 mg by mouth daily.     nicotine 21 mg/24hr patch  Commonly known  as:  NICODERM CQ - dosed in mg/24 hours  Place 1 patch (21 mg total) onto the skin daily.     nitrofurantoin (macrocrystal-monohydrate) 100 MG capsule  Commonly known as:  MACROBID  Take 1 capsule (100 mg total) by mouth every 12 (twelve) hours.     Oxycodone HCl 10 MG Tabs  Take 1 tablet (10 mg total) by mouth 4 (four) times daily as needed (pain).     simvastatin 40 MG tablet  Commonly known as:  ZOCOR  Take 40 mg by mouth daily.        Tylenol # 3 #30 No Refill  Disposition: Home  Patient's condition: is Good  Follow up: 1. Dr. Bridgett Larsson in 4 weeks   Virgina Jock, PA-C [facility] [phone] 02/02/2016  4:33 PM    Addendum  I have independently interviewed and examined the patient, and I agree with the physician assistant's discharge summary.  The patient will return to the office in 4 weeks for removal of his staples in her R AKA.  Adele Barthel, MD [facility] [place] Office: [phone] Pager: [phone]  02/03/2016, 9:42 AM

## 2016-02-02 NOTE — Telephone Encounter (Signed)
sched appt 5/19 at 8:30. Lm on hm# to inform pt of appt.

## 2016-02-02 NOTE — Telephone Encounter (Signed)
-----   Message from Mena Goes, RN sent at 01/30/2016  3:42 PM EDT ----- Regarding: schedule   ----- Message -----    From: Alvia Grove, PA-C    Sent: 01/30/2016   2:43 PM      To: Vvs Charge Pool  S/p right AKA 01/28/16  F/u with Dr. Bridgett Larsson in 4 weeks  Thanks Maudie Mercury

## 2016-02-03 DIAGNOSIS — E0580 Other thyrotoxicosis without thyrotoxic crisis or storm: Secondary | ICD-10-CM | POA: Diagnosis not present

## 2016-02-03 DIAGNOSIS — Z4781 Encounter for orthopedic aftercare following surgical amputation: Secondary | ICD-10-CM | POA: Diagnosis not present

## 2016-02-03 DIAGNOSIS — F17211 Nicotine dependence, cigarettes, in remission: Secondary | ICD-10-CM | POA: Diagnosis not present

## 2016-02-03 DIAGNOSIS — I10 Essential (primary) hypertension: Secondary | ICD-10-CM | POA: Diagnosis not present

## 2016-02-03 DIAGNOSIS — Z7982 Long term (current) use of aspirin: Secondary | ICD-10-CM | POA: Diagnosis not present

## 2016-02-03 DIAGNOSIS — Z89611 Acquired absence of right leg above knee: Secondary | ICD-10-CM | POA: Diagnosis not present

## 2016-02-04 DIAGNOSIS — Z4781 Encounter for orthopedic aftercare following surgical amputation: Secondary | ICD-10-CM | POA: Diagnosis not present

## 2016-02-04 DIAGNOSIS — F17211 Nicotine dependence, cigarettes, in remission: Secondary | ICD-10-CM | POA: Diagnosis not present

## 2016-02-04 DIAGNOSIS — Z7982 Long term (current) use of aspirin: Secondary | ICD-10-CM | POA: Diagnosis not present

## 2016-02-04 DIAGNOSIS — Z89611 Acquired absence of right leg above knee: Secondary | ICD-10-CM | POA: Diagnosis not present

## 2016-02-04 DIAGNOSIS — I10 Essential (primary) hypertension: Secondary | ICD-10-CM | POA: Diagnosis not present

## 2016-02-04 DIAGNOSIS — E0580 Other thyrotoxicosis without thyrotoxic crisis or storm: Secondary | ICD-10-CM | POA: Diagnosis not present

## 2016-02-06 DIAGNOSIS — Z7982 Long term (current) use of aspirin: Secondary | ICD-10-CM | POA: Diagnosis not present

## 2016-02-06 DIAGNOSIS — I10 Essential (primary) hypertension: Secondary | ICD-10-CM | POA: Diagnosis not present

## 2016-02-06 DIAGNOSIS — F17211 Nicotine dependence, cigarettes, in remission: Secondary | ICD-10-CM | POA: Diagnosis not present

## 2016-02-06 DIAGNOSIS — E0580 Other thyrotoxicosis without thyrotoxic crisis or storm: Secondary | ICD-10-CM | POA: Diagnosis not present

## 2016-02-06 DIAGNOSIS — Z4781 Encounter for orthopedic aftercare following surgical amputation: Secondary | ICD-10-CM | POA: Diagnosis not present

## 2016-02-06 DIAGNOSIS — Z89611 Acquired absence of right leg above knee: Secondary | ICD-10-CM | POA: Diagnosis not present

## 2016-02-09 DIAGNOSIS — F17211 Nicotine dependence, cigarettes, in remission: Secondary | ICD-10-CM | POA: Diagnosis not present

## 2016-02-09 DIAGNOSIS — E0580 Other thyrotoxicosis without thyrotoxic crisis or storm: Secondary | ICD-10-CM | POA: Diagnosis not present

## 2016-02-09 DIAGNOSIS — I10 Essential (primary) hypertension: Secondary | ICD-10-CM | POA: Diagnosis not present

## 2016-02-09 DIAGNOSIS — Z89611 Acquired absence of right leg above knee: Secondary | ICD-10-CM | POA: Diagnosis not present

## 2016-02-09 DIAGNOSIS — Z7982 Long term (current) use of aspirin: Secondary | ICD-10-CM | POA: Diagnosis not present

## 2016-02-09 DIAGNOSIS — Z4781 Encounter for orthopedic aftercare following surgical amputation: Secondary | ICD-10-CM | POA: Diagnosis not present

## 2016-02-11 DIAGNOSIS — F17211 Nicotine dependence, cigarettes, in remission: Secondary | ICD-10-CM | POA: Diagnosis not present

## 2016-02-11 DIAGNOSIS — Z4781 Encounter for orthopedic aftercare following surgical amputation: Secondary | ICD-10-CM | POA: Diagnosis not present

## 2016-02-11 DIAGNOSIS — E0580 Other thyrotoxicosis without thyrotoxic crisis or storm: Secondary | ICD-10-CM | POA: Diagnosis not present

## 2016-02-11 DIAGNOSIS — Z89611 Acquired absence of right leg above knee: Secondary | ICD-10-CM | POA: Diagnosis not present

## 2016-02-11 DIAGNOSIS — I10 Essential (primary) hypertension: Secondary | ICD-10-CM | POA: Diagnosis not present

## 2016-02-11 DIAGNOSIS — Z7982 Long term (current) use of aspirin: Secondary | ICD-10-CM | POA: Diagnosis not present

## 2016-02-12 DIAGNOSIS — Z4781 Encounter for orthopedic aftercare following surgical amputation: Secondary | ICD-10-CM | POA: Diagnosis not present

## 2016-02-12 DIAGNOSIS — F17211 Nicotine dependence, cigarettes, in remission: Secondary | ICD-10-CM | POA: Diagnosis not present

## 2016-02-12 DIAGNOSIS — E0580 Other thyrotoxicosis without thyrotoxic crisis or storm: Secondary | ICD-10-CM | POA: Diagnosis not present

## 2016-02-12 DIAGNOSIS — I10 Essential (primary) hypertension: Secondary | ICD-10-CM | POA: Diagnosis not present

## 2016-02-12 DIAGNOSIS — Z7982 Long term (current) use of aspirin: Secondary | ICD-10-CM | POA: Diagnosis not present

## 2016-02-12 DIAGNOSIS — Z89611 Acquired absence of right leg above knee: Secondary | ICD-10-CM | POA: Diagnosis not present

## 2016-02-13 ENCOUNTER — Encounter: Payer: Self-pay | Admitting: Vascular Surgery

## 2016-02-13 DIAGNOSIS — E0580 Other thyrotoxicosis without thyrotoxic crisis or storm: Secondary | ICD-10-CM | POA: Diagnosis not present

## 2016-02-13 DIAGNOSIS — Z89611 Acquired absence of right leg above knee: Secondary | ICD-10-CM | POA: Diagnosis not present

## 2016-02-13 DIAGNOSIS — F17211 Nicotine dependence, cigarettes, in remission: Secondary | ICD-10-CM | POA: Diagnosis not present

## 2016-02-13 DIAGNOSIS — I10 Essential (primary) hypertension: Secondary | ICD-10-CM | POA: Diagnosis not present

## 2016-02-13 DIAGNOSIS — Z7982 Long term (current) use of aspirin: Secondary | ICD-10-CM | POA: Diagnosis not present

## 2016-02-13 DIAGNOSIS — Z4781 Encounter for orthopedic aftercare following surgical amputation: Secondary | ICD-10-CM | POA: Diagnosis not present

## 2016-02-16 DIAGNOSIS — Z7982 Long term (current) use of aspirin: Secondary | ICD-10-CM | POA: Diagnosis not present

## 2016-02-16 DIAGNOSIS — I10 Essential (primary) hypertension: Secondary | ICD-10-CM | POA: Diagnosis not present

## 2016-02-16 DIAGNOSIS — E0580 Other thyrotoxicosis without thyrotoxic crisis or storm: Secondary | ICD-10-CM | POA: Diagnosis not present

## 2016-02-16 DIAGNOSIS — F17211 Nicotine dependence, cigarettes, in remission: Secondary | ICD-10-CM | POA: Diagnosis not present

## 2016-02-16 DIAGNOSIS — Z89611 Acquired absence of right leg above knee: Secondary | ICD-10-CM | POA: Diagnosis not present

## 2016-02-16 DIAGNOSIS — Z4781 Encounter for orthopedic aftercare following surgical amputation: Secondary | ICD-10-CM | POA: Diagnosis not present

## 2016-02-19 DIAGNOSIS — F17211 Nicotine dependence, cigarettes, in remission: Secondary | ICD-10-CM | POA: Diagnosis not present

## 2016-02-19 DIAGNOSIS — Z7982 Long term (current) use of aspirin: Secondary | ICD-10-CM | POA: Diagnosis not present

## 2016-02-19 DIAGNOSIS — Z89611 Acquired absence of right leg above knee: Secondary | ICD-10-CM | POA: Diagnosis not present

## 2016-02-19 DIAGNOSIS — Z4781 Encounter for orthopedic aftercare following surgical amputation: Secondary | ICD-10-CM | POA: Diagnosis not present

## 2016-02-19 DIAGNOSIS — E0580 Other thyrotoxicosis without thyrotoxic crisis or storm: Secondary | ICD-10-CM | POA: Diagnosis not present

## 2016-02-19 DIAGNOSIS — I10 Essential (primary) hypertension: Secondary | ICD-10-CM | POA: Diagnosis not present

## 2016-02-20 ENCOUNTER — Encounter: Payer: Self-pay | Admitting: Vascular Surgery

## 2016-02-20 ENCOUNTER — Ambulatory Visit (INDEPENDENT_AMBULATORY_CARE_PROVIDER_SITE_OTHER): Payer: Medicare Other | Admitting: Vascular Surgery

## 2016-02-20 VITALS — BP 111/68 | HR 92 | Temp 98.5°F | Ht 65.0 in | Wt 140.0 lb

## 2016-02-20 DIAGNOSIS — Z89619 Acquired absence of unspecified leg above knee: Secondary | ICD-10-CM | POA: Insufficient documentation

## 2016-02-20 DIAGNOSIS — Z7982 Long term (current) use of aspirin: Secondary | ICD-10-CM | POA: Diagnosis not present

## 2016-02-20 DIAGNOSIS — F17211 Nicotine dependence, cigarettes, in remission: Secondary | ICD-10-CM | POA: Diagnosis not present

## 2016-02-20 DIAGNOSIS — Z89611 Acquired absence of right leg above knee: Secondary | ICD-10-CM

## 2016-02-20 DIAGNOSIS — Z4781 Encounter for orthopedic aftercare following surgical amputation: Secondary | ICD-10-CM | POA: Diagnosis not present

## 2016-02-20 DIAGNOSIS — I10 Essential (primary) hypertension: Secondary | ICD-10-CM | POA: Diagnosis not present

## 2016-02-20 DIAGNOSIS — E0580 Other thyrotoxicosis without thyrotoxic crisis or storm: Secondary | ICD-10-CM | POA: Diagnosis not present

## 2016-02-20 NOTE — Progress Notes (Signed)
    Postoperative Visit   History of Present Illness  Briana Mosley is a 50 y.o. female who presents for postoperative follow-up for: right above-the-knee amputation with Dr. Kellie Simmering (Date: 01/28/16).  The patient's wounds are healed.  The patient notes pain is well controlled.  The patient's current symptoms are: none.  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  For VQI Use Only  PRE-ADM LIVING: Home  AMB STATUS: Ambulatory  Physical Examination  Filed Vitals:   02/20/16 0821  BP: 111/68  Pulse: 92  Temp: 98.5 F (36.9 C)   RLE: Incision is healing.  Staples are intact.  Medical Decision Making  Briana Mosley is a 50 y.o. female who presents s/p right above-the-knee amputation, LLE PAD   The patient's stump is healing appropriately with resolution of pre-operative symptoms.    I recommended half of the staples today, as she appears to be having a staple reaction.  Will remove the remainder next week (4 weeks). I discussed in depth with the patient the nature of atherosclerosis, and emphasized the importance of maximal medical management including strict control of blood pressure, blood glucose, and lipid levels, obtaining regular exercise, and cessation of smoking.  The patient is aware that without maximal medical management the underlying atherosclerotic disease process will progress, possibly leading to a more proximal amputation. The patient agrees to participate in their maximal medical care.  Thank you for allowing [place] to participate in this patient's care.  The patient has been referred for prosthetic fitting.  She will follow up 3 months for LLE ABI to follow the PAD in that leg.  The patient can follow up with [place] as needed.  Adele Barthel, MD [facility] [place] Office: [phone] Pager: [phone]  02/20/2016, 8:35 AM

## 2016-02-23 DIAGNOSIS — I10 Essential (primary) hypertension: Secondary | ICD-10-CM | POA: Diagnosis not present

## 2016-02-23 DIAGNOSIS — Z89611 Acquired absence of right leg above knee: Secondary | ICD-10-CM | POA: Diagnosis not present

## 2016-02-23 DIAGNOSIS — Z4781 Encounter for orthopedic aftercare following surgical amputation: Secondary | ICD-10-CM | POA: Diagnosis not present

## 2016-02-23 DIAGNOSIS — Z7982 Long term (current) use of aspirin: Secondary | ICD-10-CM | POA: Diagnosis not present

## 2016-02-23 DIAGNOSIS — F17211 Nicotine dependence, cigarettes, in remission: Secondary | ICD-10-CM | POA: Diagnosis not present

## 2016-02-23 DIAGNOSIS — E0580 Other thyrotoxicosis without thyrotoxic crisis or storm: Secondary | ICD-10-CM | POA: Diagnosis not present

## 2016-02-25 ENCOUNTER — Encounter: Payer: Self-pay | Admitting: Vascular Surgery

## 2016-02-25 DIAGNOSIS — Z4781 Encounter for orthopedic aftercare following surgical amputation: Secondary | ICD-10-CM | POA: Diagnosis not present

## 2016-02-25 DIAGNOSIS — I10 Essential (primary) hypertension: Secondary | ICD-10-CM | POA: Diagnosis not present

## 2016-02-25 DIAGNOSIS — E0580 Other thyrotoxicosis without thyrotoxic crisis or storm: Secondary | ICD-10-CM | POA: Diagnosis not present

## 2016-02-25 DIAGNOSIS — F17211 Nicotine dependence, cigarettes, in remission: Secondary | ICD-10-CM | POA: Diagnosis not present

## 2016-02-25 DIAGNOSIS — Z89611 Acquired absence of right leg above knee: Secondary | ICD-10-CM | POA: Diagnosis not present

## 2016-02-25 DIAGNOSIS — Z7982 Long term (current) use of aspirin: Secondary | ICD-10-CM | POA: Diagnosis not present

## 2016-02-26 DIAGNOSIS — Z7982 Long term (current) use of aspirin: Secondary | ICD-10-CM | POA: Diagnosis not present

## 2016-02-26 DIAGNOSIS — E0580 Other thyrotoxicosis without thyrotoxic crisis or storm: Secondary | ICD-10-CM | POA: Diagnosis not present

## 2016-02-26 DIAGNOSIS — Z89611 Acquired absence of right leg above knee: Secondary | ICD-10-CM | POA: Diagnosis not present

## 2016-02-26 DIAGNOSIS — Z4781 Encounter for orthopedic aftercare following surgical amputation: Secondary | ICD-10-CM | POA: Diagnosis not present

## 2016-02-26 DIAGNOSIS — I10 Essential (primary) hypertension: Secondary | ICD-10-CM | POA: Diagnosis not present

## 2016-02-26 DIAGNOSIS — F17211 Nicotine dependence, cigarettes, in remission: Secondary | ICD-10-CM | POA: Diagnosis not present

## 2016-02-27 ENCOUNTER — Encounter: Payer: Medicare Other | Admitting: Family

## 2016-02-27 ENCOUNTER — Encounter: Payer: Self-pay | Admitting: Family

## 2016-02-27 DIAGNOSIS — Z89611 Acquired absence of right leg above knee: Secondary | ICD-10-CM | POA: Diagnosis not present

## 2016-02-27 DIAGNOSIS — F17211 Nicotine dependence, cigarettes, in remission: Secondary | ICD-10-CM | POA: Diagnosis not present

## 2016-02-27 DIAGNOSIS — I10 Essential (primary) hypertension: Secondary | ICD-10-CM | POA: Diagnosis not present

## 2016-02-27 DIAGNOSIS — Z7982 Long term (current) use of aspirin: Secondary | ICD-10-CM | POA: Diagnosis not present

## 2016-02-27 DIAGNOSIS — E0580 Other thyrotoxicosis without thyrotoxic crisis or storm: Secondary | ICD-10-CM | POA: Diagnosis not present

## 2016-02-27 DIAGNOSIS — Z4781 Encounter for orthopedic aftercare following surgical amputation: Secondary | ICD-10-CM | POA: Diagnosis not present

## 2016-02-27 NOTE — Addendum Note (Signed)
Addended by: Thresa Ross C on: 02/27/2016 12:40 PM   Modules accepted: Orders

## 2016-03-02 ENCOUNTER — Ambulatory Visit (INDEPENDENT_AMBULATORY_CARE_PROVIDER_SITE_OTHER): Payer: Medicare Other | Admitting: Family

## 2016-03-02 ENCOUNTER — Encounter: Payer: Self-pay | Admitting: Family

## 2016-03-02 VITALS — BP 104/62 | HR 80 | Temp 97.9°F | Resp 18 | Ht 65.0 in | Wt 140.0 lb

## 2016-03-02 DIAGNOSIS — Z89611 Acquired absence of right leg above knee: Secondary | ICD-10-CM

## 2016-03-02 DIAGNOSIS — I779 Disorder of arteries and arterioles, unspecified: Secondary | ICD-10-CM

## 2016-03-02 DIAGNOSIS — F17200 Nicotine dependence, unspecified, uncomplicated: Secondary | ICD-10-CM

## 2016-03-02 DIAGNOSIS — Z720 Tobacco use: Secondary | ICD-10-CM

## 2016-03-02 NOTE — Patient Instructions (Signed)
Peripheral Vascular Disease Peripheral vascular disease (PVD) is a disease of the blood vessels that are not part of your heart and brain. A simple term for PVD is poor circulation. In most cases, PVD narrows the blood vessels that carry blood from your heart to the rest of your body. This can result in a decreased supply of blood to your arms, legs, and internal organs, like your stomach or kidneys. However, it most often affects a person's lower legs and feet. There are two types of PVD.  Organic PVD. This is the more common type. It is caused by damage to the structure of blood vessels.  Functional PVD. This is caused by conditions that make blood vessels contract and tighten (spasm). Without treatment, PVD tends to get worse over time. PVD can also lead to acute ischemic limb. This is when an arm or limb suddenly has trouble getting enough blood. This is a medical emergency. CAUSES Each type of PVD has many different causes. The most common cause of PVD is buildup of a fatty material (plaque) inside of your arteries (atherosclerosis). Small amounts of plaque can break off from the walls of the blood vessels and become lodged in a smaller artery. This blocks blood flow and can cause acute ischemic limb. Other common causes of PVD include:  Blood clots that form inside of blood vessels.  Injuries to blood vessels.  Diseases that cause inflammation of blood vessels or cause blood vessel spasms.  Health behaviors and health history that increase your risk of developing PVD. RISK FACTORS  You may have a greater risk of PVD if you:  Have a family history of PVD.  Have certain medical conditions, including:  High cholesterol.  Diabetes.  High blood pressure (hypertension).  Coronary heart disease.  Past problems with blood clots.  Past injury, such as burns or a broken bone. These may have damaged blood vessels in your limbs.  Buerger disease. This is caused by inflamed blood  vessels in your hands and feet.  Some forms of arthritis.  Rare birth defects that affect the arteries in your legs.  Use tobacco.  Do not get enough exercise.  Are obese.  Are age 50 or older. SIGNS AND SYMPTOMS  PVD may cause many different symptoms. Your symptoms depend on what part of your body is not getting enough blood. Some common signs and symptoms include:  Cramps in your lower legs. This may be a symptom of poor leg circulation (claudication).  Pain and weakness in your legs while you are physically active that goes away when you rest (intermittent claudication).  Leg pain when at rest.  Leg numbness, tingling, or weakness.  Coldness in a leg or foot, especially when compared with the other leg.  Skin or hair changes. These can include:  Hair loss.  Shiny skin.  Pale or bluish skin.  Thick toenails.  Inability to get or maintain an erection (erectile dysfunction). People with PVD are more prone to developing ulcers and sores on their toes, feet, or legs. These may take longer than normal to heal. DIAGNOSIS Your health care provider may diagnose PVD from your signs and symptoms. The health care provider will also do a physical exam. You may have tests to find out what is causing your PVD and determine its severity. Tests may include:  Blood pressure recordings from your arms and legs and measurements of the strength of your pulses (pulse volume recordings).  Imaging studies using sound waves to take pictures of   the blood flow through your blood vessels (Doppler ultrasound).  Injecting a dye into your blood vessels before having imaging studies using:  X-rays (angiogram or arteriogram).  Computer-generated X-rays (CT angiogram).  A powerful electromagnetic field and a computer (magnetic resonance angiogram or MRA). TREATMENT Treatment for PVD depends on the cause of your condition and the severity of your symptoms. It also depends on your age. Underlying  causes need to be treated and controlled. These include long-lasting (chronic) conditions, such as diabetes, high cholesterol, and high blood pressure. You may need to first try making lifestyle changes and taking medicines. Surgery may be needed if these do not work. Lifestyle changes may include:  Quitting smoking.  Exercising regularly.  Following a low-fat, low-cholesterol diet. Medicines may include:  Blood thinners to prevent blood clots.  Medicines to improve blood flow.  Medicines to improve your blood cholesterol levels. Surgical procedures may include:  A procedure that uses an inflated balloon to open a blocked artery and improve blood flow (angioplasty).  A procedure to put in a tube (stent) to keep a blocked artery open (stent implant).  Surgery to reroute blood flow around a blocked artery (peripheral bypass surgery).  Surgery to remove dead tissue from an infected wound on the affected limb.  Amputation. This is surgical removal of the affected limb. This may be necessary in cases of acute ischemic limb that are not improved through medical or surgical treatments. HOME CARE INSTRUCTIONS  Take medicines only as directed by your health care provider.  Do not use any tobacco products, including cigarettes, chewing tobacco, or electronic cigarettes. If you need help quitting, ask your health care provider.  Lose weight if you are overweight, and maintain a healthy weight as directed by your health care provider.  Eat a diet that is low in fat and cholesterol. If you need help, ask your health care provider.  Exercise regularly. Ask your health care provider to suggest some good activities for you.  Use compression stockings or other mechanical devices as directed by your health care provider.  Take good care of your feet.  Wear comfortable shoes that fit well.  Check your feet often for any cuts or sores. SEEK MEDICAL CARE IF:  You have cramps in your legs  while walking.  You have leg pain when you are at rest.  You have coldness in a leg or foot.  Your skin changes.  You have erectile dysfunction.  You have cuts or sores on your feet that are not healing. SEEK IMMEDIATE MEDICAL CARE IF:  Your arm or leg turns cold and blue.  Your arms or legs become red, warm, swollen, painful, or numb.  You have chest pain or trouble breathing.  You suddenly have weakness in your face, arm, or leg.  You become very confused or lose the ability to speak.  You suddenly have a very bad headache or lose your vision.   This information is not intended to replace advice given to you by your health care provider. Make sure you discuss any questions you have with your health care provider.   Document Released: 10/28/2004 Document Revised: 10/11/2014 Document Reviewed: 02/28/2014 Elsevier Interactive Patient Education 2016 Elsevier Inc[employer]     Steps to Quit Smoking  Smoking tobacco can be harmful to your health and can affect almost every organ in your body. Smoking puts you, and those around you, at risk for developing many serious chronic diseases. Quitting smoking is difficult, but it is one of   the best things that you can do for your health. It is never too late to quit. WHAT ARE THE BENEFITS OF QUITTING SMOKING? When you quit smoking, you lower your risk of developing serious diseases and conditions, such as:  Lung cancer or lung disease, such as COPD.  Heart disease.  Stroke.  Heart attack.  Infertility.  Osteoporosis and bone fractures. Additionally, symptoms such as coughing, wheezing, and shortness of breath may get better when you quit. You may also find that you get sick less often because your body is stronger at fighting off colds and infections. If you are pregnant, quitting smoking can help to reduce your chances of having a baby of low birth weight. HOW DO I GET READY TO QUIT? When you decide to quit smoking, create a plan to  make sure that you are successful. Before you quit:  Pick a date to quit. Set a date within the next two weeks to give you time to prepare.  Write down the reasons why you are quitting. Keep this list in places where you will see it often, such as on your bathroom mirror or in your car or wallet.  Identify the people, places, things, and activities that make you want to smoke (triggers) and avoid them. Make sure to take these actions:  Throw away all cigarettes at home, at work, and in your car.  Throw away smoking accessories, such as ashtrays and lighters.  Clean your car and make sure to empty the ashtray.  Clean your home, including curtains and carpets.  Tell your family, friends, and coworkers that you are quitting. Support from your loved ones can make quitting easier.  Talk with your health care provider about your options for quitting smoking.  Find out what treatment options are covered by your health insurance. WHAT STRATEGIES CAN I USE TO QUIT SMOKING?  Talk with your healthcare provider about different strategies to quit smoking. Some strategies include:  Quitting smoking altogether instead of gradually lessening how much you smoke over a period of time. Research shows that quitting "cold turkey" is more successful than gradually quitting.  Attending in-person counseling to help you build problem-solving skills. You are more likely to have success in quitting if you attend several counseling sessions. Even short sessions of 10 minutes can be effective.  Finding resources and support systems that can help you to quit smoking and remain smoke-free after you quit. These resources are most helpful when you use them often. They can include:  Online chats with a counselor.  Telephone quitlines.  Printed self-help materials.  Support groups or group counseling.  Text messaging programs.  Mobile phone applications.  Taking medicines to help you quit smoking. (If you are  pregnant or breastfeeding, talk with your health care provider first.) Some medicines contain nicotine and some do not. Both types of medicines help with cravings, but the medicines that include nicotine help to relieve withdrawal symptoms. Your health care provider may recommend:  Nicotine patches, gum, or lozenges.  Nicotine inhalers or sprays.  Non-nicotine medicine that is taken by mouth. Talk with your health care provider about combining strategies, such as taking medicines while you are also receiving in-person counseling. Using these two strategies together makes you more likely to succeed in quitting than if you used either strategy on its own. If you are pregnant or breastfeeding, talk with your health care provider about finding counseling or other support strategies to quit smoking. Do not take medicine to help you   quit smoking unless told to do so by your health care provider. WHAT THINGS CAN I DO TO MAKE IT EASIER TO QUIT? Quitting smoking might feel overwhelming at first, but there is a lot that you can do to make it easier. Take these important actions:  Reach out to your family and friends and ask that they support and encourage you during this time. Call telephone quitlines, reach out to support groups, or work with a counselor for support.  Ask people who smoke to avoid smoking around you.  Avoid places that trigger you to smoke, such as bars, parties, or smoke-break areas at work.  Spend time around people who do not smoke.  Lessen stress in your life, because stress can be a smoking trigger for some people. To lessen stress, try:  Exercising regularly.  Deep-breathing exercises.  Yoga.  Meditating.  Performing a body scan. This involves closing your eyes, scanning your body from head to toe, and noticing which parts of your body are particularly tense. Purposefully relax the muscles in those areas.  Download or purchase mobile phone or tablet apps (applications)  that can help you stick to your quit plan by providing reminders, tips, and encouragement. There are many free apps, such as QuitGuide from the CDC (Centers for Disease Control and Prevention). You can find other support for quitting smoking (smoking cessation) through smokefree.gov and other websites. HOW WILL I FEEL WHEN I QUIT SMOKING? Within the first 24 hours of quitting smoking, you may start to feel some withdrawal symptoms. These symptoms are usually most noticeable 2-3 days after quitting, but they usually do not last beyond 2-3 weeks. Changes or symptoms that you might experience include:  Mood swings.  Restlessness, anxiety, or irritation.  Difficulty concentrating.  Dizziness.  Strong cravings for sugary foods in addition to nicotine.  Mild weight gain.  Constipation.  Nausea.  Coughing or a sore throat.  Changes in how your medicines work in your body.  A depressed mood.  Difficulty sleeping (insomnia). After the first 2-3 weeks of quitting, you may start to notice more positive results, such as:  Improved sense of smell and taste.  Decreased coughing and sore throat.  Slower heart rate.  Lower blood pressure.  Clearer skin.  The ability to breathe more easily.  Fewer sick days. Quitting smoking is very challenging for most people. Do not get discouraged if you are not successful the first time. Some people need to make many attempts to quit before they achieve long-term success. Do your best to stick to your quit plan, and talk with your health care provider if you have any questions or concerns.   This information is not intended to replace advice given to you by your health care provider. Make sure you discuss any questions you have with your health care provider.   Document Released: 09/14/2001 Document Revised: 02/04/2015 Document Reviewed: 02/04/2015 Elsevier Interactive Patient Education 2016 Elsevier Inc.  

## 2016-03-02 NOTE — Progress Notes (Signed)
    Postoperative Visit   History of Present Illness  Briana Mosley is a 50 y.o. female patient of Dr. Bridgett Larsson and Kellie Simmering who is s/p right above-the-knee amputation by Dr. Kellie Simmering (Date: 01/28/16). The patient's wounds are healed. The patient has a pain management contract with Dr. Woody Seller.  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  She saw Dr. Bridgett Larsson on 02/20/16 and appeared to have a staple reaction. Pt returns today to have the remainder of the staples removed since this is 4 weeks post amputation.  The patient's right AKA stump incision is healed.  The patient notes pain is well controlled.  The patient's current symptoms are: none. She states she is weaning down her smoking, down to about 1/4 to 1/3 ppd. She states she does not have DM.  For VQI Use Only  PRE-ADM LIVING: Home  AMB STATUS: Wheelchair, but states she walks with a walker at home.  Physical Examination  Filed Vitals:   03/02/16 1421  BP: 104/62  Pulse: 80  Temp: 97.9 F (36.6 C)  TempSrc: Oral  Resp: 18  Height: 5\' 5"  (1.651 m)  Weight: 140 lb (63.504 kg)  SpO2: 99%   Body mass index is 23.3 kg/(m^2).  Right LE: Incision is well healed.  Staples are intact. No signs of infection: no erythema, no swelling, no drainage.  Medical Decision Making  Briana Mosley is a 50 y.o. female who presents s/p right above-the-knee amputation on 01/28/16. Stump incision is well healed. Remaining staples removed, right AKA stump cleansed and protective dressing applied. Pt already has a prescription for [facility] for prosthesis fitting, PT, and OT.  The patient's stump is healing appropriately with resolution of pre-operative symptoms.  Follow up in 3 months with [facility] to follow PAD.  Thank you for allowing [place] to participate in this patient's care.   NICKEL, Sharmon Leyden, RN, MSN, FNP-C [facility] Office: [phone]  03/02/2016, 2:53 PM  Clinic MD:  Kellie Simmering

## 2016-03-03 DIAGNOSIS — E0580 Other thyrotoxicosis without thyrotoxic crisis or storm: Secondary | ICD-10-CM | POA: Diagnosis not present

## 2016-03-03 DIAGNOSIS — Z89611 Acquired absence of right leg above knee: Secondary | ICD-10-CM | POA: Diagnosis not present

## 2016-03-03 DIAGNOSIS — Z7982 Long term (current) use of aspirin: Secondary | ICD-10-CM | POA: Diagnosis not present

## 2016-03-03 DIAGNOSIS — F17211 Nicotine dependence, cigarettes, in remission: Secondary | ICD-10-CM | POA: Diagnosis not present

## 2016-03-03 DIAGNOSIS — Z4781 Encounter for orthopedic aftercare following surgical amputation: Secondary | ICD-10-CM | POA: Diagnosis not present

## 2016-03-03 DIAGNOSIS — I10 Essential (primary) hypertension: Secondary | ICD-10-CM | POA: Diagnosis not present

## 2016-03-05 DIAGNOSIS — Z89611 Acquired absence of right leg above knee: Secondary | ICD-10-CM | POA: Diagnosis not present

## 2016-03-05 DIAGNOSIS — F17211 Nicotine dependence, cigarettes, in remission: Secondary | ICD-10-CM | POA: Diagnosis not present

## 2016-03-05 DIAGNOSIS — E0580 Other thyrotoxicosis without thyrotoxic crisis or storm: Secondary | ICD-10-CM | POA: Diagnosis not present

## 2016-03-05 DIAGNOSIS — Z4781 Encounter for orthopedic aftercare following surgical amputation: Secondary | ICD-10-CM | POA: Diagnosis not present

## 2016-03-05 DIAGNOSIS — I10 Essential (primary) hypertension: Secondary | ICD-10-CM | POA: Diagnosis not present

## 2016-03-05 DIAGNOSIS — Z7982 Long term (current) use of aspirin: Secondary | ICD-10-CM | POA: Diagnosis not present

## 2016-03-10 DIAGNOSIS — F17211 Nicotine dependence, cigarettes, in remission: Secondary | ICD-10-CM | POA: Diagnosis not present

## 2016-03-10 DIAGNOSIS — Z4781 Encounter for orthopedic aftercare following surgical amputation: Secondary | ICD-10-CM | POA: Diagnosis not present

## 2016-03-10 DIAGNOSIS — Z89611 Acquired absence of right leg above knee: Secondary | ICD-10-CM | POA: Diagnosis not present

## 2016-03-10 DIAGNOSIS — I10 Essential (primary) hypertension: Secondary | ICD-10-CM | POA: Diagnosis not present

## 2016-03-10 DIAGNOSIS — Z7982 Long term (current) use of aspirin: Secondary | ICD-10-CM | POA: Diagnosis not present

## 2016-03-10 DIAGNOSIS — E0580 Other thyrotoxicosis without thyrotoxic crisis or storm: Secondary | ICD-10-CM | POA: Diagnosis not present

## 2016-03-11 DIAGNOSIS — Z4781 Encounter for orthopedic aftercare following surgical amputation: Secondary | ICD-10-CM | POA: Diagnosis not present

## 2016-03-11 DIAGNOSIS — E0580 Other thyrotoxicosis without thyrotoxic crisis or storm: Secondary | ICD-10-CM | POA: Diagnosis not present

## 2016-03-11 DIAGNOSIS — Z89611 Acquired absence of right leg above knee: Secondary | ICD-10-CM | POA: Diagnosis not present

## 2016-03-11 DIAGNOSIS — I10 Essential (primary) hypertension: Secondary | ICD-10-CM | POA: Diagnosis not present

## 2016-03-11 DIAGNOSIS — Z7982 Long term (current) use of aspirin: Secondary | ICD-10-CM | POA: Diagnosis not present

## 2016-03-11 DIAGNOSIS — F17211 Nicotine dependence, cigarettes, in remission: Secondary | ICD-10-CM | POA: Diagnosis not present

## 2016-03-12 DIAGNOSIS — Z4781 Encounter for orthopedic aftercare following surgical amputation: Secondary | ICD-10-CM | POA: Diagnosis not present

## 2016-03-12 DIAGNOSIS — Z7982 Long term (current) use of aspirin: Secondary | ICD-10-CM | POA: Diagnosis not present

## 2016-03-12 DIAGNOSIS — E0580 Other thyrotoxicosis without thyrotoxic crisis or storm: Secondary | ICD-10-CM | POA: Diagnosis not present

## 2016-03-12 DIAGNOSIS — I10 Essential (primary) hypertension: Secondary | ICD-10-CM | POA: Diagnosis not present

## 2016-03-12 DIAGNOSIS — Z89611 Acquired absence of right leg above knee: Secondary | ICD-10-CM | POA: Diagnosis not present

## 2016-03-12 DIAGNOSIS — F17211 Nicotine dependence, cigarettes, in remission: Secondary | ICD-10-CM | POA: Diagnosis not present

## 2016-03-15 DIAGNOSIS — Z89611 Acquired absence of right leg above knee: Secondary | ICD-10-CM | POA: Diagnosis not present

## 2016-03-15 DIAGNOSIS — F17211 Nicotine dependence, cigarettes, in remission: Secondary | ICD-10-CM | POA: Diagnosis not present

## 2016-03-15 DIAGNOSIS — Z4781 Encounter for orthopedic aftercare following surgical amputation: Secondary | ICD-10-CM | POA: Diagnosis not present

## 2016-03-15 DIAGNOSIS — E0580 Other thyrotoxicosis without thyrotoxic crisis or storm: Secondary | ICD-10-CM | POA: Diagnosis not present

## 2016-03-15 DIAGNOSIS — Z7982 Long term (current) use of aspirin: Secondary | ICD-10-CM | POA: Diagnosis not present

## 2016-03-15 DIAGNOSIS — I10 Essential (primary) hypertension: Secondary | ICD-10-CM | POA: Diagnosis not present

## 2016-03-16 DIAGNOSIS — Z4781 Encounter for orthopedic aftercare following surgical amputation: Secondary | ICD-10-CM | POA: Diagnosis not present

## 2016-03-16 DIAGNOSIS — Z89611 Acquired absence of right leg above knee: Secondary | ICD-10-CM | POA: Diagnosis not present

## 2016-03-16 DIAGNOSIS — Z7982 Long term (current) use of aspirin: Secondary | ICD-10-CM | POA: Diagnosis not present

## 2016-03-16 DIAGNOSIS — E0580 Other thyrotoxicosis without thyrotoxic crisis or storm: Secondary | ICD-10-CM | POA: Diagnosis not present

## 2016-03-16 DIAGNOSIS — I10 Essential (primary) hypertension: Secondary | ICD-10-CM | POA: Diagnosis not present

## 2016-03-16 DIAGNOSIS — F17211 Nicotine dependence, cigarettes, in remission: Secondary | ICD-10-CM | POA: Diagnosis not present

## 2016-03-17 DIAGNOSIS — E0580 Other thyrotoxicosis without thyrotoxic crisis or storm: Secondary | ICD-10-CM | POA: Diagnosis not present

## 2016-03-17 DIAGNOSIS — F17211 Nicotine dependence, cigarettes, in remission: Secondary | ICD-10-CM | POA: Diagnosis not present

## 2016-03-17 DIAGNOSIS — Z4781 Encounter for orthopedic aftercare following surgical amputation: Secondary | ICD-10-CM | POA: Diagnosis not present

## 2016-03-17 DIAGNOSIS — Z89611 Acquired absence of right leg above knee: Secondary | ICD-10-CM | POA: Diagnosis not present

## 2016-03-17 DIAGNOSIS — Z7982 Long term (current) use of aspirin: Secondary | ICD-10-CM | POA: Diagnosis not present

## 2016-03-17 DIAGNOSIS — I10 Essential (primary) hypertension: Secondary | ICD-10-CM | POA: Diagnosis not present

## 2016-03-19 DIAGNOSIS — Z7982 Long term (current) use of aspirin: Secondary | ICD-10-CM | POA: Diagnosis not present

## 2016-03-19 DIAGNOSIS — E0580 Other thyrotoxicosis without thyrotoxic crisis or storm: Secondary | ICD-10-CM | POA: Diagnosis not present

## 2016-03-19 DIAGNOSIS — I10 Essential (primary) hypertension: Secondary | ICD-10-CM | POA: Diagnosis not present

## 2016-03-19 DIAGNOSIS — F17211 Nicotine dependence, cigarettes, in remission: Secondary | ICD-10-CM | POA: Diagnosis not present

## 2016-03-19 DIAGNOSIS — Z89611 Acquired absence of right leg above knee: Secondary | ICD-10-CM | POA: Diagnosis not present

## 2016-03-19 DIAGNOSIS — Z4781 Encounter for orthopedic aftercare following surgical amputation: Secondary | ICD-10-CM | POA: Diagnosis not present

## 2016-03-22 DIAGNOSIS — Z7982 Long term (current) use of aspirin: Secondary | ICD-10-CM | POA: Diagnosis not present

## 2016-03-22 DIAGNOSIS — E0580 Other thyrotoxicosis without thyrotoxic crisis or storm: Secondary | ICD-10-CM | POA: Diagnosis not present

## 2016-03-22 DIAGNOSIS — F17211 Nicotine dependence, cigarettes, in remission: Secondary | ICD-10-CM | POA: Diagnosis not present

## 2016-03-22 DIAGNOSIS — I10 Essential (primary) hypertension: Secondary | ICD-10-CM | POA: Diagnosis not present

## 2016-03-22 DIAGNOSIS — Z4781 Encounter for orthopedic aftercare following surgical amputation: Secondary | ICD-10-CM | POA: Diagnosis not present

## 2016-03-22 DIAGNOSIS — Z89611 Acquired absence of right leg above knee: Secondary | ICD-10-CM | POA: Diagnosis not present

## 2016-03-23 DIAGNOSIS — Z4781 Encounter for orthopedic aftercare following surgical amputation: Secondary | ICD-10-CM | POA: Diagnosis not present

## 2016-03-23 DIAGNOSIS — Z89611 Acquired absence of right leg above knee: Secondary | ICD-10-CM | POA: Diagnosis not present

## 2016-03-23 DIAGNOSIS — F17211 Nicotine dependence, cigarettes, in remission: Secondary | ICD-10-CM | POA: Diagnosis not present

## 2016-03-23 DIAGNOSIS — I10 Essential (primary) hypertension: Secondary | ICD-10-CM | POA: Diagnosis not present

## 2016-03-23 DIAGNOSIS — Z7982 Long term (current) use of aspirin: Secondary | ICD-10-CM | POA: Diagnosis not present

## 2016-03-23 DIAGNOSIS — E0580 Other thyrotoxicosis without thyrotoxic crisis or storm: Secondary | ICD-10-CM | POA: Diagnosis not present

## 2016-03-25 DIAGNOSIS — Z7982 Long term (current) use of aspirin: Secondary | ICD-10-CM | POA: Diagnosis not present

## 2016-03-25 DIAGNOSIS — Z4781 Encounter for orthopedic aftercare following surgical amputation: Secondary | ICD-10-CM | POA: Diagnosis not present

## 2016-03-25 DIAGNOSIS — I10 Essential (primary) hypertension: Secondary | ICD-10-CM | POA: Diagnosis not present

## 2016-03-25 DIAGNOSIS — E0580 Other thyrotoxicosis without thyrotoxic crisis or storm: Secondary | ICD-10-CM | POA: Diagnosis not present

## 2016-03-25 DIAGNOSIS — Z89611 Acquired absence of right leg above knee: Secondary | ICD-10-CM | POA: Diagnosis not present

## 2016-03-25 DIAGNOSIS — F17211 Nicotine dependence, cigarettes, in remission: Secondary | ICD-10-CM | POA: Diagnosis not present

## 2016-03-30 DIAGNOSIS — I739 Peripheral vascular disease, unspecified: Secondary | ICD-10-CM | POA: Diagnosis not present

## 2016-03-30 DIAGNOSIS — M545 Low back pain: Secondary | ICD-10-CM | POA: Diagnosis not present

## 2016-03-30 DIAGNOSIS — I10 Essential (primary) hypertension: Secondary | ICD-10-CM | POA: Diagnosis not present

## 2016-03-30 DIAGNOSIS — F419 Anxiety disorder, unspecified: Secondary | ICD-10-CM | POA: Diagnosis not present

## 2016-03-30 DIAGNOSIS — Z299 Encounter for prophylactic measures, unspecified: Secondary | ICD-10-CM | POA: Diagnosis not present

## 2016-03-31 DIAGNOSIS — Z7982 Long term (current) use of aspirin: Secondary | ICD-10-CM | POA: Diagnosis not present

## 2016-03-31 DIAGNOSIS — F17211 Nicotine dependence, cigarettes, in remission: Secondary | ICD-10-CM | POA: Diagnosis not present

## 2016-03-31 DIAGNOSIS — Z89611 Acquired absence of right leg above knee: Secondary | ICD-10-CM | POA: Diagnosis not present

## 2016-03-31 DIAGNOSIS — I10 Essential (primary) hypertension: Secondary | ICD-10-CM | POA: Diagnosis not present

## 2016-03-31 DIAGNOSIS — E0580 Other thyrotoxicosis without thyrotoxic crisis or storm: Secondary | ICD-10-CM | POA: Diagnosis not present

## 2016-03-31 DIAGNOSIS — Z4781 Encounter for orthopedic aftercare following surgical amputation: Secondary | ICD-10-CM | POA: Diagnosis not present

## 2016-04-01 DIAGNOSIS — Z4781 Encounter for orthopedic aftercare following surgical amputation: Secondary | ICD-10-CM | POA: Diagnosis not present

## 2016-04-01 DIAGNOSIS — Z89611 Acquired absence of right leg above knee: Secondary | ICD-10-CM | POA: Diagnosis not present

## 2016-04-01 DIAGNOSIS — F17211 Nicotine dependence, cigarettes, in remission: Secondary | ICD-10-CM | POA: Diagnosis not present

## 2016-04-01 DIAGNOSIS — I10 Essential (primary) hypertension: Secondary | ICD-10-CM | POA: Diagnosis not present

## 2016-04-01 DIAGNOSIS — Z7982 Long term (current) use of aspirin: Secondary | ICD-10-CM | POA: Diagnosis not present

## 2016-04-01 DIAGNOSIS — E0580 Other thyrotoxicosis without thyrotoxic crisis or storm: Secondary | ICD-10-CM | POA: Diagnosis not present

## 2016-04-30 NOTE — Addendum Note (Signed)
Addended by: Reola Calkins on: 04/30/2016 02:12 PM   Modules accepted: Orders

## 2016-05-06 ENCOUNTER — Other Ambulatory Visit: Payer: Self-pay | Admitting: *Deleted

## 2016-05-06 DIAGNOSIS — Z89619 Acquired absence of unspecified leg above knee: Secondary | ICD-10-CM

## 2016-05-25 ENCOUNTER — Encounter: Payer: Self-pay | Admitting: Vascular Surgery

## 2016-05-28 ENCOUNTER — Ambulatory Visit: Payer: Medicare Other | Admitting: Vascular Surgery

## 2016-05-28 ENCOUNTER — Encounter (HOSPITAL_COMMUNITY): Payer: Medicare Other

## 2016-06-01 DIAGNOSIS — I7789 Other specified disorders of arteries and arterioles: Secondary | ICD-10-CM | POA: Diagnosis not present

## 2016-06-01 DIAGNOSIS — I739 Peripheral vascular disease, unspecified: Secondary | ICD-10-CM | POA: Diagnosis not present

## 2016-06-01 DIAGNOSIS — Z89511 Acquired absence of right leg below knee: Secondary | ICD-10-CM | POA: Diagnosis not present

## 2016-06-01 DIAGNOSIS — M545 Low back pain: Secondary | ICD-10-CM | POA: Diagnosis not present

## 2016-06-01 DIAGNOSIS — Z299 Encounter for prophylactic measures, unspecified: Secondary | ICD-10-CM | POA: Diagnosis not present

## 2016-07-05 NOTE — Progress Notes (Deleted)
Established Critical Limb Ischemia Patient  History of Present Illness  Briana Mosley is a 50 y.o. (09/22/1966) female who presents with chief complaint: ***.  Prior procedure include:  1.  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  R CFA to BK pop BPG w/ ips NR GSV (03/06/15)  The patient has *** rest pain and wounds include: ***.  The patient notes symptoms have *** progressed.  The patient's treatment regimen currently included: maximal medical management and ***.   The patient's PMH, PSH, SH, and FamHx are unchanged from ***.  Current Outpatient Prescriptions  Medication Sig Dispense Refill  . acetaminophen-codeine (TYLENOL #3) 300-30 MG tablet Take 1-2 tablets by mouth every 6 (six) hours as needed for moderate pain. 30 tablet 0  . aspirin EC 81 MG tablet Take 1 tablet (81 mg total) by mouth daily.    . clonazePAM (KLONOPIN) 0.5 MG tablet Take 0.5 mg by mouth 3 (three) times daily as needed for anxiety.    . magnesium oxide (MAG-OX) 400 MG tablet Take 400 mg by mouth daily.    . nicotine (NICODERM CQ - DOSED IN MG/24 HOURS) 21 mg/24hr patch Place 1 patch (21 mg total) onto the skin daily. 28 patch 0  . Oxycodone HCl 10 MG TABS Take 1 tablet (10 mg total) by mouth 4 (four) times daily as needed (pain). 30 tablet 0  . simvastatin (ZOCOR) 40 MG tablet Take 40 mg by mouth daily.  5   No current facility-administered medications for this visit.     Allergies  Allergen Reactions  . Ciprofloxacin Other (See Comments)    hallucination  . Sulfa Antibiotics Rash    On ROS today: ***, ***   ***Physical Examination  There were no vitals filed for this visit.  There is no height or weight on file to calculate BMI.  General: {LOC:19197::"Somulent","Alert"}, {Orientation:19197::"Confused","O x 3"}, {Weight:19197::"Obese","Cachectic","WD"},{General state of health:19197::"Ill appearing","NAD"}  Pulmonary: {Chest wall:19197::"Asx chest movement","Sym exp"}, {Air movt:19197::"Decreased *** air  movt","good B air movt"},{BS:19197::"rales on ***","rhonchi on ***","wheezing on ***","CTA B"}  Cardiac: {Rhythm:19197::"Irregularly, irregular rate and rhythm","RRR, Nl S1, S2"}, {Murmur:19197::"Murmur present: ***","no Murmurs"}, {Rubs:19197::"Rub present: ***","No rubs"}, {Gallop:19197::"Gallop present: ***","No S3,S4"}  Vascular: Vessel Right Left  Radial {Palpable:19197::"Not palpable","Palpable"} {Palpable:19197::"Not palpable","Palpable"}  Brachial {Palpable:19197::"Not palpable","Palpable"} {Palpable:19197::"Not palpable","Palpable"}  Carotid Palpable, {Bruit:19197::"Bruit present","No Bruit"} Palpable, {Bruit:19197::"Bruit present","No Bruit"}  Aorta Not palpable N/A  Femoral {Palpable:19197::"Not palpable","Palpable"} {Palpable:19197::"Not palpable","Palpable"}  Popliteal AKA {Palpable:19197::"Prominently palpable","Not palpable"}  PT AKA {Palpable:19197::"Not palpable","Palpable"}  DP AKA {Palpable:19197::"Not palpable","Palpable"}   Gastrointestinal: soft, {Distension:19197::"distended","non-distended"}, {TTP:19197::"TTP in *** quadrant","appropriate tenderness to palpation","non-tender to palpation"}, {Guarding:19197::"Guarding and rebound in *** quadrant","No guarding or rebound"}, {HSM:19197::"HSM present","no HSM"}, {Masses:19197::"Mass present: ***","no masses"}, {Flank:19197::"CVAT on ***","Flank bruit present on ***","no CVAT B"}, {AAA:19197::"Palpable prominent aortic pulse","No palpable prominent aortic pulse"}, {Surgical incision:19197::"Surg incisions: ***","Surgical incisions well healed"," "}  Musculoskeletal: M/S 5/5 throughout {MS:19197::"except ***"," "}, Extremities without ischemic changes except R AKA, {Edema:19197::"Edema in *** legs","No edema present"}, {Varicosities:19197::"Varicosities present: ***"," "}, {LDS:19197::"LDS present: *** leg","No LDS present"}  Neurologic: CN 2-12 intact{CN:19197::"except ***"," "}, Pain and light touch intact in  extremities{CN:19197::"except ***"," "}, Motor exam as listed above   Non-invasive Studies  L ABI (Date: 07/05/2016)  L:   ABI: *** (***),   DP: {Signals:19197::"none","mono","bi","tri"}  PT: {Signals:19197::"none","mono","bi","tri"}  TBI: ***   Medical Decision Making  Briana Mosley is a 50 y.o. female who presents with: s/p R AKA, ***critical limb ischemia with *** wounds.   Based on the patient's vascular studies and examination, I have offered the  patient: ***.  I discussed in depth with the patient the nature of atherosclerosis, and emphasized the importance of maximal medical management including strict control of blood pressure, blood glucose, and lipid levels, antiplatelet agents, obtaining regular exercise, and cessation of smoking.    The patient is aware that without maximal medical management the underlying atherosclerotic disease process will progress, limiting the benefit of any interventions. The patient is currently on a statin:  Zocor. The patient is currently on an anti-platelet: ASA.  Thank you for allowing [place] to participate in this patient's care.   Adele Barthel, MD, FACS [facility] Office: [phone] Pager: [phone]

## 2016-07-06 ENCOUNTER — Encounter: Payer: Self-pay | Admitting: Vascular Surgery

## 2016-07-09 ENCOUNTER — Inpatient Hospital Stay (HOSPITAL_COMMUNITY)
Admission: RE | Admit: 2016-07-09 | Discharge: 2016-07-09 | Disposition: A | Discharge status: Home / Self Care | Payer: Medicare Other | Source: Ambulatory Visit | Attending: Vascular Surgery | Admitting: Vascular Surgery

## 2016-07-09 ENCOUNTER — Ambulatory Visit: Payer: Medicare Other | Admitting: Vascular Surgery

## 2016-07-09 DIAGNOSIS — I779 Disorder of arteries and arterioles, unspecified: Secondary | ICD-10-CM

## 2016-07-09 DIAGNOSIS — Z89611 Acquired absence of right leg above knee: Secondary | ICD-10-CM

## 2016-07-30 DIAGNOSIS — I739 Peripheral vascular disease, unspecified: Secondary | ICD-10-CM | POA: Diagnosis not present

## 2016-07-30 DIAGNOSIS — M549 Dorsalgia, unspecified: Secondary | ICD-10-CM | POA: Diagnosis not present

## 2016-07-30 DIAGNOSIS — Z299 Encounter for prophylactic measures, unspecified: Secondary | ICD-10-CM | POA: Diagnosis not present

## 2016-07-30 DIAGNOSIS — L98499 Non-pressure chronic ulcer of skin of other sites with unspecified severity: Secondary | ICD-10-CM | POA: Diagnosis not present

## 2016-07-30 DIAGNOSIS — Z6822 Body mass index (BMI) 22.0-22.9, adult: Secondary | ICD-10-CM | POA: Diagnosis not present

## 2016-08-20 DIAGNOSIS — Z1389 Encounter for screening for other disorder: Secondary | ICD-10-CM | POA: Diagnosis not present

## 2016-08-20 DIAGNOSIS — I739 Peripheral vascular disease, unspecified: Secondary | ICD-10-CM | POA: Diagnosis not present

## 2016-08-20 DIAGNOSIS — Z299 Encounter for prophylactic measures, unspecified: Secondary | ICD-10-CM | POA: Diagnosis not present

## 2016-08-20 DIAGNOSIS — E039 Hypothyroidism, unspecified: Secondary | ICD-10-CM | POA: Diagnosis not present

## 2016-08-20 DIAGNOSIS — Z0000 Encounter for general adult medical examination without abnormal findings: Secondary | ICD-10-CM | POA: Diagnosis not present

## 2016-08-20 DIAGNOSIS — F419 Anxiety disorder, unspecified: Secondary | ICD-10-CM | POA: Diagnosis not present

## 2016-08-20 DIAGNOSIS — Z1211 Encounter for screening for malignant neoplasm of colon: Secondary | ICD-10-CM | POA: Diagnosis not present

## 2016-08-20 DIAGNOSIS — Z7189 Other specified counseling: Secondary | ICD-10-CM | POA: Diagnosis not present

## 2016-08-20 DIAGNOSIS — R5383 Other fatigue: Secondary | ICD-10-CM | POA: Diagnosis not present

## 2016-08-20 DIAGNOSIS — Z79899 Other long term (current) drug therapy: Secondary | ICD-10-CM | POA: Diagnosis not present

## 2016-09-01 ENCOUNTER — Encounter: Payer: Self-pay | Admitting: Vascular Surgery

## 2016-09-03 ENCOUNTER — Inpatient Hospital Stay (HOSPITAL_COMMUNITY): Admission: RE | Admit: 2016-09-03 | Payer: Medicare Other | Source: Ambulatory Visit

## 2016-09-03 ENCOUNTER — Ambulatory Visit: Payer: Medicare Other | Admitting: Vascular Surgery

## 2016-09-03 DIAGNOSIS — M549 Dorsalgia, unspecified: Secondary | ICD-10-CM | POA: Diagnosis present

## 2016-09-03 DIAGNOSIS — Z833 Family history of diabetes mellitus: Secondary | ICD-10-CM | POA: Diagnosis not present

## 2016-09-03 DIAGNOSIS — E039 Hypothyroidism, unspecified: Secondary | ICD-10-CM | POA: Diagnosis present

## 2016-09-03 DIAGNOSIS — G8929 Other chronic pain: Secondary | ICD-10-CM | POA: Diagnosis not present

## 2016-09-03 DIAGNOSIS — E876 Hypokalemia: Secondary | ICD-10-CM | POA: Diagnosis not present

## 2016-09-03 DIAGNOSIS — B9620 Unspecified Escherichia coli [E. coli] as the cause of diseases classified elsewhere: Secondary | ICD-10-CM | POA: Diagnosis present

## 2016-09-03 DIAGNOSIS — Z881 Allergy status to other antibiotic agents status: Secondary | ICD-10-CM | POA: Diagnosis not present

## 2016-09-03 DIAGNOSIS — R531 Weakness: Secondary | ICD-10-CM | POA: Diagnosis present

## 2016-09-03 DIAGNOSIS — Z79891 Long term (current) use of opiate analgesic: Secondary | ICD-10-CM | POA: Diagnosis not present

## 2016-09-03 DIAGNOSIS — Z823 Family history of stroke: Secondary | ICD-10-CM | POA: Diagnosis not present

## 2016-09-03 DIAGNOSIS — Z888 Allergy status to other drugs, medicaments and biological substances status: Secondary | ICD-10-CM | POA: Diagnosis not present

## 2016-09-03 DIAGNOSIS — I739 Peripheral vascular disease, unspecified: Secondary | ICD-10-CM | POA: Diagnosis not present

## 2016-09-03 DIAGNOSIS — R509 Fever, unspecified: Secondary | ICD-10-CM | POA: Diagnosis not present

## 2016-09-03 DIAGNOSIS — Z79899 Other long term (current) drug therapy: Secondary | ICD-10-CM | POA: Diagnosis not present

## 2016-09-03 DIAGNOSIS — Z993 Dependence on wheelchair: Secondary | ICD-10-CM | POA: Diagnosis not present

## 2016-09-03 DIAGNOSIS — Z89511 Acquired absence of right leg below knee: Secondary | ICD-10-CM | POA: Diagnosis not present

## 2016-09-03 DIAGNOSIS — R5383 Other fatigue: Secondary | ICD-10-CM | POA: Diagnosis not present

## 2016-09-03 DIAGNOSIS — Z2821 Immunization not carried out because of patient refusal: Secondary | ICD-10-CM | POA: Diagnosis not present

## 2016-09-03 DIAGNOSIS — N390 Urinary tract infection, site not specified: Secondary | ICD-10-CM | POA: Diagnosis present

## 2016-09-03 DIAGNOSIS — R061 Stridor: Secondary | ICD-10-CM | POA: Diagnosis not present

## 2016-09-03 DIAGNOSIS — R270 Ataxia, unspecified: Secondary | ICD-10-CM | POA: Diagnosis present

## 2016-09-03 DIAGNOSIS — Z7982 Long term (current) use of aspirin: Secondary | ICD-10-CM | POA: Diagnosis not present

## 2016-09-03 DIAGNOSIS — J189 Pneumonia, unspecified organism: Secondary | ICD-10-CM | POA: Diagnosis present

## 2016-09-03 DIAGNOSIS — K449 Diaphragmatic hernia without obstruction or gangrene: Secondary | ICD-10-CM | POA: Diagnosis not present

## 2016-09-03 DIAGNOSIS — R0902 Hypoxemia: Secondary | ICD-10-CM | POA: Diagnosis not present

## 2016-09-03 DIAGNOSIS — I482 Chronic atrial fibrillation: Secondary | ICD-10-CM | POA: Diagnosis not present

## 2016-09-03 DIAGNOSIS — J181 Lobar pneumonia, unspecified organism: Secondary | ICD-10-CM | POA: Diagnosis not present

## 2016-09-03 DIAGNOSIS — Z801 Family history of malignant neoplasm of trachea, bronchus and lung: Secondary | ICD-10-CM | POA: Diagnosis not present

## 2016-09-13 DIAGNOSIS — Z6822 Body mass index (BMI) 22.0-22.9, adult: Secondary | ICD-10-CM | POA: Diagnosis not present

## 2016-09-13 DIAGNOSIS — Z299 Encounter for prophylactic measures, unspecified: Secondary | ICD-10-CM | POA: Diagnosis not present

## 2016-09-13 DIAGNOSIS — I739 Peripheral vascular disease, unspecified: Secondary | ICD-10-CM | POA: Diagnosis not present

## 2016-09-13 DIAGNOSIS — N390 Urinary tract infection, site not specified: Secondary | ICD-10-CM | POA: Diagnosis not present

## 2016-09-13 DIAGNOSIS — J189 Pneumonia, unspecified organism: Secondary | ICD-10-CM | POA: Diagnosis not present

## 2016-09-15 DIAGNOSIS — R270 Ataxia, unspecified: Secondary | ICD-10-CM | POA: Diagnosis not present

## 2016-09-15 DIAGNOSIS — Z87440 Personal history of urinary (tract) infections: Secondary | ICD-10-CM | POA: Diagnosis not present

## 2016-09-15 DIAGNOSIS — G8929 Other chronic pain: Secondary | ICD-10-CM | POA: Diagnosis not present

## 2016-09-15 DIAGNOSIS — Z7982 Long term (current) use of aspirin: Secondary | ICD-10-CM | POA: Diagnosis not present

## 2016-09-15 DIAGNOSIS — M549 Dorsalgia, unspecified: Secondary | ICD-10-CM | POA: Diagnosis not present

## 2016-09-15 DIAGNOSIS — Z89511 Acquired absence of right leg below knee: Secondary | ICD-10-CM | POA: Diagnosis not present

## 2016-09-15 DIAGNOSIS — J181 Lobar pneumonia, unspecified organism: Secondary | ICD-10-CM | POA: Diagnosis not present

## 2016-09-15 DIAGNOSIS — I739 Peripheral vascular disease, unspecified: Secondary | ICD-10-CM | POA: Diagnosis not present

## 2016-09-16 DIAGNOSIS — J181 Lobar pneumonia, unspecified organism: Secondary | ICD-10-CM | POA: Diagnosis not present

## 2016-09-16 DIAGNOSIS — Z87440 Personal history of urinary (tract) infections: Secondary | ICD-10-CM | POA: Diagnosis not present

## 2016-09-16 DIAGNOSIS — I739 Peripheral vascular disease, unspecified: Secondary | ICD-10-CM | POA: Diagnosis not present

## 2016-09-16 DIAGNOSIS — M549 Dorsalgia, unspecified: Secondary | ICD-10-CM | POA: Diagnosis not present

## 2016-09-16 DIAGNOSIS — G8929 Other chronic pain: Secondary | ICD-10-CM | POA: Diagnosis not present

## 2016-09-16 DIAGNOSIS — R270 Ataxia, unspecified: Secondary | ICD-10-CM | POA: Diagnosis not present

## 2016-09-17 DIAGNOSIS — G8929 Other chronic pain: Secondary | ICD-10-CM | POA: Diagnosis not present

## 2016-09-17 DIAGNOSIS — Z87440 Personal history of urinary (tract) infections: Secondary | ICD-10-CM | POA: Diagnosis not present

## 2016-09-17 DIAGNOSIS — M549 Dorsalgia, unspecified: Secondary | ICD-10-CM | POA: Diagnosis not present

## 2016-09-17 DIAGNOSIS — J181 Lobar pneumonia, unspecified organism: Secondary | ICD-10-CM | POA: Diagnosis not present

## 2016-09-17 DIAGNOSIS — R270 Ataxia, unspecified: Secondary | ICD-10-CM | POA: Diagnosis not present

## 2016-09-17 DIAGNOSIS — I739 Peripheral vascular disease, unspecified: Secondary | ICD-10-CM | POA: Diagnosis not present

## 2016-09-22 DIAGNOSIS — R270 Ataxia, unspecified: Secondary | ICD-10-CM | POA: Diagnosis not present

## 2016-09-22 DIAGNOSIS — Z87440 Personal history of urinary (tract) infections: Secondary | ICD-10-CM | POA: Diagnosis not present

## 2016-09-22 DIAGNOSIS — J181 Lobar pneumonia, unspecified organism: Secondary | ICD-10-CM | POA: Diagnosis not present

## 2016-09-22 DIAGNOSIS — I739 Peripheral vascular disease, unspecified: Secondary | ICD-10-CM | POA: Diagnosis not present

## 2016-09-22 DIAGNOSIS — G8929 Other chronic pain: Secondary | ICD-10-CM | POA: Diagnosis not present

## 2016-09-22 DIAGNOSIS — M549 Dorsalgia, unspecified: Secondary | ICD-10-CM | POA: Diagnosis not present

## 2016-09-22 DIAGNOSIS — N390 Urinary tract infection, site not specified: Secondary | ICD-10-CM | POA: Diagnosis not present

## 2016-09-24 DIAGNOSIS — I739 Peripheral vascular disease, unspecified: Secondary | ICD-10-CM | POA: Diagnosis not present

## 2016-09-24 DIAGNOSIS — R270 Ataxia, unspecified: Secondary | ICD-10-CM | POA: Diagnosis not present

## 2016-09-24 DIAGNOSIS — G8929 Other chronic pain: Secondary | ICD-10-CM | POA: Diagnosis not present

## 2016-09-24 DIAGNOSIS — M549 Dorsalgia, unspecified: Secondary | ICD-10-CM | POA: Diagnosis not present

## 2016-09-24 DIAGNOSIS — J181 Lobar pneumonia, unspecified organism: Secondary | ICD-10-CM | POA: Diagnosis not present

## 2016-09-24 DIAGNOSIS — Z87440 Personal history of urinary (tract) infections: Secondary | ICD-10-CM | POA: Diagnosis not present

## 2016-09-29 DIAGNOSIS — I739 Peripheral vascular disease, unspecified: Secondary | ICD-10-CM | POA: Diagnosis not present

## 2016-09-29 DIAGNOSIS — R270 Ataxia, unspecified: Secondary | ICD-10-CM | POA: Diagnosis not present

## 2016-09-29 DIAGNOSIS — G8929 Other chronic pain: Secondary | ICD-10-CM | POA: Diagnosis not present

## 2016-09-29 DIAGNOSIS — Z87440 Personal history of urinary (tract) infections: Secondary | ICD-10-CM | POA: Diagnosis not present

## 2016-09-29 DIAGNOSIS — M549 Dorsalgia, unspecified: Secondary | ICD-10-CM | POA: Diagnosis not present

## 2016-09-29 DIAGNOSIS — J181 Lobar pneumonia, unspecified organism: Secondary | ICD-10-CM | POA: Diagnosis not present

## 2016-09-30 DIAGNOSIS — R270 Ataxia, unspecified: Secondary | ICD-10-CM | POA: Diagnosis not present

## 2016-09-30 DIAGNOSIS — G8929 Other chronic pain: Secondary | ICD-10-CM | POA: Diagnosis not present

## 2016-09-30 DIAGNOSIS — M549 Dorsalgia, unspecified: Secondary | ICD-10-CM | POA: Diagnosis not present

## 2016-09-30 DIAGNOSIS — Z87440 Personal history of urinary (tract) infections: Secondary | ICD-10-CM | POA: Diagnosis not present

## 2016-09-30 DIAGNOSIS — I739 Peripheral vascular disease, unspecified: Secondary | ICD-10-CM | POA: Diagnosis not present

## 2016-09-30 DIAGNOSIS — J181 Lobar pneumonia, unspecified organism: Secondary | ICD-10-CM | POA: Diagnosis not present

## 2016-10-19 ENCOUNTER — Encounter: Payer: Self-pay | Admitting: Vascular Surgery

## 2016-10-22 ENCOUNTER — Ambulatory Visit (HOSPITAL_COMMUNITY): Payer: Medicare Other

## 2016-10-22 ENCOUNTER — Ambulatory Visit: Payer: Medicare Other | Admitting: Vascular Surgery

## 2016-10-29 DIAGNOSIS — I10 Essential (primary) hypertension: Secondary | ICD-10-CM | POA: Diagnosis not present

## 2016-10-29 DIAGNOSIS — M549 Dorsalgia, unspecified: Secondary | ICD-10-CM | POA: Diagnosis not present

## 2016-10-29 DIAGNOSIS — F419 Anxiety disorder, unspecified: Secondary | ICD-10-CM | POA: Diagnosis not present

## 2016-10-29 DIAGNOSIS — Z299 Encounter for prophylactic measures, unspecified: Secondary | ICD-10-CM | POA: Diagnosis not present

## 2016-10-29 DIAGNOSIS — E039 Hypothyroidism, unspecified: Secondary | ICD-10-CM | POA: Diagnosis not present

## 2016-10-29 DIAGNOSIS — Z79899 Other long term (current) drug therapy: Secondary | ICD-10-CM | POA: Diagnosis not present

## 2016-10-29 DIAGNOSIS — F17210 Nicotine dependence, cigarettes, uncomplicated: Secondary | ICD-10-CM | POA: Diagnosis not present

## 2016-10-29 DIAGNOSIS — Z6822 Body mass index (BMI) 22.0-22.9, adult: Secondary | ICD-10-CM | POA: Diagnosis not present

## 2016-11-09 ENCOUNTER — Encounter: Payer: Self-pay | Admitting: Cardiology

## 2016-11-24 IMAGING — DX DG CHEST 2V
2 series · 2 of 2 positions shown · non-contrast
Comparison: AP chest x-ray dated November 29, 2011

CLINICAL DATA: COPD, long-term tobacco use, peripheral vascular
disease.

EXAM:
CHEST  2 VIEW

[chest pa]
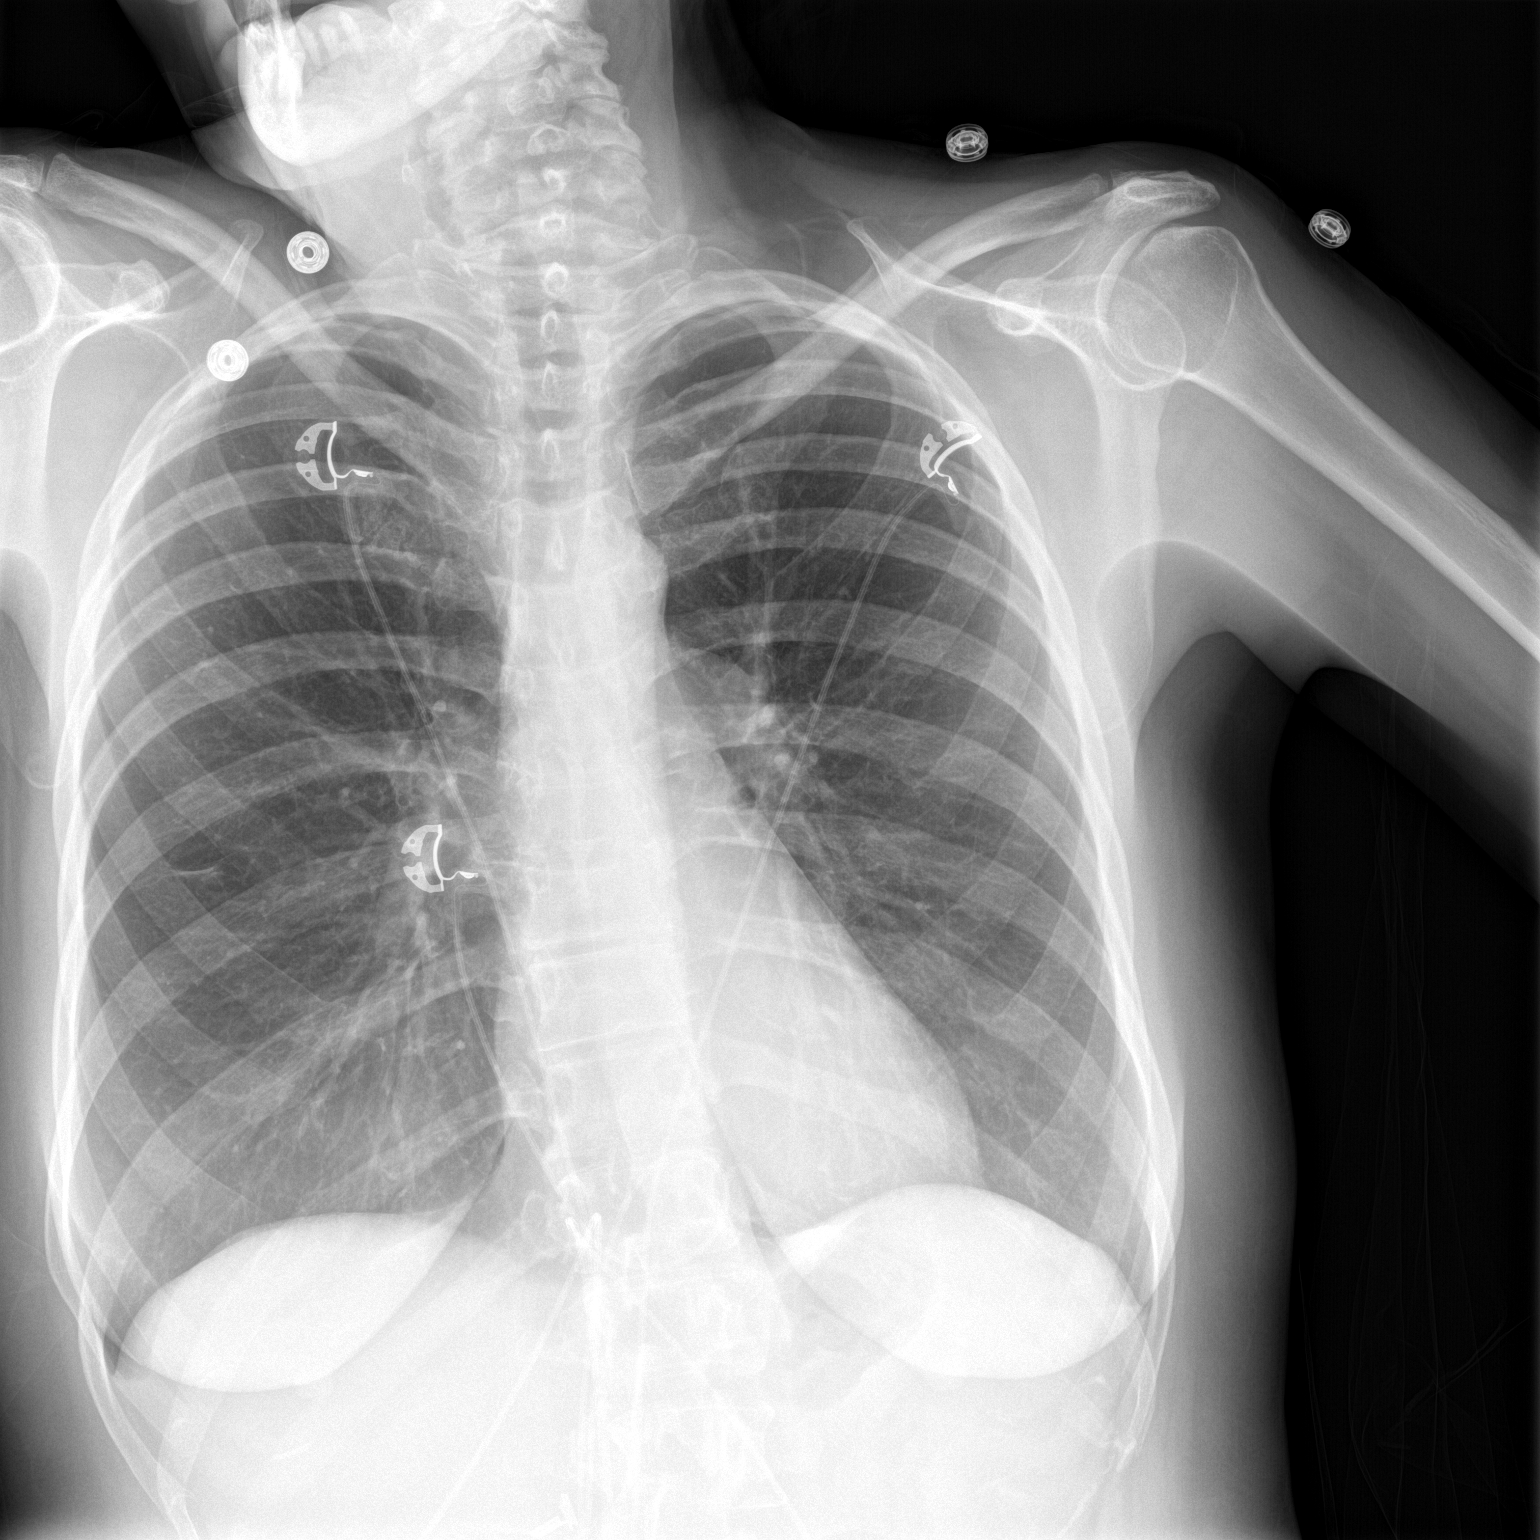

[chest lat]
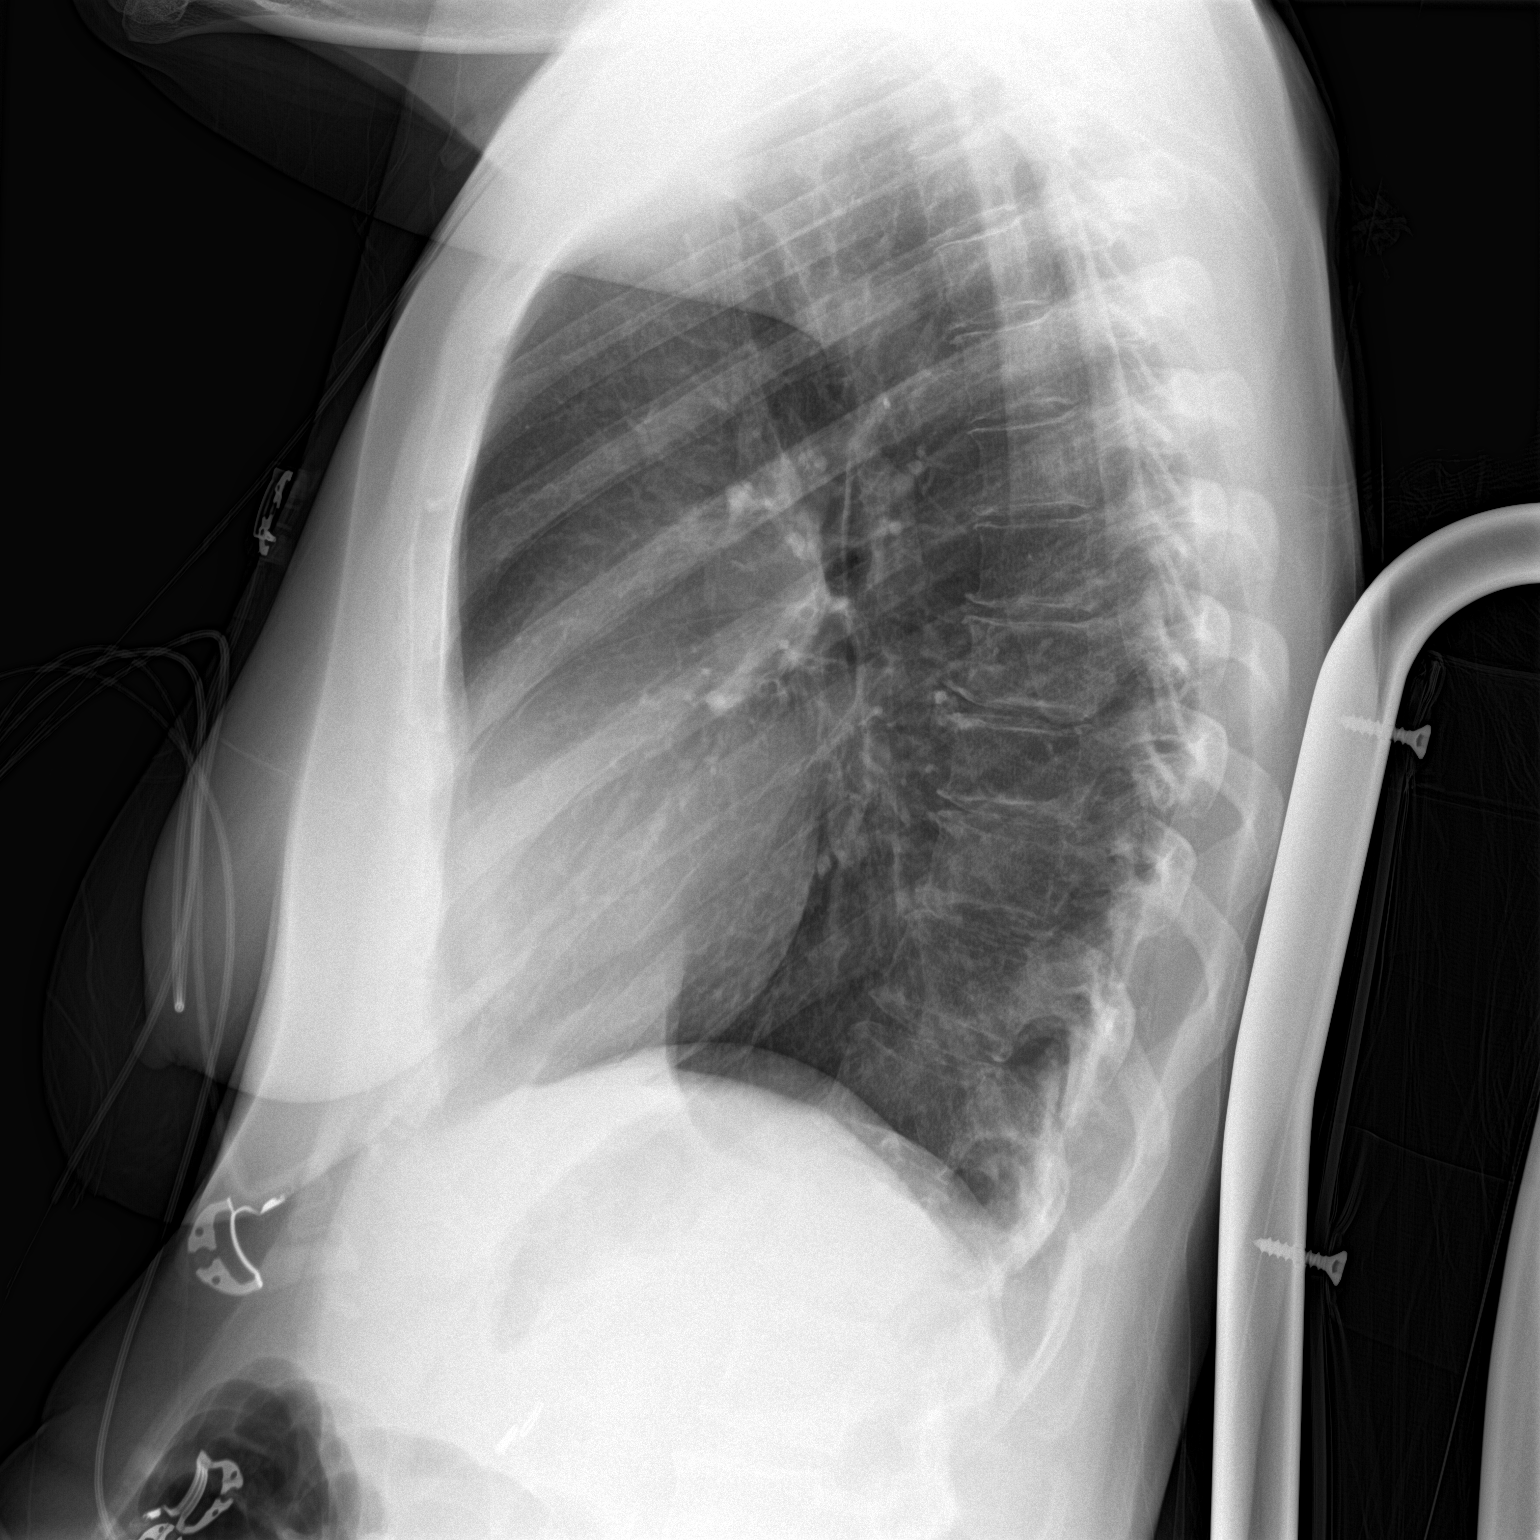

[2 of 2 positions shown; findings below may reference images not displayed]

FINDINGS: The lungs are mildly hyperinflated and clear. The heart and
pulmonary vascularity are normal. The mediastinum is normal in
width. There is no pleural effusion. There is stable levocurvature
of the thoracolumbar spine.
IMPRESSION: Hyperinflation consistent with COPD. There is no active
cardiopulmonary disease.

## 2016-12-24 DIAGNOSIS — I10 Essential (primary) hypertension: Secondary | ICD-10-CM | POA: Diagnosis not present

## 2016-12-24 DIAGNOSIS — I739 Peripheral vascular disease, unspecified: Secondary | ICD-10-CM | POA: Diagnosis not present

## 2016-12-24 DIAGNOSIS — Z713 Dietary counseling and surveillance: Secondary | ICD-10-CM | POA: Diagnosis not present

## 2016-12-24 DIAGNOSIS — I7789 Other specified disorders of arteries and arterioles: Secondary | ICD-10-CM | POA: Diagnosis not present

## 2016-12-24 DIAGNOSIS — Z299 Encounter for prophylactic measures, unspecified: Secondary | ICD-10-CM | POA: Diagnosis not present

## 2016-12-24 DIAGNOSIS — E039 Hypothyroidism, unspecified: Secondary | ICD-10-CM | POA: Diagnosis not present

## 2016-12-24 DIAGNOSIS — F419 Anxiety disorder, unspecified: Secondary | ICD-10-CM | POA: Diagnosis not present

## 2016-12-24 DIAGNOSIS — F329 Major depressive disorder, single episode, unspecified: Secondary | ICD-10-CM | POA: Diagnosis not present

## 2016-12-24 DIAGNOSIS — M549 Dorsalgia, unspecified: Secondary | ICD-10-CM | POA: Diagnosis not present

## 2016-12-24 DIAGNOSIS — F17210 Nicotine dependence, cigarettes, uncomplicated: Secondary | ICD-10-CM | POA: Diagnosis not present

## 2017-01-10 ENCOUNTER — Encounter: Payer: Self-pay | Admitting: Vascular Surgery

## 2017-01-17 NOTE — Progress Notes (Deleted)
Established Critical Limb Ischemia Patient  History of Present Illness  Briana Mosley is a 51 y.o. ([birth year]) female who presents with chief complaint: ***.  The patient's prior procedures include:  1. 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 Revision of distal anastomosis (03/16/15) 3. R CFA to BK pop BPG w/ ips NR GSV (03/06/15)  The patient has *** rest pain and wounds include: ***.  The patient notes symptoms have *** progressed.  The patient's treatment regimen currently included: maximal medical management and ***.   The patient's PMH, PSH, SH, and FamHx are unchanged from 03/02/16.  Current Outpatient Prescriptions  Medication Sig Dispense Refill  . acetaminophen-codeine (TYLENOL #3) 300-30 MG tablet Take 1-2 tablets by mouth every 6 (six) hours as needed for moderate pain. 30 tablet 0  . aspirin EC 81 MG tablet Take 1 tablet (81 mg total) by mouth daily.    . clonazePAM (KLONOPIN) 0.5 MG tablet Take 0.5 mg by mouth 3 (three) times daily as needed for anxiety.    . magnesium oxide (MAG-OX) 400 MG tablet Take 400 mg by mouth daily.    . nicotine (NICODERM CQ - DOSED IN MG/24 HOURS) 21 mg/24hr patch Place 1 patch (21 mg total) onto the skin daily. 28 patch 0  . Oxycodone HCl 10 MG TABS Take 1 tablet (10 mg total) by mouth 4 (four) times daily as needed (pain). 30 tablet 0  . simvastatin (ZOCOR) 40 MG tablet Take 40 mg by mouth daily.  5   No current facility-administered medications for this visit.     Allergies  Allergen Reactions  . Ciprofloxacin Other (See Comments)    hallucination  . Sulfa Antibiotics Rash    On ROS today: ***, ***   ***Physical Examination  There were no vitals filed for this visit.  There is no height or weight on file to calculate BMI.  General: {LOC:19197::"Somulent","Alert"}, {Orientation:19197::"Confused","O x 3"}, {Weight:19197::"Obese","Cachectic","WD"},{General state of health:19197::"Ill appearing","NAD"}  Pulmonary: {Chest wall:19197::"Asx chest  movement","Sym exp"}, {Air movt:19197::"Decreased *** air movt","good B air movt"},{BS:19197::"rales on ***","rhonchi on ***","wheezing on ***","CTA B"}  Cardiac: {Rhythm:19197::"Irregularly, irregular rate and rhythm","RRR, Nl S1, S2"}, {Murmur:19197::"Murmur present: ***","no Murmurs"}, {Rubs:19197::"Rub present: ***","No rubs"}, {Gallop:19197::"Gallop present: ***","No S3,S4"}  Vascular: Vessel Right Left  Radial {Palpable:19197::"Not palpable","Faintly palpable","Palpable"} {Palpable:19197::"Not palpable","Faintly palpable","Palpable"}  Brachial {Palpable:19197::"Not palpable","Faintly palpable","Palpable"} {Palpable:19197::"Not palpable","Faintly palpable","Palpable"}  Carotid Palpable, {Bruit:19197::"Bruit present","No Bruit"} Palpable, {Bruit:19197::"Bruit present","No Bruit"}  Aorta Not palpable N/A  Femoral {Palpable:19197::"Not palpable","Faintly palpable","Palpable"} {Palpable:19197::"Not palpable","Faintly palpable","Palpable"}  Popliteal {Palpable:19197::"Prominently palpable","Not palpable"} {Palpable:19197::"Prominently palpable","Not palpable"}  PT {Palpable:19197::"Not palpable","Faintly palpable","Palpable"} {Palpable:19197::"Not palpable","Faintly palpable","Palpable"}  DP {Palpable:19197::"Not palpable","Faintly palpable","Palpable"} {Palpable:19197::"Not palpable","Faintly palpable","Palpable"}   Gastrointestinal: soft, {Distension:19197::"distended","non-distended"}, {TTP:19197::"TTP in *** quadrant","appropriate tenderness to palpation","non-tender to palpation"}, {Guarding:19197::"Guarding and rebound in *** quadrant","No guarding or rebound"}, {HSM:19197::"HSM present","no HSM"}, {Masses:19197::"Mass present: ***","no masses"}, {Flank:19197::"CVAT on ***","Flank bruit present on ***","no CVAT B"}, {AAA:19197::"Palpable prominent aortic pulse","No palpable prominent aortic pulse"}, {Surgical incision:19197::"Surg incisions: ***","Surgical incisions well healed","  "}  Musculoskeletal: M/S 5/5 throughout {MS:19197::"except ***"," "}, Extremities without ischemic changes {MS:19197::"except ***"," "}, {Edema:19197::"Edema in *** legs","No edema present"}, {Varicosities:19197::"Varicosities present: ***"," "}, {LDS:19197::"LDS present: *** leg","No LDS present"}  Neurologic: CN 2-12 intact{CN:19197::"except ***"," "}, Pain and light touch intact in extremities{CN:19197::"except ***"," "}, Motor exam as listed above   Non-invasive Studies  ABI (Date: ***)  R:   AKA  L:   ABI: *** (0.58),   DP: {Signals:19197::"none","mono","bi","tri"}  PT: {Signals:19197::"none","mono","bi","tri"}  TBI: ***   Medical Decision Making  Briana Mosley is a 51 y.o. female who presents  with: ***critical limb ischemia with *** wounds, s/p R AKA   Based on the patient's vascular studies and examination, I have offered the patient: ***.  I discussed in depth with the patient the nature of atherosclerosis, and emphasized the importance of maximal medical management including strict control of blood pressure, blood glucose, and lipid levels, antiplatelet agents, obtaining regular exercise, and cessation of smoking.    The patient is aware that without maximal medical management the underlying atherosclerotic disease process will progress, limiting the benefit of any interventions. The patient is currently on a statin: Zocor.  The patient is currently on an anti-platelet: ASA.  Thank you for allowing [place] to participate in this patient's care.   Adele Barthel, MD, FACS [facility] Office: [phone] Pager: [phone]

## 2017-01-21 ENCOUNTER — Ambulatory Visit: Payer: Medicare Other | Admitting: Vascular Surgery

## 2017-01-21 ENCOUNTER — Ambulatory Visit (HOSPITAL_COMMUNITY): Payer: Medicare Other

## 2017-02-23 DIAGNOSIS — F17210 Nicotine dependence, cigarettes, uncomplicated: Secondary | ICD-10-CM | POA: Diagnosis not present

## 2017-02-23 DIAGNOSIS — Z6823 Body mass index (BMI) 23.0-23.9, adult: Secondary | ICD-10-CM | POA: Diagnosis not present

## 2017-02-23 DIAGNOSIS — M549 Dorsalgia, unspecified: Secondary | ICD-10-CM | POA: Diagnosis not present

## 2017-02-23 DIAGNOSIS — Z89611 Acquired absence of right leg above knee: Secondary | ICD-10-CM | POA: Diagnosis not present

## 2017-02-23 DIAGNOSIS — I10 Essential (primary) hypertension: Secondary | ICD-10-CM | POA: Diagnosis not present

## 2017-02-23 DIAGNOSIS — Z79899 Other long term (current) drug therapy: Secondary | ICD-10-CM | POA: Diagnosis not present

## 2017-02-23 DIAGNOSIS — F419 Anxiety disorder, unspecified: Secondary | ICD-10-CM | POA: Diagnosis not present

## 2017-02-23 DIAGNOSIS — F329 Major depressive disorder, single episode, unspecified: Secondary | ICD-10-CM | POA: Diagnosis not present

## 2017-02-23 DIAGNOSIS — I739 Peripheral vascular disease, unspecified: Secondary | ICD-10-CM | POA: Diagnosis not present

## 2017-02-23 DIAGNOSIS — I7789 Other specified disorders of arteries and arterioles: Secondary | ICD-10-CM | POA: Diagnosis not present

## 2017-02-23 DIAGNOSIS — Z299 Encounter for prophylactic measures, unspecified: Secondary | ICD-10-CM | POA: Diagnosis not present

## 2017-02-23 DIAGNOSIS — E039 Hypothyroidism, unspecified: Secondary | ICD-10-CM | POA: Diagnosis not present

## 2017-04-22 ENCOUNTER — Encounter (HOSPITAL_COMMUNITY): Payer: Medicare Other

## 2017-04-22 ENCOUNTER — Ambulatory Visit: Payer: Medicare Other | Admitting: Vascular Surgery

## 2017-04-26 DIAGNOSIS — Z299 Encounter for prophylactic measures, unspecified: Secondary | ICD-10-CM | POA: Diagnosis not present

## 2017-04-26 DIAGNOSIS — F419 Anxiety disorder, unspecified: Secondary | ICD-10-CM | POA: Diagnosis not present

## 2017-04-26 DIAGNOSIS — I10 Essential (primary) hypertension: Secondary | ICD-10-CM | POA: Diagnosis not present

## 2017-04-26 DIAGNOSIS — Z89511 Acquired absence of right leg below knee: Secondary | ICD-10-CM | POA: Diagnosis not present

## 2017-04-26 DIAGNOSIS — E039 Hypothyroidism, unspecified: Secondary | ICD-10-CM | POA: Diagnosis not present

## 2017-04-26 DIAGNOSIS — M79606 Pain in leg, unspecified: Secondary | ICD-10-CM | POA: Diagnosis not present

## 2017-06-22 DIAGNOSIS — M549 Dorsalgia, unspecified: Secondary | ICD-10-CM | POA: Diagnosis not present

## 2017-06-22 DIAGNOSIS — M79606 Pain in leg, unspecified: Secondary | ICD-10-CM | POA: Diagnosis not present

## 2017-06-22 DIAGNOSIS — E039 Hypothyroidism, unspecified: Secondary | ICD-10-CM | POA: Diagnosis not present

## 2017-06-22 DIAGNOSIS — Z299 Encounter for prophylactic measures, unspecified: Secondary | ICD-10-CM | POA: Diagnosis not present

## 2017-06-22 DIAGNOSIS — F17210 Nicotine dependence, cigarettes, uncomplicated: Secondary | ICD-10-CM | POA: Diagnosis not present

## 2017-06-22 DIAGNOSIS — I10 Essential (primary) hypertension: Secondary | ICD-10-CM | POA: Diagnosis not present

## 2017-06-22 DIAGNOSIS — I7789 Other specified disorders of arteries and arterioles: Secondary | ICD-10-CM | POA: Diagnosis not present

## 2017-06-22 DIAGNOSIS — Z6823 Body mass index (BMI) 23.0-23.9, adult: Secondary | ICD-10-CM | POA: Diagnosis not present

## 2017-06-22 DIAGNOSIS — I739 Peripheral vascular disease, unspecified: Secondary | ICD-10-CM | POA: Diagnosis not present

## 2017-06-24 ENCOUNTER — Ambulatory Visit: Payer: Medicare Other | Admitting: Vascular Surgery

## 2017-06-24 ENCOUNTER — Encounter (HOSPITAL_COMMUNITY): Payer: Medicare Other

## 2017-08-04 ENCOUNTER — Other Ambulatory Visit: Payer: Self-pay

## 2017-08-04 DIAGNOSIS — I739 Peripheral vascular disease, unspecified: Secondary | ICD-10-CM

## 2017-08-05 ENCOUNTER — Ambulatory Visit: Payer: Medicare Other | Admitting: Vascular Surgery

## 2017-08-05 ENCOUNTER — Encounter (HOSPITAL_COMMUNITY): Payer: Medicare Other

## 2017-08-12 ENCOUNTER — Ambulatory Visit: Payer: Medicare Other | Admitting: Vascular Surgery

## 2017-08-12 ENCOUNTER — Encounter (HOSPITAL_COMMUNITY): Payer: Medicare Other

## 2017-08-22 DIAGNOSIS — Z1339 Encounter for screening examination for other mental health and behavioral disorders: Secondary | ICD-10-CM | POA: Diagnosis not present

## 2017-08-22 DIAGNOSIS — Z7189 Other specified counseling: Secondary | ICD-10-CM | POA: Diagnosis not present

## 2017-08-22 DIAGNOSIS — E7800 Pure hypercholesterolemia, unspecified: Secondary | ICD-10-CM | POA: Diagnosis not present

## 2017-08-22 DIAGNOSIS — Z6823 Body mass index (BMI) 23.0-23.9, adult: Secondary | ICD-10-CM | POA: Diagnosis not present

## 2017-08-22 DIAGNOSIS — Z79899 Other long term (current) drug therapy: Secondary | ICD-10-CM | POA: Diagnosis not present

## 2017-08-22 DIAGNOSIS — Z1211 Encounter for screening for malignant neoplasm of colon: Secondary | ICD-10-CM | POA: Diagnosis not present

## 2017-08-22 DIAGNOSIS — I10 Essential (primary) hypertension: Secondary | ICD-10-CM | POA: Diagnosis not present

## 2017-08-22 DIAGNOSIS — Z1331 Encounter for screening for depression: Secondary | ICD-10-CM | POA: Diagnosis not present

## 2017-08-22 DIAGNOSIS — Z0000 Encounter for general adult medical examination without abnormal findings: Secondary | ICD-10-CM | POA: Diagnosis not present

## 2017-08-22 DIAGNOSIS — Z299 Encounter for prophylactic measures, unspecified: Secondary | ICD-10-CM | POA: Diagnosis not present

## 2017-08-22 DIAGNOSIS — M545 Low back pain: Secondary | ICD-10-CM | POA: Diagnosis not present

## 2017-08-22 DIAGNOSIS — R5383 Other fatigue: Secondary | ICD-10-CM | POA: Diagnosis not present

## 2017-10-07 ENCOUNTER — Ambulatory Visit: Payer: Medicare Other | Admitting: Vascular Surgery

## 2017-10-07 ENCOUNTER — Encounter (HOSPITAL_COMMUNITY): Payer: Medicare Other

## 2017-10-07 NOTE — Progress Notes (Deleted)
Established Critical Limb Ischemia Patient   History of Present Illness   Briana Mosley is a 52 y.o. (10/04/1966) female who presents with chief complaint: ***.   Prior procedures include: 1.  R AKA by Kellie Simmering (01/28/16) 2.  Revision R pop anastomosis (03/16/15) 3.  R CFA to BK pop BPG w/ ips NR GSV (03/06/15)  The patient has been lost to follow up.  The patient has *** rest pain and wounds include: ***.  The patient notes symptoms have *** progressed.  The patient's treatment regimen currently included: maximal medical management and ***.  The patient's PMH, PSH, SH, and FamHx are unchanged from 01/30/16.  Current Outpatient Medications  Medication Sig Dispense Refill  . acetaminophen-codeine (TYLENOL #3) 300-30 MG tablet Take 1-2 tablets by mouth every 6 (six) hours as needed for moderate pain. 30 tablet 0  . aspirin EC 81 MG tablet Take 1 tablet (81 mg total) by mouth daily.    . clonazePAM (KLONOPIN) 0.5 MG tablet Take 0.5 mg by mouth 3 (three) times daily as needed for anxiety.    . magnesium oxide (MAG-OX) 400 MG tablet Take 400 mg by mouth daily.    . nicotine (NICODERM CQ - DOSED IN MG/24 HOURS) 21 mg/24hr patch Place 1 patch (21 mg total) onto the skin daily. 28 patch 0  . Oxycodone HCl 10 MG TABS Take 1 tablet (10 mg total) by mouth 4 (four) times daily as needed (pain). 30 tablet 0  . simvastatin (ZOCOR) 40 MG tablet Take 40 mg by mouth daily.  5   No current facility-administered medications for this visit.     On ROS today: ***, ***   Physical Examination  ***There were no vitals filed for this visit. ***There is no height or weight on file to calculate BMI.  General {LOC:19197::"Somulent","Alert"}, {Orientation:19197::"Confused","O x 3"}, {Weight:19197::"Obese","Cachectic","WD"}, {General state of health:19197::"Ill appearing","Elderly","NAD"}  Pulmonary {Chest wall:19197::"Asx chest movement","Sym exp"}, {Air movt:19197::"Decreased *** air movt","good B air  movt"}, {BS:19197::"rales on ***","rhonchi on ***","wheezing on ***","CTA B"}  Cardiac {Rhythm:19197::"Irregularly, irregular rate and rhythm","RRR, Nl S1, S2"}, {Murmur:19197::"Murmur present: ***","no Murmurs"}, {Rubs:19197::"Rub present: ***","No rubs"}, {Gallop:19197::"Gallop present: ***","No S3,S4"}  Vascular Vessel Right Left  Radial {Palpable:19197::"Not palpable","Faintly palpable","Palpable"} {Palpable:19197::"Not palpable","Faintly palpable","Palpable"}  Brachial {Palpable:19197::"Not palpable","Faintly palpable","Palpable"} {Palpable:19197::"Not palpable","Faintly palpable","Palpable"}  Carotid Palpable, {Bruit:19197::"Bruit present","No Bruit"} Palpable, {Bruit:19197::"Bruit present","No Bruit"}  Aorta Not palpable N/A  Femoral {Palpable:19197::"Not palpable","Faintly palpable","Palpable"} {Palpable:19197::"Not palpable","Faintly palpable","Palpable"}  Popliteal {Palpable:19197::"Prominently palpable","Not palpable"} {Palpable:19197::"Prominently palpable","Not palpable"}  PT {Palpable:19197::"Not palpable","Faintly palpable","Palpable"} {Palpable:19197::"Not palpable","Faintly palpable","Palpable"}  DP {Palpable:19197::"Not palpable","Faintly palpable","Palpable"} {Palpable:19197::"Not palpable","Faintly palpable","Palpable"}    Gastro- intestinal soft, {Distension:19197::"distended","non-distended"}, {TTP:19197::"TTP in *** quadrant","appropriate tenderness to palpation","non-tender to palpation"}, {Guarding:19197::"Guarding and rebound in *** quadrant","No guarding or rebound"}, {HSM:19197::"HSM present","no HSM"}, {Masses:19197::"Mass present: ***","no masses"}, {Flank:19197::"CVAT on ***","Flank bruit present on ***","no CVAT B"}, {AAA:19197::"Palpable prominent aortic pulse","No palpable prominent aortic pulse"}, {Surgical incision:19197::"Surg incisions: ***","Surgical incisions well healed"," "}  Musculo- skeletal M/S 5/5 throughout {MS:19197::"except ***"," "}, Extremities  without ischemic changes {MS:19197::"except ***"," "}, {Edema:19197::"Pitting edema present: ***","Non-pitting edema present: ***","No edema present"}, {Varicosities:19197::"Varicosities present: ***","No visible varicosities "}, {LDS:19197::"Lipodermatosclerosis present: ***","No Lipodermatosclerosis present"}  Neurologic Cranial nerves 2-12 intact{CN:19197::" except ***"," "}, Pain and light touch intact in extremities{CN:19197::" except for decreased sensation in ***"," "}, Motor exam as listed above    Non-Invasive Vascular Imaging   ABI (***)  R:   ABI: *** (***),   PT: {Signals:19197::"none","mono","bi","tri"}  DP: {Signals:19197::"none","mono","bi","tri"}  TBI:  ***  L:   ABI: *** (***),   PT: {Signals:19197::"none","mono","bi","tri"}  DP: {Signals:19197::"none","mono","bi","tri"}  TBI: ***   Medical Decision Making   Briana Mosley is a 52 y.o. female who presents with: s/p R AKA after failed R CFA to BK pop BPG,   Based on the patient's vascular studies and examination, I have offered the patient: ***.  I discussed in depth with the patient the nature of atherosclerosis, and emphasized the importance of maximal medical management including strict control of blood pressure, blood glucose, and lipid levels, antiplatelet agents, obtaining regular exercise, and cessation of smoking.    The patient is aware that without maximal medical management the underlying atherosclerotic disease process will progress, limiting the benefit of any interventions. The patient is currently {Statin:19197::"not on on statin as not medically indicated.","not on a statin due to reported allergy.","not on a statin. Patient will be started on Lipitor 10 mg PO daily, to be titrated and managed by their primary physician.","on a statin: Zocor.","on a statin: Lipitor.","on a statin: Pravachol.","on a statin: Crestor.","on a statin: ***."}  The patient is currently {Antiplatelet:19197::"not on  an anti-platelet due to allergy.","not on an anti-platelet due to bleeding risks related to use of an anticoagulant.","not on an anti-platelet. Patient will be started on ASA 81 mg PO daily","on an anti-platelet: ASA.","on an anti-platelet: Plavix.","on an anti-platelet: ASA and Plavix.","on an anti-platelet: ***."}  Thank you for allowing [place] to participate in this patient's care.   Adele Barthel, MD, FACS [facility] [place] Office: [phone] Pager: [phone]

## 2017-10-20 DIAGNOSIS — I7789 Other specified disorders of arteries and arterioles: Secondary | ICD-10-CM | POA: Diagnosis not present

## 2017-10-20 DIAGNOSIS — E7800 Pure hypercholesterolemia, unspecified: Secondary | ICD-10-CM | POA: Diagnosis not present

## 2017-10-20 DIAGNOSIS — Z6823 Body mass index (BMI) 23.0-23.9, adult: Secondary | ICD-10-CM | POA: Diagnosis not present

## 2017-10-20 DIAGNOSIS — Z299 Encounter for prophylactic measures, unspecified: Secondary | ICD-10-CM | POA: Diagnosis not present

## 2017-10-20 DIAGNOSIS — Z79899 Other long term (current) drug therapy: Secondary | ICD-10-CM | POA: Diagnosis not present

## 2017-10-20 DIAGNOSIS — M549 Dorsalgia, unspecified: Secondary | ICD-10-CM | POA: Diagnosis not present

## 2017-10-20 DIAGNOSIS — I739 Peripheral vascular disease, unspecified: Secondary | ICD-10-CM | POA: Diagnosis not present

## 2017-10-20 DIAGNOSIS — I10 Essential (primary) hypertension: Secondary | ICD-10-CM | POA: Diagnosis not present

## 2017-10-20 DIAGNOSIS — R5383 Other fatigue: Secondary | ICD-10-CM | POA: Diagnosis not present

## 2017-10-20 DIAGNOSIS — F17210 Nicotine dependence, cigarettes, uncomplicated: Secondary | ICD-10-CM | POA: Diagnosis not present

## 2017-12-15 DIAGNOSIS — Z6823 Body mass index (BMI) 23.0-23.9, adult: Secondary | ICD-10-CM | POA: Diagnosis not present

## 2017-12-15 DIAGNOSIS — Z299 Encounter for prophylactic measures, unspecified: Secondary | ICD-10-CM | POA: Diagnosis not present

## 2017-12-15 DIAGNOSIS — M549 Dorsalgia, unspecified: Secondary | ICD-10-CM | POA: Diagnosis not present

## 2017-12-15 DIAGNOSIS — G8220 Paraplegia, unspecified: Secondary | ICD-10-CM | POA: Diagnosis not present

## 2017-12-15 DIAGNOSIS — I10 Essential (primary) hypertension: Secondary | ICD-10-CM | POA: Diagnosis not present

## 2017-12-15 DIAGNOSIS — E039 Hypothyroidism, unspecified: Secondary | ICD-10-CM | POA: Diagnosis not present

## 2017-12-15 DIAGNOSIS — I739 Peripheral vascular disease, unspecified: Secondary | ICD-10-CM | POA: Diagnosis not present

## 2017-12-15 DIAGNOSIS — Z89611 Acquired absence of right leg above knee: Secondary | ICD-10-CM | POA: Diagnosis not present

## 2017-12-15 DIAGNOSIS — F17210 Nicotine dependence, cigarettes, uncomplicated: Secondary | ICD-10-CM | POA: Diagnosis not present

## 2018-02-14 DIAGNOSIS — E039 Hypothyroidism, unspecified: Secondary | ICD-10-CM | POA: Diagnosis not present

## 2018-02-14 DIAGNOSIS — Z299 Encounter for prophylactic measures, unspecified: Secondary | ICD-10-CM | POA: Diagnosis not present

## 2018-02-14 DIAGNOSIS — I739 Peripheral vascular disease, unspecified: Secondary | ICD-10-CM | POA: Diagnosis not present

## 2018-02-14 DIAGNOSIS — I10 Essential (primary) hypertension: Secondary | ICD-10-CM | POA: Diagnosis not present

## 2018-02-14 DIAGNOSIS — Z6823 Body mass index (BMI) 23.0-23.9, adult: Secondary | ICD-10-CM | POA: Diagnosis not present

## 2018-02-14 DIAGNOSIS — M545 Low back pain: Secondary | ICD-10-CM | POA: Diagnosis not present

## 2018-02-14 DIAGNOSIS — G8220 Paraplegia, unspecified: Secondary | ICD-10-CM | POA: Diagnosis not present

## 2018-02-27 DIAGNOSIS — M5137 Other intervertebral disc degeneration, lumbosacral region: Secondary | ICD-10-CM | POA: Diagnosis not present

## 2018-02-27 DIAGNOSIS — Z95828 Presence of other vascular implants and grafts: Secondary | ICD-10-CM | POA: Diagnosis not present

## 2018-02-27 DIAGNOSIS — I739 Peripheral vascular disease, unspecified: Secondary | ICD-10-CM | POA: Diagnosis not present

## 2018-02-27 DIAGNOSIS — E039 Hypothyroidism, unspecified: Secondary | ICD-10-CM | POA: Diagnosis not present

## 2018-02-27 DIAGNOSIS — F17210 Nicotine dependence, cigarettes, uncomplicated: Secondary | ICD-10-CM | POA: Diagnosis not present

## 2018-02-27 DIAGNOSIS — Z89611 Acquired absence of right leg above knee: Secondary | ICD-10-CM | POA: Diagnosis not present

## 2018-02-27 DIAGNOSIS — G8220 Paraplegia, unspecified: Secondary | ICD-10-CM | POA: Diagnosis not present

## 2018-02-27 DIAGNOSIS — G8929 Other chronic pain: Secondary | ICD-10-CM | POA: Diagnosis not present

## 2018-02-27 DIAGNOSIS — J449 Chronic obstructive pulmonary disease, unspecified: Secondary | ICD-10-CM | POA: Diagnosis not present

## 2018-02-27 DIAGNOSIS — Z7982 Long term (current) use of aspirin: Secondary | ICD-10-CM | POA: Diagnosis not present

## 2018-02-27 DIAGNOSIS — I10 Essential (primary) hypertension: Secondary | ICD-10-CM | POA: Diagnosis not present

## 2018-03-01 DIAGNOSIS — I10 Essential (primary) hypertension: Secondary | ICD-10-CM | POA: Diagnosis not present

## 2018-03-01 DIAGNOSIS — G8929 Other chronic pain: Secondary | ICD-10-CM | POA: Diagnosis not present

## 2018-03-01 DIAGNOSIS — J449 Chronic obstructive pulmonary disease, unspecified: Secondary | ICD-10-CM | POA: Diagnosis not present

## 2018-03-01 DIAGNOSIS — G8220 Paraplegia, unspecified: Secondary | ICD-10-CM | POA: Diagnosis not present

## 2018-03-01 DIAGNOSIS — M5137 Other intervertebral disc degeneration, lumbosacral region: Secondary | ICD-10-CM | POA: Diagnosis not present

## 2018-03-01 DIAGNOSIS — I739 Peripheral vascular disease, unspecified: Secondary | ICD-10-CM | POA: Diagnosis not present

## 2018-03-03 DIAGNOSIS — G8929 Other chronic pain: Secondary | ICD-10-CM | POA: Diagnosis not present

## 2018-03-03 DIAGNOSIS — G8220 Paraplegia, unspecified: Secondary | ICD-10-CM | POA: Diagnosis not present

## 2018-03-03 DIAGNOSIS — I739 Peripheral vascular disease, unspecified: Secondary | ICD-10-CM | POA: Diagnosis not present

## 2018-03-03 DIAGNOSIS — I10 Essential (primary) hypertension: Secondary | ICD-10-CM | POA: Diagnosis not present

## 2018-03-03 DIAGNOSIS — M5137 Other intervertebral disc degeneration, lumbosacral region: Secondary | ICD-10-CM | POA: Diagnosis not present

## 2018-03-03 DIAGNOSIS — J449 Chronic obstructive pulmonary disease, unspecified: Secondary | ICD-10-CM | POA: Diagnosis not present

## 2018-03-06 DIAGNOSIS — M5137 Other intervertebral disc degeneration, lumbosacral region: Secondary | ICD-10-CM | POA: Diagnosis not present

## 2018-03-06 DIAGNOSIS — G8220 Paraplegia, unspecified: Secondary | ICD-10-CM | POA: Diagnosis not present

## 2018-03-06 DIAGNOSIS — I739 Peripheral vascular disease, unspecified: Secondary | ICD-10-CM | POA: Diagnosis not present

## 2018-03-06 DIAGNOSIS — G8929 Other chronic pain: Secondary | ICD-10-CM | POA: Diagnosis not present

## 2018-03-06 DIAGNOSIS — J449 Chronic obstructive pulmonary disease, unspecified: Secondary | ICD-10-CM | POA: Diagnosis not present

## 2018-03-06 DIAGNOSIS — I10 Essential (primary) hypertension: Secondary | ICD-10-CM | POA: Diagnosis not present

## 2018-03-08 DIAGNOSIS — I10 Essential (primary) hypertension: Secondary | ICD-10-CM | POA: Diagnosis not present

## 2018-03-08 DIAGNOSIS — J449 Chronic obstructive pulmonary disease, unspecified: Secondary | ICD-10-CM | POA: Diagnosis not present

## 2018-03-08 DIAGNOSIS — M5137 Other intervertebral disc degeneration, lumbosacral region: Secondary | ICD-10-CM | POA: Diagnosis not present

## 2018-03-08 DIAGNOSIS — G8929 Other chronic pain: Secondary | ICD-10-CM | POA: Diagnosis not present

## 2018-03-08 DIAGNOSIS — I739 Peripheral vascular disease, unspecified: Secondary | ICD-10-CM | POA: Diagnosis not present

## 2018-03-08 DIAGNOSIS — G8220 Paraplegia, unspecified: Secondary | ICD-10-CM | POA: Diagnosis not present

## 2018-03-10 DIAGNOSIS — G8220 Paraplegia, unspecified: Secondary | ICD-10-CM | POA: Diagnosis not present

## 2018-03-10 DIAGNOSIS — G8929 Other chronic pain: Secondary | ICD-10-CM | POA: Diagnosis not present

## 2018-03-10 DIAGNOSIS — I10 Essential (primary) hypertension: Secondary | ICD-10-CM | POA: Diagnosis not present

## 2018-03-10 DIAGNOSIS — I739 Peripheral vascular disease, unspecified: Secondary | ICD-10-CM | POA: Diagnosis not present

## 2018-03-10 DIAGNOSIS — J449 Chronic obstructive pulmonary disease, unspecified: Secondary | ICD-10-CM | POA: Diagnosis not present

## 2018-03-10 DIAGNOSIS — M5137 Other intervertebral disc degeneration, lumbosacral region: Secondary | ICD-10-CM | POA: Diagnosis not present

## 2018-03-14 DIAGNOSIS — J449 Chronic obstructive pulmonary disease, unspecified: Secondary | ICD-10-CM | POA: Diagnosis not present

## 2018-03-14 DIAGNOSIS — I739 Peripheral vascular disease, unspecified: Secondary | ICD-10-CM | POA: Diagnosis not present

## 2018-03-14 DIAGNOSIS — I10 Essential (primary) hypertension: Secondary | ICD-10-CM | POA: Diagnosis not present

## 2018-03-14 DIAGNOSIS — G8220 Paraplegia, unspecified: Secondary | ICD-10-CM | POA: Diagnosis not present

## 2018-03-14 DIAGNOSIS — M5137 Other intervertebral disc degeneration, lumbosacral region: Secondary | ICD-10-CM | POA: Diagnosis not present

## 2018-03-14 DIAGNOSIS — G8929 Other chronic pain: Secondary | ICD-10-CM | POA: Diagnosis not present

## 2018-04-17 DIAGNOSIS — I10 Essential (primary) hypertension: Secondary | ICD-10-CM | POA: Diagnosis not present

## 2018-04-17 DIAGNOSIS — G8220 Paraplegia, unspecified: Secondary | ICD-10-CM | POA: Diagnosis not present

## 2018-04-17 DIAGNOSIS — I739 Peripheral vascular disease, unspecified: Secondary | ICD-10-CM | POA: Diagnosis not present

## 2018-04-17 DIAGNOSIS — Z299 Encounter for prophylactic measures, unspecified: Secondary | ICD-10-CM | POA: Diagnosis not present

## 2018-04-17 DIAGNOSIS — E039 Hypothyroidism, unspecified: Secondary | ICD-10-CM | POA: Diagnosis not present

## 2018-04-17 DIAGNOSIS — Z6825 Body mass index (BMI) 25.0-25.9, adult: Secondary | ICD-10-CM | POA: Diagnosis not present

## 2018-06-16 DIAGNOSIS — G546 Phantom limb syndrome with pain: Secondary | ICD-10-CM | POA: Diagnosis not present

## 2018-06-16 DIAGNOSIS — F419 Anxiety disorder, unspecified: Secondary | ICD-10-CM | POA: Diagnosis not present

## 2018-06-16 DIAGNOSIS — Z6825 Body mass index (BMI) 25.0-25.9, adult: Secondary | ICD-10-CM | POA: Diagnosis not present

## 2018-06-16 DIAGNOSIS — I10 Essential (primary) hypertension: Secondary | ICD-10-CM | POA: Diagnosis not present

## 2018-06-16 DIAGNOSIS — I739 Peripheral vascular disease, unspecified: Secondary | ICD-10-CM | POA: Diagnosis not present

## 2018-06-16 DIAGNOSIS — Z299 Encounter for prophylactic measures, unspecified: Secondary | ICD-10-CM | POA: Diagnosis not present

## 2018-08-11 DIAGNOSIS — I739 Peripheral vascular disease, unspecified: Secondary | ICD-10-CM | POA: Diagnosis not present

## 2018-08-11 DIAGNOSIS — M549 Dorsalgia, unspecified: Secondary | ICD-10-CM | POA: Diagnosis not present

## 2018-08-11 DIAGNOSIS — Z79899 Other long term (current) drug therapy: Secondary | ICD-10-CM | POA: Diagnosis not present

## 2018-08-11 DIAGNOSIS — E039 Hypothyroidism, unspecified: Secondary | ICD-10-CM | POA: Diagnosis not present

## 2018-08-11 DIAGNOSIS — I10 Essential (primary) hypertension: Secondary | ICD-10-CM | POA: Diagnosis not present

## 2018-08-11 DIAGNOSIS — G8220 Paraplegia, unspecified: Secondary | ICD-10-CM | POA: Diagnosis not present

## 2018-08-11 DIAGNOSIS — Z6824 Body mass index (BMI) 24.0-24.9, adult: Secondary | ICD-10-CM | POA: Diagnosis not present

## 2018-08-11 DIAGNOSIS — Z299 Encounter for prophylactic measures, unspecified: Secondary | ICD-10-CM | POA: Diagnosis not present

## 2018-10-06 DIAGNOSIS — I10 Essential (primary) hypertension: Secondary | ICD-10-CM | POA: Diagnosis not present

## 2018-10-06 DIAGNOSIS — G8220 Paraplegia, unspecified: Secondary | ICD-10-CM | POA: Diagnosis not present

## 2018-10-06 DIAGNOSIS — I739 Peripheral vascular disease, unspecified: Secondary | ICD-10-CM | POA: Diagnosis not present

## 2018-10-06 DIAGNOSIS — F17210 Nicotine dependence, cigarettes, uncomplicated: Secondary | ICD-10-CM | POA: Diagnosis not present

## 2018-10-06 DIAGNOSIS — Z6824 Body mass index (BMI) 24.0-24.9, adult: Secondary | ICD-10-CM | POA: Diagnosis not present

## 2018-10-06 DIAGNOSIS — M545 Low back pain: Secondary | ICD-10-CM | POA: Diagnosis not present

## 2018-10-06 DIAGNOSIS — Z299 Encounter for prophylactic measures, unspecified: Secondary | ICD-10-CM | POA: Diagnosis not present

## 2018-10-27 DIAGNOSIS — I10 Essential (primary) hypertension: Secondary | ICD-10-CM | POA: Diagnosis not present

## 2018-10-27 DIAGNOSIS — F17210 Nicotine dependence, cigarettes, uncomplicated: Secondary | ICD-10-CM | POA: Diagnosis not present

## 2018-10-27 DIAGNOSIS — Z6824 Body mass index (BMI) 24.0-24.9, adult: Secondary | ICD-10-CM | POA: Diagnosis not present

## 2018-10-27 DIAGNOSIS — Z299 Encounter for prophylactic measures, unspecified: Secondary | ICD-10-CM | POA: Diagnosis not present

## 2018-10-27 DIAGNOSIS — F419 Anxiety disorder, unspecified: Secondary | ICD-10-CM | POA: Diagnosis not present

## 2018-11-06 DIAGNOSIS — M545 Low back pain: Secondary | ICD-10-CM | POA: Diagnosis not present

## 2018-11-06 DIAGNOSIS — Z79899 Other long term (current) drug therapy: Secondary | ICD-10-CM | POA: Diagnosis not present

## 2018-11-06 DIAGNOSIS — Z6824 Body mass index (BMI) 24.0-24.9, adult: Secondary | ICD-10-CM | POA: Diagnosis not present

## 2018-11-06 DIAGNOSIS — E039 Hypothyroidism, unspecified: Secondary | ICD-10-CM | POA: Diagnosis not present

## 2018-11-06 DIAGNOSIS — F17210 Nicotine dependence, cigarettes, uncomplicated: Secondary | ICD-10-CM | POA: Diagnosis not present

## 2018-11-06 DIAGNOSIS — I10 Essential (primary) hypertension: Secondary | ICD-10-CM | POA: Diagnosis not present

## 2018-11-06 DIAGNOSIS — Z299 Encounter for prophylactic measures, unspecified: Secondary | ICD-10-CM | POA: Diagnosis not present

## 2018-11-06 DIAGNOSIS — I739 Peripheral vascular disease, unspecified: Secondary | ICD-10-CM | POA: Diagnosis not present

## 2018-11-08 DIAGNOSIS — G8929 Other chronic pain: Secondary | ICD-10-CM | POA: Diagnosis not present

## 2018-11-08 DIAGNOSIS — G8220 Paraplegia, unspecified: Secondary | ICD-10-CM | POA: Diagnosis not present

## 2018-11-08 DIAGNOSIS — I739 Peripheral vascular disease, unspecified: Secondary | ICD-10-CM | POA: Diagnosis not present

## 2018-11-08 DIAGNOSIS — Z79891 Long term (current) use of opiate analgesic: Secondary | ICD-10-CM | POA: Diagnosis not present

## 2018-11-08 DIAGNOSIS — Z89611 Acquired absence of right leg above knee: Secondary | ICD-10-CM | POA: Diagnosis not present

## 2018-11-08 DIAGNOSIS — I10 Essential (primary) hypertension: Secondary | ICD-10-CM | POA: Diagnosis not present

## 2018-11-08 DIAGNOSIS — M545 Low back pain: Secondary | ICD-10-CM | POA: Diagnosis not present

## 2018-11-08 DIAGNOSIS — Z7982 Long term (current) use of aspirin: Secondary | ICD-10-CM | POA: Diagnosis not present

## 2018-11-08 DIAGNOSIS — F17210 Nicotine dependence, cigarettes, uncomplicated: Secondary | ICD-10-CM | POA: Diagnosis not present

## 2018-11-08 DIAGNOSIS — Z9181 History of falling: Secondary | ICD-10-CM | POA: Diagnosis not present

## 2018-11-08 DIAGNOSIS — Z95828 Presence of other vascular implants and grafts: Secondary | ICD-10-CM | POA: Diagnosis not present

## 2018-11-08 DIAGNOSIS — R270 Ataxia, unspecified: Secondary | ICD-10-CM | POA: Diagnosis not present

## 2018-11-09 DIAGNOSIS — R270 Ataxia, unspecified: Secondary | ICD-10-CM | POA: Diagnosis not present

## 2018-11-09 DIAGNOSIS — M545 Low back pain: Secondary | ICD-10-CM | POA: Diagnosis not present

## 2018-11-09 DIAGNOSIS — G8220 Paraplegia, unspecified: Secondary | ICD-10-CM | POA: Diagnosis not present

## 2018-11-09 DIAGNOSIS — F17210 Nicotine dependence, cigarettes, uncomplicated: Secondary | ICD-10-CM | POA: Diagnosis not present

## 2018-11-09 DIAGNOSIS — G8929 Other chronic pain: Secondary | ICD-10-CM | POA: Diagnosis not present

## 2018-11-09 DIAGNOSIS — I10 Essential (primary) hypertension: Secondary | ICD-10-CM | POA: Diagnosis not present

## 2018-11-14 DIAGNOSIS — F17210 Nicotine dependence, cigarettes, uncomplicated: Secondary | ICD-10-CM | POA: Diagnosis not present

## 2018-11-14 DIAGNOSIS — G8929 Other chronic pain: Secondary | ICD-10-CM | POA: Diagnosis not present

## 2018-11-14 DIAGNOSIS — M545 Low back pain: Secondary | ICD-10-CM | POA: Diagnosis not present

## 2018-11-14 DIAGNOSIS — R270 Ataxia, unspecified: Secondary | ICD-10-CM | POA: Diagnosis not present

## 2018-11-14 DIAGNOSIS — G8220 Paraplegia, unspecified: Secondary | ICD-10-CM | POA: Diagnosis not present

## 2018-11-14 DIAGNOSIS — I10 Essential (primary) hypertension: Secondary | ICD-10-CM | POA: Diagnosis not present

## 2018-11-17 DIAGNOSIS — R270 Ataxia, unspecified: Secondary | ICD-10-CM | POA: Diagnosis not present

## 2018-11-17 DIAGNOSIS — G8220 Paraplegia, unspecified: Secondary | ICD-10-CM | POA: Diagnosis not present

## 2018-11-17 DIAGNOSIS — G8929 Other chronic pain: Secondary | ICD-10-CM | POA: Diagnosis not present

## 2018-11-17 DIAGNOSIS — F17210 Nicotine dependence, cigarettes, uncomplicated: Secondary | ICD-10-CM | POA: Diagnosis not present

## 2018-11-17 DIAGNOSIS — M545 Low back pain: Secondary | ICD-10-CM | POA: Diagnosis not present

## 2018-11-17 DIAGNOSIS — I10 Essential (primary) hypertension: Secondary | ICD-10-CM | POA: Diagnosis not present

## 2018-11-21 DIAGNOSIS — G8929 Other chronic pain: Secondary | ICD-10-CM | POA: Diagnosis not present

## 2018-11-21 DIAGNOSIS — G8220 Paraplegia, unspecified: Secondary | ICD-10-CM | POA: Diagnosis not present

## 2018-11-21 DIAGNOSIS — R270 Ataxia, unspecified: Secondary | ICD-10-CM | POA: Diagnosis not present

## 2018-11-21 DIAGNOSIS — I10 Essential (primary) hypertension: Secondary | ICD-10-CM | POA: Diagnosis not present

## 2018-11-21 DIAGNOSIS — F17210 Nicotine dependence, cigarettes, uncomplicated: Secondary | ICD-10-CM | POA: Diagnosis not present

## 2018-11-21 DIAGNOSIS — M545 Low back pain: Secondary | ICD-10-CM | POA: Diagnosis not present

## 2018-11-23 DIAGNOSIS — R270 Ataxia, unspecified: Secondary | ICD-10-CM | POA: Diagnosis not present

## 2018-11-23 DIAGNOSIS — G8220 Paraplegia, unspecified: Secondary | ICD-10-CM | POA: Diagnosis not present

## 2018-11-23 DIAGNOSIS — F17210 Nicotine dependence, cigarettes, uncomplicated: Secondary | ICD-10-CM | POA: Diagnosis not present

## 2018-11-23 DIAGNOSIS — I10 Essential (primary) hypertension: Secondary | ICD-10-CM | POA: Diagnosis not present

## 2018-11-23 DIAGNOSIS — M545 Low back pain: Secondary | ICD-10-CM | POA: Diagnosis not present

## 2018-11-23 DIAGNOSIS — G8929 Other chronic pain: Secondary | ICD-10-CM | POA: Diagnosis not present

## 2018-11-26 DIAGNOSIS — G8220 Paraplegia, unspecified: Secondary | ICD-10-CM | POA: Diagnosis not present

## 2018-11-29 DIAGNOSIS — M545 Low back pain: Secondary | ICD-10-CM | POA: Diagnosis not present

## 2018-11-29 DIAGNOSIS — G8929 Other chronic pain: Secondary | ICD-10-CM | POA: Diagnosis not present

## 2018-11-29 DIAGNOSIS — G8220 Paraplegia, unspecified: Secondary | ICD-10-CM | POA: Diagnosis not present

## 2018-11-29 DIAGNOSIS — F17210 Nicotine dependence, cigarettes, uncomplicated: Secondary | ICD-10-CM | POA: Diagnosis not present

## 2018-11-29 DIAGNOSIS — R270 Ataxia, unspecified: Secondary | ICD-10-CM | POA: Diagnosis not present

## 2018-11-29 DIAGNOSIS — I10 Essential (primary) hypertension: Secondary | ICD-10-CM | POA: Diagnosis not present

## 2018-12-01 DIAGNOSIS — F17210 Nicotine dependence, cigarettes, uncomplicated: Secondary | ICD-10-CM | POA: Diagnosis not present

## 2018-12-01 DIAGNOSIS — M545 Low back pain: Secondary | ICD-10-CM | POA: Diagnosis not present

## 2018-12-01 DIAGNOSIS — G8220 Paraplegia, unspecified: Secondary | ICD-10-CM | POA: Diagnosis not present

## 2018-12-01 DIAGNOSIS — R270 Ataxia, unspecified: Secondary | ICD-10-CM | POA: Diagnosis not present

## 2018-12-01 DIAGNOSIS — G8929 Other chronic pain: Secondary | ICD-10-CM | POA: Diagnosis not present

## 2018-12-01 DIAGNOSIS — I10 Essential (primary) hypertension: Secondary | ICD-10-CM | POA: Diagnosis not present

## 2018-12-05 DIAGNOSIS — R270 Ataxia, unspecified: Secondary | ICD-10-CM | POA: Diagnosis not present

## 2018-12-05 DIAGNOSIS — F17210 Nicotine dependence, cigarettes, uncomplicated: Secondary | ICD-10-CM | POA: Diagnosis not present

## 2018-12-05 DIAGNOSIS — G8220 Paraplegia, unspecified: Secondary | ICD-10-CM | POA: Diagnosis not present

## 2018-12-05 DIAGNOSIS — G8929 Other chronic pain: Secondary | ICD-10-CM | POA: Diagnosis not present

## 2018-12-05 DIAGNOSIS — M545 Low back pain: Secondary | ICD-10-CM | POA: Diagnosis not present

## 2018-12-05 DIAGNOSIS — I10 Essential (primary) hypertension: Secondary | ICD-10-CM | POA: Diagnosis not present

## 2018-12-07 DIAGNOSIS — R270 Ataxia, unspecified: Secondary | ICD-10-CM | POA: Diagnosis not present

## 2018-12-07 DIAGNOSIS — G8220 Paraplegia, unspecified: Secondary | ICD-10-CM | POA: Diagnosis not present

## 2018-12-07 DIAGNOSIS — G8929 Other chronic pain: Secondary | ICD-10-CM | POA: Diagnosis not present

## 2018-12-07 DIAGNOSIS — F17210 Nicotine dependence, cigarettes, uncomplicated: Secondary | ICD-10-CM | POA: Diagnosis not present

## 2018-12-07 DIAGNOSIS — M545 Low back pain: Secondary | ICD-10-CM | POA: Diagnosis not present

## 2018-12-07 DIAGNOSIS — I10 Essential (primary) hypertension: Secondary | ICD-10-CM | POA: Diagnosis not present

## 2018-12-08 DIAGNOSIS — M545 Low back pain: Secondary | ICD-10-CM | POA: Diagnosis not present

## 2018-12-08 DIAGNOSIS — R270 Ataxia, unspecified: Secondary | ICD-10-CM | POA: Diagnosis not present

## 2018-12-08 DIAGNOSIS — I7789 Other specified disorders of arteries and arterioles: Secondary | ICD-10-CM | POA: Diagnosis not present

## 2018-12-08 DIAGNOSIS — G8929 Other chronic pain: Secondary | ICD-10-CM | POA: Diagnosis not present

## 2018-12-08 DIAGNOSIS — Z95828 Presence of other vascular implants and grafts: Secondary | ICD-10-CM | POA: Diagnosis not present

## 2018-12-08 DIAGNOSIS — Z6824 Body mass index (BMI) 24.0-24.9, adult: Secondary | ICD-10-CM | POA: Diagnosis not present

## 2018-12-08 DIAGNOSIS — Z79891 Long term (current) use of opiate analgesic: Secondary | ICD-10-CM | POA: Diagnosis not present

## 2018-12-08 DIAGNOSIS — Z299 Encounter for prophylactic measures, unspecified: Secondary | ICD-10-CM | POA: Diagnosis not present

## 2018-12-08 DIAGNOSIS — F17210 Nicotine dependence, cigarettes, uncomplicated: Secondary | ICD-10-CM | POA: Diagnosis not present

## 2018-12-08 DIAGNOSIS — I739 Peripheral vascular disease, unspecified: Secondary | ICD-10-CM | POA: Diagnosis not present

## 2018-12-08 DIAGNOSIS — Z9181 History of falling: Secondary | ICD-10-CM | POA: Diagnosis not present

## 2018-12-08 DIAGNOSIS — I10 Essential (primary) hypertension: Secondary | ICD-10-CM | POA: Diagnosis not present

## 2018-12-08 DIAGNOSIS — Z7982 Long term (current) use of aspirin: Secondary | ICD-10-CM | POA: Diagnosis not present

## 2018-12-08 DIAGNOSIS — G8220 Paraplegia, unspecified: Secondary | ICD-10-CM | POA: Diagnosis not present

## 2018-12-08 DIAGNOSIS — Z89611 Acquired absence of right leg above knee: Secondary | ICD-10-CM | POA: Diagnosis not present

## 2018-12-12 DIAGNOSIS — M545 Low back pain: Secondary | ICD-10-CM | POA: Diagnosis not present

## 2018-12-12 DIAGNOSIS — G8220 Paraplegia, unspecified: Secondary | ICD-10-CM | POA: Diagnosis not present

## 2018-12-12 DIAGNOSIS — R270 Ataxia, unspecified: Secondary | ICD-10-CM | POA: Diagnosis not present

## 2018-12-12 DIAGNOSIS — I10 Essential (primary) hypertension: Secondary | ICD-10-CM | POA: Diagnosis not present

## 2018-12-12 DIAGNOSIS — G8929 Other chronic pain: Secondary | ICD-10-CM | POA: Diagnosis not present

## 2018-12-12 DIAGNOSIS — F17210 Nicotine dependence, cigarettes, uncomplicated: Secondary | ICD-10-CM | POA: Diagnosis not present

## 2018-12-15 DIAGNOSIS — F17210 Nicotine dependence, cigarettes, uncomplicated: Secondary | ICD-10-CM | POA: Diagnosis not present

## 2018-12-15 DIAGNOSIS — G8929 Other chronic pain: Secondary | ICD-10-CM | POA: Diagnosis not present

## 2018-12-15 DIAGNOSIS — G8220 Paraplegia, unspecified: Secondary | ICD-10-CM | POA: Diagnosis not present

## 2018-12-15 DIAGNOSIS — R270 Ataxia, unspecified: Secondary | ICD-10-CM | POA: Diagnosis not present

## 2018-12-15 DIAGNOSIS — I10 Essential (primary) hypertension: Secondary | ICD-10-CM | POA: Diagnosis not present

## 2018-12-15 DIAGNOSIS — M545 Low back pain: Secondary | ICD-10-CM | POA: Diagnosis not present

## 2018-12-20 DIAGNOSIS — R270 Ataxia, unspecified: Secondary | ICD-10-CM | POA: Diagnosis not present

## 2018-12-20 DIAGNOSIS — I10 Essential (primary) hypertension: Secondary | ICD-10-CM | POA: Diagnosis not present

## 2018-12-20 DIAGNOSIS — F17210 Nicotine dependence, cigarettes, uncomplicated: Secondary | ICD-10-CM | POA: Diagnosis not present

## 2018-12-20 DIAGNOSIS — M545 Low back pain: Secondary | ICD-10-CM | POA: Diagnosis not present

## 2018-12-20 DIAGNOSIS — G8220 Paraplegia, unspecified: Secondary | ICD-10-CM | POA: Diagnosis not present

## 2018-12-20 DIAGNOSIS — G8929 Other chronic pain: Secondary | ICD-10-CM | POA: Diagnosis not present

## 2018-12-22 DIAGNOSIS — F17210 Nicotine dependence, cigarettes, uncomplicated: Secondary | ICD-10-CM | POA: Diagnosis not present

## 2018-12-22 DIAGNOSIS — M545 Low back pain: Secondary | ICD-10-CM | POA: Diagnosis not present

## 2018-12-22 DIAGNOSIS — I10 Essential (primary) hypertension: Secondary | ICD-10-CM | POA: Diagnosis not present

## 2018-12-22 DIAGNOSIS — G8929 Other chronic pain: Secondary | ICD-10-CM | POA: Diagnosis not present

## 2018-12-22 DIAGNOSIS — R270 Ataxia, unspecified: Secondary | ICD-10-CM | POA: Diagnosis not present

## 2018-12-22 DIAGNOSIS — G8220 Paraplegia, unspecified: Secondary | ICD-10-CM | POA: Diagnosis not present

## 2018-12-26 DIAGNOSIS — G8220 Paraplegia, unspecified: Secondary | ICD-10-CM | POA: Diagnosis not present

## 2018-12-26 DIAGNOSIS — R270 Ataxia, unspecified: Secondary | ICD-10-CM | POA: Diagnosis not present

## 2018-12-26 DIAGNOSIS — G8929 Other chronic pain: Secondary | ICD-10-CM | POA: Diagnosis not present

## 2018-12-26 DIAGNOSIS — F17210 Nicotine dependence, cigarettes, uncomplicated: Secondary | ICD-10-CM | POA: Diagnosis not present

## 2018-12-26 DIAGNOSIS — M545 Low back pain: Secondary | ICD-10-CM | POA: Diagnosis not present

## 2018-12-26 DIAGNOSIS — I10 Essential (primary) hypertension: Secondary | ICD-10-CM | POA: Diagnosis not present

## 2018-12-28 DIAGNOSIS — I10 Essential (primary) hypertension: Secondary | ICD-10-CM | POA: Diagnosis not present

## 2018-12-28 DIAGNOSIS — F17210 Nicotine dependence, cigarettes, uncomplicated: Secondary | ICD-10-CM | POA: Diagnosis not present

## 2018-12-28 DIAGNOSIS — M545 Low back pain: Secondary | ICD-10-CM | POA: Diagnosis not present

## 2018-12-28 DIAGNOSIS — G8220 Paraplegia, unspecified: Secondary | ICD-10-CM | POA: Diagnosis not present

## 2018-12-28 DIAGNOSIS — G8929 Other chronic pain: Secondary | ICD-10-CM | POA: Diagnosis not present

## 2018-12-28 DIAGNOSIS — R270 Ataxia, unspecified: Secondary | ICD-10-CM | POA: Diagnosis not present

## 2019-01-03 DIAGNOSIS — G8929 Other chronic pain: Secondary | ICD-10-CM | POA: Diagnosis not present

## 2019-01-03 DIAGNOSIS — F17210 Nicotine dependence, cigarettes, uncomplicated: Secondary | ICD-10-CM | POA: Diagnosis not present

## 2019-01-03 DIAGNOSIS — I10 Essential (primary) hypertension: Secondary | ICD-10-CM | POA: Diagnosis not present

## 2019-01-03 DIAGNOSIS — M545 Low back pain: Secondary | ICD-10-CM | POA: Diagnosis not present

## 2019-01-03 DIAGNOSIS — G8220 Paraplegia, unspecified: Secondary | ICD-10-CM | POA: Diagnosis not present

## 2019-01-03 DIAGNOSIS — R270 Ataxia, unspecified: Secondary | ICD-10-CM | POA: Diagnosis not present

## 2019-01-05 DIAGNOSIS — I739 Peripheral vascular disease, unspecified: Secondary | ICD-10-CM | POA: Diagnosis not present

## 2019-01-05 DIAGNOSIS — M545 Low back pain: Secondary | ICD-10-CM | POA: Diagnosis not present

## 2019-01-05 DIAGNOSIS — Z299 Encounter for prophylactic measures, unspecified: Secondary | ICD-10-CM | POA: Diagnosis not present

## 2019-01-05 DIAGNOSIS — G8220 Paraplegia, unspecified: Secondary | ICD-10-CM | POA: Diagnosis not present

## 2019-01-05 DIAGNOSIS — I7789 Other specified disorders of arteries and arterioles: Secondary | ICD-10-CM | POA: Diagnosis not present

## 2019-01-05 DIAGNOSIS — Z6824 Body mass index (BMI) 24.0-24.9, adult: Secondary | ICD-10-CM | POA: Diagnosis not present

## 2019-01-16 DIAGNOSIS — Z299 Encounter for prophylactic measures, unspecified: Secondary | ICD-10-CM | POA: Diagnosis not present

## 2019-01-16 DIAGNOSIS — N390 Urinary tract infection, site not specified: Secondary | ICD-10-CM | POA: Diagnosis not present

## 2019-01-16 DIAGNOSIS — Z6824 Body mass index (BMI) 24.0-24.9, adult: Secondary | ICD-10-CM | POA: Diagnosis not present

## 2019-01-16 DIAGNOSIS — I10 Essential (primary) hypertension: Secondary | ICD-10-CM | POA: Diagnosis not present

## 2019-02-02 DIAGNOSIS — M549 Dorsalgia, unspecified: Secondary | ICD-10-CM | POA: Diagnosis not present

## 2019-02-02 DIAGNOSIS — Z299 Encounter for prophylactic measures, unspecified: Secondary | ICD-10-CM | POA: Diagnosis not present

## 2019-02-02 DIAGNOSIS — I10 Essential (primary) hypertension: Secondary | ICD-10-CM | POA: Diagnosis not present

## 2019-03-05 DIAGNOSIS — F410 Panic disorder [episodic paroxysmal anxiety] without agoraphobia: Secondary | ICD-10-CM | POA: Diagnosis not present

## 2019-03-05 DIAGNOSIS — I739 Peripheral vascular disease, unspecified: Secondary | ICD-10-CM | POA: Diagnosis not present

## 2019-03-05 DIAGNOSIS — M549 Dorsalgia, unspecified: Secondary | ICD-10-CM | POA: Diagnosis not present

## 2019-03-05 DIAGNOSIS — Z299 Encounter for prophylactic measures, unspecified: Secondary | ICD-10-CM | POA: Diagnosis not present

## 2019-03-05 DIAGNOSIS — F17210 Nicotine dependence, cigarettes, uncomplicated: Secondary | ICD-10-CM | POA: Diagnosis not present

## 2019-03-05 DIAGNOSIS — Z6824 Body mass index (BMI) 24.0-24.9, adult: Secondary | ICD-10-CM | POA: Diagnosis not present

## 2019-03-20 DIAGNOSIS — Z713 Dietary counseling and surveillance: Secondary | ICD-10-CM | POA: Diagnosis not present

## 2019-03-20 DIAGNOSIS — J019 Acute sinusitis, unspecified: Secondary | ICD-10-CM | POA: Diagnosis not present

## 2019-03-20 DIAGNOSIS — Z299 Encounter for prophylactic measures, unspecified: Secondary | ICD-10-CM | POA: Diagnosis not present

## 2019-03-20 DIAGNOSIS — I10 Essential (primary) hypertension: Secondary | ICD-10-CM | POA: Diagnosis not present

## 2019-03-30 DIAGNOSIS — Z79899 Other long term (current) drug therapy: Secondary | ICD-10-CM | POA: Diagnosis not present

## 2019-03-30 DIAGNOSIS — I739 Peripheral vascular disease, unspecified: Secondary | ICD-10-CM | POA: Diagnosis not present

## 2019-03-30 DIAGNOSIS — F419 Anxiety disorder, unspecified: Secondary | ICD-10-CM | POA: Diagnosis not present

## 2019-03-30 DIAGNOSIS — M79606 Pain in leg, unspecified: Secondary | ICD-10-CM | POA: Diagnosis not present

## 2019-03-30 DIAGNOSIS — I7789 Other specified disorders of arteries and arterioles: Secondary | ICD-10-CM | POA: Diagnosis not present

## 2019-03-30 DIAGNOSIS — Z299 Encounter for prophylactic measures, unspecified: Secondary | ICD-10-CM | POA: Diagnosis not present

## 2019-05-03 DIAGNOSIS — E039 Hypothyroidism, unspecified: Secondary | ICD-10-CM | POA: Diagnosis not present

## 2019-05-03 DIAGNOSIS — Z299 Encounter for prophylactic measures, unspecified: Secondary | ICD-10-CM | POA: Diagnosis not present

## 2019-05-03 DIAGNOSIS — Z6824 Body mass index (BMI) 24.0-24.9, adult: Secondary | ICD-10-CM | POA: Diagnosis not present

## 2019-05-03 DIAGNOSIS — M549 Dorsalgia, unspecified: Secondary | ICD-10-CM | POA: Diagnosis not present

## 2019-05-03 DIAGNOSIS — F17210 Nicotine dependence, cigarettes, uncomplicated: Secondary | ICD-10-CM | POA: Diagnosis not present

## 2019-05-03 DIAGNOSIS — I739 Peripheral vascular disease, unspecified: Secondary | ICD-10-CM | POA: Diagnosis not present

## 2019-05-03 DIAGNOSIS — I10 Essential (primary) hypertension: Secondary | ICD-10-CM | POA: Diagnosis not present

## 2019-05-03 DIAGNOSIS — Z79899 Other long term (current) drug therapy: Secondary | ICD-10-CM | POA: Diagnosis not present

## 2019-05-03 DIAGNOSIS — G8220 Paraplegia, unspecified: Secondary | ICD-10-CM | POA: Diagnosis not present

## 2019-05-11 DIAGNOSIS — G8220 Paraplegia, unspecified: Secondary | ICD-10-CM | POA: Diagnosis not present

## 2019-05-11 DIAGNOSIS — I739 Peripheral vascular disease, unspecified: Secondary | ICD-10-CM | POA: Diagnosis not present

## 2019-05-11 DIAGNOSIS — Z89611 Acquired absence of right leg above knee: Secondary | ICD-10-CM | POA: Diagnosis not present

## 2019-05-11 DIAGNOSIS — M5136 Other intervertebral disc degeneration, lumbar region: Secondary | ICD-10-CM | POA: Diagnosis not present

## 2019-05-21 DIAGNOSIS — I10 Essential (primary) hypertension: Secondary | ICD-10-CM | POA: Diagnosis not present

## 2019-05-21 DIAGNOSIS — N390 Urinary tract infection, site not specified: Secondary | ICD-10-CM | POA: Diagnosis not present

## 2019-05-21 DIAGNOSIS — Z89511 Acquired absence of right leg below knee: Secondary | ICD-10-CM | POA: Diagnosis not present

## 2019-05-21 DIAGNOSIS — I7789 Other specified disorders of arteries and arterioles: Secondary | ICD-10-CM | POA: Diagnosis not present

## 2019-05-21 DIAGNOSIS — Z6824 Body mass index (BMI) 24.0-24.9, adult: Secondary | ICD-10-CM | POA: Diagnosis not present

## 2019-05-21 DIAGNOSIS — Z299 Encounter for prophylactic measures, unspecified: Secondary | ICD-10-CM | POA: Diagnosis not present

## 2019-05-23 DIAGNOSIS — Z299 Encounter for prophylactic measures, unspecified: Secondary | ICD-10-CM | POA: Diagnosis not present

## 2019-05-23 DIAGNOSIS — S81802A Unspecified open wound, left lower leg, initial encounter: Secondary | ICD-10-CM | POA: Diagnosis not present

## 2019-05-23 DIAGNOSIS — Z713 Dietary counseling and surveillance: Secondary | ICD-10-CM | POA: Diagnosis not present

## 2019-05-23 DIAGNOSIS — Z6824 Body mass index (BMI) 24.0-24.9, adult: Secondary | ICD-10-CM | POA: Diagnosis not present

## 2019-05-23 DIAGNOSIS — I10 Essential (primary) hypertension: Secondary | ICD-10-CM | POA: Diagnosis not present

## 2019-06-05 DIAGNOSIS — M545 Low back pain: Secondary | ICD-10-CM | POA: Diagnosis not present

## 2019-06-05 DIAGNOSIS — Z79899 Other long term (current) drug therapy: Secondary | ICD-10-CM | POA: Diagnosis not present

## 2019-06-05 DIAGNOSIS — Z299 Encounter for prophylactic measures, unspecified: Secondary | ICD-10-CM | POA: Diagnosis not present

## 2019-06-05 DIAGNOSIS — I10 Essential (primary) hypertension: Secondary | ICD-10-CM | POA: Diagnosis not present

## 2019-06-05 DIAGNOSIS — Z6824 Body mass index (BMI) 24.0-24.9, adult: Secondary | ICD-10-CM | POA: Diagnosis not present

## 2019-06-05 DIAGNOSIS — F419 Anxiety disorder, unspecified: Secondary | ICD-10-CM | POA: Diagnosis not present

## 2019-06-10 DIAGNOSIS — G8220 Paraplegia, unspecified: Secondary | ICD-10-CM | POA: Diagnosis not present

## 2019-06-10 DIAGNOSIS — M5136 Other intervertebral disc degeneration, lumbar region: Secondary | ICD-10-CM | POA: Diagnosis not present

## 2019-06-10 DIAGNOSIS — I739 Peripheral vascular disease, unspecified: Secondary | ICD-10-CM | POA: Diagnosis not present

## 2019-06-10 DIAGNOSIS — Z89611 Acquired absence of right leg above knee: Secondary | ICD-10-CM | POA: Diagnosis not present

## 2019-06-11 DIAGNOSIS — G8220 Paraplegia, unspecified: Secondary | ICD-10-CM | POA: Diagnosis not present

## 2019-06-11 DIAGNOSIS — Z89611 Acquired absence of right leg above knee: Secondary | ICD-10-CM | POA: Diagnosis not present

## 2019-06-11 DIAGNOSIS — I739 Peripheral vascular disease, unspecified: Secondary | ICD-10-CM | POA: Diagnosis not present

## 2019-06-11 DIAGNOSIS — M5136 Other intervertebral disc degeneration, lumbar region: Secondary | ICD-10-CM | POA: Diagnosis not present

## 2019-06-22 DIAGNOSIS — L0232 Furuncle of buttock: Secondary | ICD-10-CM | POA: Diagnosis not present

## 2019-06-22 DIAGNOSIS — G8220 Paraplegia, unspecified: Secondary | ICD-10-CM | POA: Diagnosis not present

## 2019-06-22 DIAGNOSIS — Z299 Encounter for prophylactic measures, unspecified: Secondary | ICD-10-CM | POA: Diagnosis not present

## 2019-06-22 DIAGNOSIS — Z6824 Body mass index (BMI) 24.0-24.9, adult: Secondary | ICD-10-CM | POA: Diagnosis not present

## 2019-06-22 DIAGNOSIS — I10 Essential (primary) hypertension: Secondary | ICD-10-CM | POA: Diagnosis not present

## 2019-06-22 DIAGNOSIS — L98499 Non-pressure chronic ulcer of skin of other sites with unspecified severity: Secondary | ICD-10-CM | POA: Diagnosis not present

## 2019-07-31 DIAGNOSIS — R5381 Other malaise: Secondary | ICD-10-CM | POA: Diagnosis not present

## 2019-07-31 DIAGNOSIS — R140 Abdominal distension (gaseous): Secondary | ICD-10-CM | POA: Diagnosis not present

## 2019-07-31 DIAGNOSIS — I10 Essential (primary) hypertension: Secondary | ICD-10-CM | POA: Diagnosis not present

## 2019-07-31 DIAGNOSIS — K5900 Constipation, unspecified: Secondary | ICD-10-CM | POA: Diagnosis not present

## 2019-07-31 DIAGNOSIS — R000 Tachycardia, unspecified: Secondary | ICD-10-CM | POA: Diagnosis not present

## 2019-08-01 DIAGNOSIS — Z743 Need for continuous supervision: Secondary | ICD-10-CM | POA: Diagnosis not present

## 2019-08-01 DIAGNOSIS — R000 Tachycardia, unspecified: Secondary | ICD-10-CM | POA: Diagnosis not present

## 2019-08-03 DIAGNOSIS — L8995 Pressure ulcer of unspecified site, unstageable: Secondary | ICD-10-CM | POA: Diagnosis not present

## 2019-08-03 DIAGNOSIS — R1084 Generalized abdominal pain: Secondary | ICD-10-CM | POA: Diagnosis not present

## 2019-08-03 DIAGNOSIS — I10 Essential (primary) hypertension: Secondary | ICD-10-CM | POA: Diagnosis not present

## 2019-08-03 DIAGNOSIS — R000 Tachycardia, unspecified: Secondary | ICD-10-CM | POA: Diagnosis not present

## 2019-08-03 DIAGNOSIS — R52 Pain, unspecified: Secondary | ICD-10-CM | POA: Diagnosis not present

## 2019-08-12 DIAGNOSIS — F329 Major depressive disorder, single episode, unspecified: Secondary | ICD-10-CM | POA: Diagnosis not present

## 2019-08-12 DIAGNOSIS — R404 Transient alteration of awareness: Secondary | ICD-10-CM | POA: Diagnosis not present

## 2019-08-12 DIAGNOSIS — G8220 Paraplegia, unspecified: Secondary | ICD-10-CM | POA: Diagnosis not present

## 2019-08-12 DIAGNOSIS — N390 Urinary tract infection, site not specified: Secondary | ICD-10-CM | POA: Diagnosis not present

## 2019-08-12 DIAGNOSIS — R531 Weakness: Secondary | ICD-10-CM | POA: Diagnosis not present

## 2019-08-12 DIAGNOSIS — R41841 Cognitive communication deficit: Secondary | ICD-10-CM | POA: Diagnosis not present

## 2019-08-12 DIAGNOSIS — E440 Moderate protein-calorie malnutrition: Secondary | ICD-10-CM | POA: Diagnosis not present

## 2019-08-12 DIAGNOSIS — I10 Essential (primary) hypertension: Secondary | ICD-10-CM | POA: Diagnosis not present

## 2019-08-12 DIAGNOSIS — M6281 Muscle weakness (generalized): Secondary | ICD-10-CM | POA: Diagnosis not present

## 2019-08-12 DIAGNOSIS — L89620 Pressure ulcer of left heel, unstageable: Secondary | ICD-10-CM | POA: Diagnosis not present

## 2019-08-12 DIAGNOSIS — L89153 Pressure ulcer of sacral region, stage 3: Secondary | ICD-10-CM | POA: Diagnosis not present

## 2019-08-12 DIAGNOSIS — Z7401 Bed confinement status: Secondary | ICD-10-CM | POA: Diagnosis not present

## 2019-08-12 DIAGNOSIS — R262 Difficulty in walking, not elsewhere classified: Secondary | ICD-10-CM | POA: Diagnosis not present

## 2019-08-12 DIAGNOSIS — I739 Peripheral vascular disease, unspecified: Secondary | ICD-10-CM | POA: Diagnosis not present

## 2019-08-12 DIAGNOSIS — R1312 Dysphagia, oropharyngeal phase: Secondary | ICD-10-CM | POA: Diagnosis not present

## 2019-09-04 DEATH — deceased

## 2020-09-05 ENCOUNTER — Other Ambulatory Visit: Payer: Self-pay
